# Patient Record
Sex: Female | Born: 1985 | Race: Black or African American | Hispanic: No | Marital: Single | Smoking: Current every day smoker
Health system: Southern US, Community
[De-identification: ages and names within clinical notes are randomized; demographics above are authoritative.]

## PROBLEM LIST (undated history)

## (undated) ENCOUNTER — Inpatient Hospital Stay (HOSPITAL_COMMUNITY): Payer: Self-pay

## (undated) DIAGNOSIS — B009 Herpesviral infection, unspecified: Secondary | ICD-10-CM

## (undated) DIAGNOSIS — F430 Acute stress reaction: Secondary | ICD-10-CM

## (undated) DIAGNOSIS — N920 Excessive and frequent menstruation with regular cycle: Secondary | ICD-10-CM

## (undated) DIAGNOSIS — N946 Dysmenorrhea, unspecified: Secondary | ICD-10-CM

## (undated) DIAGNOSIS — N760 Acute vaginitis: Secondary | ICD-10-CM

## (undated) DIAGNOSIS — A749 Chlamydial infection, unspecified: Secondary | ICD-10-CM

## (undated) DIAGNOSIS — N898 Other specified noninflammatory disorders of vagina: Secondary | ICD-10-CM

## (undated) DIAGNOSIS — B9689 Other specified bacterial agents as the cause of diseases classified elsewhere: Secondary | ICD-10-CM

## (undated) DIAGNOSIS — F411 Generalized anxiety disorder: Secondary | ICD-10-CM

## (undated) DIAGNOSIS — I10 Essential (primary) hypertension: Secondary | ICD-10-CM

## (undated) DIAGNOSIS — R5383 Other fatigue: Secondary | ICD-10-CM

## (undated) DIAGNOSIS — F329 Major depressive disorder, single episode, unspecified: Secondary | ICD-10-CM

## (undated) HISTORY — DX: Other specified noninflammatory disorders of vagina: N89.8

## (undated) HISTORY — DX: Essential (primary) hypertension: I10

## (undated) HISTORY — DX: Acute vaginitis: N76.0

## (undated) HISTORY — DX: Other specified bacterial agents as the cause of diseases classified elsewhere: B96.89

## (undated) HISTORY — DX: Major depressive disorder, single episode, unspecified: F32.9

## (undated) HISTORY — DX: Other fatigue: R53.83

## (undated) HISTORY — DX: Excessive and frequent menstruation with regular cycle: N92.0

## (undated) HISTORY — PX: DILATION AND CURETTAGE OF UTERUS: SHX78

## (undated) HISTORY — PX: CHOLECYSTECTOMY: SHX55

## (undated) HISTORY — DX: Dysmenorrhea, unspecified: N94.6

---
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
## 2008-01-06 ENCOUNTER — Emergency Department (HOSPITAL_COMMUNITY): Admission: EM | Admit: 2008-01-06 | Discharge: 2008-01-06 | Payer: Self-pay | Admitting: Emergency Medicine

## 2009-03-27 ENCOUNTER — Emergency Department (HOSPITAL_COMMUNITY): Admission: EM | Admit: 2009-03-27 | Discharge: 2009-03-27 | Payer: Self-pay | Admitting: Emergency Medicine

## 2009-06-28 ENCOUNTER — Emergency Department (HOSPITAL_COMMUNITY): Admission: EM | Admit: 2009-06-28 | Discharge: 2009-06-28 | Payer: Self-pay | Admitting: Emergency Medicine

## 2010-05-18 ENCOUNTER — Encounter: Payer: Self-pay | Admitting: Unknown Physician Specialty

## 2010-05-29 ENCOUNTER — Inpatient Hospital Stay (HOSPITAL_COMMUNITY)
Admission: AD | Admit: 2010-05-29 | Discharge: 2010-05-29 | Disposition: A | Discharge status: Home / Self Care | Payer: Self-pay | Source: Ambulatory Visit | Attending: Obstetrics and Gynecology | Admitting: Obstetrics and Gynecology

## 2010-05-29 DIAGNOSIS — O9989 Other specified diseases and conditions complicating pregnancy, childbirth and the puerperium: Secondary | ICD-10-CM

## 2010-05-29 DIAGNOSIS — Y92009 Unspecified place in unspecified non-institutional (private) residence as the place of occurrence of the external cause: Secondary | ICD-10-CM | POA: Insufficient documentation

## 2010-05-29 DIAGNOSIS — O99891 Other specified diseases and conditions complicating pregnancy: Secondary | ICD-10-CM | POA: Insufficient documentation

## 2010-05-29 DIAGNOSIS — W108XXA Fall (on) (from) other stairs and steps, initial encounter: Secondary | ICD-10-CM | POA: Insufficient documentation

## 2010-05-29 LAB — URINALYSIS, ROUTINE W REFLEX MICROSCOPIC
Bilirubin Urine: NEGATIVE
Ketones, ur: 15 mg/dL — AB
Nitrite: NEGATIVE
Protein, ur: NEGATIVE mg/dL
Specific Gravity, Urine: 1.025 (ref 1.005–1.030)
Urobilinogen, UA: 0.2 mg/dL (ref 0.0–1.0)
pH: 6 (ref 5.0–8.0)

## 2010-05-29 LAB — HCG, QUANTITATIVE, PREGNANCY: hCG, Beta Chain, Quant, S: 4096 m[IU]/mL — ABNORMAL HIGH (ref <5)

## 2010-07-05 ENCOUNTER — Ambulatory Visit (HOSPITAL_COMMUNITY)
Admission: RE | Admit: 2010-07-05 | Discharge: 2010-07-05 | Disposition: A | Discharge status: Home / Self Care | Payer: Medicaid Other | Source: Ambulatory Visit | Attending: Obstetrics and Gynecology | Admitting: Obstetrics and Gynecology

## 2010-07-05 ENCOUNTER — Other Ambulatory Visit: Payer: Self-pay | Admitting: Obstetrics and Gynecology

## 2010-07-05 DIAGNOSIS — O021 Missed abortion: Secondary | ICD-10-CM | POA: Insufficient documentation

## 2010-07-05 LAB — CBC
HCT: 34.3 % — ABNORMAL LOW (ref 36.0–46.0)
Hemoglobin: 11.8 g/dL — ABNORMAL LOW (ref 12.0–15.0)
MCH: 26 pg (ref 26.0–34.0)
MCHC: 34.4 g/dL (ref 30.0–36.0)
MCV: 75.6 fL — ABNORMAL LOW (ref 78.0–100.0)
Platelets: 343 10*3/uL (ref 150–400)
RBC: 4.54 MIL/uL (ref 3.87–5.11)
RDW: 14.3 % (ref 11.5–15.5)
WBC: 5.6 10*3/uL (ref 4.0–10.5)

## 2010-07-06 LAB — RH IMMUNE GLOBULIN WORKUP (NOT WOMEN'S HOSP): Unit division: 0

## 2010-07-19 NOTE — Op Note (Signed)
  NAMEBRISEIDA, Ruth Mullen              ACCOUNT NO.:  [id number]  MEDICAL RECORD NO.:  [id number]           PATIENT TYPE:  O  LOCATION:  [facility]                          [facility]AC[facility]  PHYSICIAN:  Juluis Mire, M.D.   DATE OF BIRTH:  [birth year]  DATE OF PROCEDURE:  07/05/2010 DATE OF DISCHARGE:                              OPERATIVE REPORT   PREOPERATIVE DIAGNOSIS:  Nonviable first trimester pregnancy.  POSTOPERATIVE DIAGNOSIS:  Nonviable first trimester pregnancy.  PROCEDURE:  Paracervical block with dilatation and evacuation.  SURGEON:  Juluis Mire, MD.  ANESTHESIA:  MAC with paracervical block.  ESTIMATED BLOOD LOSS:  About 100 mL.  PACKS AND DRAINS:  None.  INTRAOPERATIVE BLOOD PLACED:  None.  COMPLICATIONS:  None.  INDICATIONS:  As dictated in the history and physical.  PROCEDURE:  The patient was taken to the OR and placed in supine position.  After satisfactory level of sedation, the patient placed in dorsal lithotomy position using the Allen stirrups.  At this point in time, the patient draped in sterile field.  Spec was placed in the vaginal vault.  The cervix was generally cleansed out with Betadine. Cervix was grasped with a single-tooth tenaculum.  A paracervical block was ensued using was 1% Nesacaine.  Uterus sounded to approximate 11 cm. Cervix serially dilated to a size 27 Pratt dilator.  A size 8 curved suction curette was introduced.  The intrauterine cavity was evacuated using suction curetting.  This was continued until no additional tissue was obtained.  We then sharply curetted feeling that all quadrants were clear with a gritty feel.  Repeat suction curetting revealed no additional tissue.  Uterus was contracting down well.  Bleeding was minimal.  At this point in time, single-tooth tenaculum speculum then removed.  The patient was taken out of the dorsal lithotomy position.  Once alert, transferred to recovery room in good condition.  The patient  will be given RhoGAM in recovery room.  Sponge, instrument, and needle count reported as correct by circulating nurse.     Juluis Mire, M.D.     JSM/MEDQ  D:  07/05/2010  T:  07/05/2010  Job:  [id number]  Electronically Signed by Richardean Chimera M.D. on 07/19/2010 06:05:32 AM

## 2010-07-19 NOTE — H&P (Signed)
  NAMENARMEEN, KERPER NO.:  [id number]  MEDICAL RECORD NO.:  [id number]           PATIENT TYPE:  LOCATION:                                 FACILITY:  PHYSICIAN:  Juluis Mire, M.D.   DATE OF BIRTH:  May 12, 1985  DATE OF ADMISSION:  07/03/2010 DATE OF DISCHARGE:                             HISTORY & PHYSICAL   The patient is a 25 year old gravida 3, para 1, abortus 1 female presents for D and E.  In relation to the present admission, the patient was sent in for new OB workup on July 02, 2010.  Fetal heart tones were not audible. Subsequent ultrasound revealed an intrauterine pregnancy with no cardiac activity.  Crown-rump length was consistent with 8 weeks and 1 day. Therefore, she was diagnosed with a nonviable first trimester pregnancy, presents for D and E.  The patient's blood type is B negative.  RhoGAM will be required.  In terms of allergies, the patient has no known no known drug allergies.  MEDICATIONS:  Prenatal vitamins.  PAST MEDICAL HISTORY:  Usual childhood disease.  No sequelae.  PAST SURGICAL HISTORY:  She has had her gallbladder removed.  She has had one vaginal delivery, one TAB.  SOCIAL HISTORY:  No tobacco or alcohol use.  FAMILY HISTORY:  Noncontributory.  REVIEW OF SYSTEMS:  Noncontributory.  PHYSICAL EXAM:  VITAL SIGNS:  Stable.  The patient is afebrile. HEENT:  The patient is normocephalic.  Pupils equal, round, reactive to light, and accommodation.  Extraocular movements were intact.  Sclerae and conjunctivae are clear.  Oropharynx is clear. NECK:  No thyromegaly. BREASTS:  No dominant masses noted. LUNGS:  Clear. CARDIOVASCULAR:  Regular rate.  There are no murmurs or gallops. ABDOMEN:  Benign.  No mass, organomegaly, or tenderness. PELVIC:  Normal external genitalia.  Vaginal mucosa is clear.  Cervix unremarkable.  Uterus 9 weeks in size.  Adnexa unremarkable. EXTREMITIES:  Trace edema. NEUROLOGIC:  Grossly within  normal limits.  IMPRESSION:  Nonviable first trimester pregnancy.  PLAN:  The patient will undergo dilatation and evacuation.  The risks of surgery have been discussed including the risk of infection.  Risk of hemorrhage that could require transfusion with the risk of AIDS or hepatitis.  Excessive bleeding could require hysterectomy. There is a risk of injury to adjacent organs through perforation that could require further exploratory surgery.  Risk of deep venous thrombosis and pulmonary embolus.  The patient does understand indications and potential risks.     Juluis Mire, M.D.     JSM/MEDQ  D:  07/03/2010  T:  07/03/2010  Job:  [id number]  Electronically Signed by Richardean Chimera M.D. on 07/19/2010 06:05:30 AM

## 2010-07-20 LAB — RAPID STREP SCREEN (MED CTR MEBANE ONLY): Streptococcus, Group A Screen (Direct): NEGATIVE

## 2010-07-27 DEATH — deceased

## 2010-07-29 LAB — URINALYSIS, ROUTINE W REFLEX MICROSCOPIC
Glucose, UA: NEGATIVE mg/dL
Ketones, ur: >80 mg/dL — AB
Leukocytes, UA: NEGATIVE
Nitrite: NEGATIVE
Specific Gravity, Urine: >1.03 — ABNORMAL HIGH (ref 1.005–1.030)
Urobilinogen, UA: 0.2 mg/dL (ref 0.0–1.0)
pH: 6 (ref 5.0–8.0)

## 2010-07-29 LAB — DIFFERENTIAL
Basophils Absolute: 0 10*3/uL (ref 0.0–0.1)
Basophils Relative: 1 % (ref 0–1)
Eosinophils Absolute: 0 10*3/uL (ref 0.0–0.7)
Eosinophils Relative: 1 % (ref 0–5)
Lymphs Abs: 2.5 10*3/uL (ref 0.7–4.0)
Neutrophils Relative %: 51 % (ref 43–77)

## 2010-07-29 LAB — URINE MICROSCOPIC-ADD ON

## 2010-07-29 LAB — GLUCOSE, CAPILLARY: Glucose-Capillary: 117 mg/dL — ABNORMAL HIGH (ref 70–99)

## 2010-07-29 LAB — COMPREHENSIVE METABOLIC PANEL
ALT: 11 U/L (ref 0–35)
AST: 20 U/L (ref 0–37)
CO2: 26 mEq/L (ref 19–32)
Calcium: 9.3 mg/dL (ref 8.4–10.5)
Chloride: 101 mEq/L (ref 96–112)
GFR calc Af Amer: >60 mL/min (ref >60)
GFR calc non Af Amer: >60 mL/min (ref >60)
Glucose, Bld: 65 mg/dL — ABNORMAL LOW (ref 70–99)
Sodium: 137 mEq/L (ref 135–145)
Total Bilirubin: 1.2 mg/dL (ref 0.3–1.2)

## 2010-07-29 LAB — CBC
Hemoglobin: 13.1 g/dL (ref 12.0–15.0)
MCHC: 33.9 g/dL (ref 30.0–36.0)
MCV: 77.4 fL — ABNORMAL LOW (ref 78.0–100.0)
RBC: 4.99 MIL/uL (ref 3.87–5.11)
WBC: 5.7 10*3/uL (ref 4.0–10.5)

## 2010-07-29 LAB — WET PREP, GENITAL
Trich, Wet Prep: NONE SEEN
Yeast Wet Prep HPF POC: NONE SEEN

## 2010-07-29 LAB — PREGNANCY, URINE: Preg Test, Ur: NEGATIVE

## 2010-07-29 LAB — GC/CHLAMYDIA PROBE AMP, GENITAL
Chlamydia, DNA Probe: NEGATIVE
GC Probe Amp, Genital: NEGATIVE

## 2010-09-12 NOTE — Op Note (Signed)
NAMEDAVIS, VANNATTER              ACCOUNT NO.:  [id number]   MEDICAL RECORD NO.:  [id number]          PATIENT TYPE:  INP   LOCATION:  1517                         FACILITY:  [facility]   PHYSICIAN:  Thornton Park. Daphine Deutscher, MD  DATE OF BIRTH:  1985/11/29   DATE OF PROCEDURE:  06/03/2005  DATE OF DISCHARGE:                                 OPERATIVE REPORT   PREOPERATIVE DIAGNOSES:  Cholecystitis, cholelithiasis.   POSTOPERATIVE DIAGNOSES:  Cholecystitis, cholelithiasis.   PROCEDURE:  Laparoscopic cholecystectomy with intraoperative cholangiogram.   SURGEON:  Thornton Park. Daphine Deutscher, MD.   ASSISTANT:  Leonie Man, MD.   ANESTHESIA:  General endotracheal.   ESTIMATED BLOOD LOSS:  Minimal.   DESCRIPTION OF PROCEDURE:  The patient was taken to room 11 on the evening  of June 03, 2005, given general anesthesia. The abdomen was prepped with  chlorhexidine and draped sterilely. A longitudinal incision was made down to  the umbilicus which I had anesthetized with lidocaine and I entered the  abdomen without difficulty. A suture of #0 Vicryl was placed to hold the  Hassan cannula in place. After insufflation, anesthesia was used in each of  the wounds and then a 10 mm placed in the upper midline and two 5s  laterally. The gallbladder was grasped and elevated. I used a hook  electrocautery to dissect free Calot's triangle. She had a modest degree of  inflammatory changes here that appeared chronic. I skeletonized the cystic  duct and the cystic artery and put clips upon the cystic artery and the  cystic duct at the cystic gallbladder junction. I incised the cystic duct  then inserted the Reddick catheter and did a dynamic cholangiogram which  showed good filling of the intrahepatic radicals and free flow into the  duodenum. I triple clipped the cystic duct, divided it and then removed the  gallbladder from the gallbladder bed using hook electrocautery without  entering it. It was then brought out  through the umbilicus in a bag. The  umbilical defect was repaired with two horizontal simple sutures of #0  Vicryl. This was done under laparoscopic vision. The gallbladder bed was  irrigated and no bleeding or bile leaks were noted. The trocars were all  withdrawn and the wounds were closed with 4-0 Vicryl, Benzoin and Steri-  Strips. The patient tolerated the procedure well and was taken to the  recovery room in satisfactory condition.      Thornton Park Daphine Deutscher, MD  Electronically Signed     MBM/MEDQ  D:  06/03/2005  T:  06/04/2005  Job:  [id number]

## 2010-09-12 NOTE — H&P (Signed)
Ruth Mullen, Ruth Mullen              ACCOUNT NO.:  [id number]   MEDICAL RECORD NO.:  [id number]          PATIENT TYPE:  INP   LOCATION:  1517                         FACILITY:  [facility]   PHYSICIAN:  Velora Heckler, MD      DATE OF BIRTH:  1985-11-02   DATE OF ADMISSION:  06/01/2005  DATE OF DISCHARGE:                                HISTORY & PHYSICAL   CHIEF COMPLAINT:  Abdominal pain, nausea.   HISTORY OF PRESENT ILLNESS:  Ruth Mullen is a black female from  [place], [place], who presents to the emergency department  accompanied by friends and multiple family members for intermittent  abdominal pain. The patient has a 86-month history of intermittent right  upper quadrant abdominal pain, nausea, and fever. She was initially seen by  her primary physician and started on Protonix. She did not have symptomatic  relief. She has been in and out of the emergency departments both in  [place] and in [place]. The patient contacted our office this morning  for evaluation at the instructions of the emergency department. Due to the  acuity of her symptoms, she was referred to [facility] Room where  she is now seen by surgery for evaluation of known cholelithiasis. The  patient has had a previous ultrasound of the abdomen. This was performed on  February2,2007, at [facility]. It documented multiple  small gallstones. There was slight thickening of the gallbladder wall of 3  mm. There was no pericholecystic fluid. There was no biliary dilatation. The  patient now presents with persistent abdominal pain for 5 days, and surgery  is called to evaluate and manage.   PAST MEDICAL HISTORY:  History of childbirth in 2006, no prior surgery.   MEDICATIONS:  Protonix 40 mg daily.   ALLERGIES:  No known drug allergies.   SOCIAL HISTORY:  The patient denies tobacco or alcohol use. She is  accompanied by mother, her grandmother, and her boyfriend. She also has  her  38-month-old child with her. She denies tobacco use. She denies alcohol use.  She is unemployed.   FAMILY HISTORY:  Noncontributory. No history of biliary disease or  gallbladder surgery in any family members.   REVIEW OF SYSTEMS:  A 15-system review discussed with the patient and family  without significant other findings.   PHYSICAL EXAMINATION:  GENERAL:  25 year old well-developed, well-nourished  black female on a stretcher. Ill.  VITAL SIGNS: Temperature 99, pulse 71, respirations 18, blood pressure  100/67.  HEENT: Shows to be normocephalic, atraumatic. Sclerae clear. Conjunctiva  clear. Pupils equal and reactive. Dentition fair. Mucous membranes moist.  Voice normal.  NECK: Palpation of the neck shows no thyroid nodularity. There is no  lymphadenopathy. There are no masses. There is no tenderness.  LUNGS: Clear to auscultation bilaterally, although the patient does  experience right upper quadrant abdominal pain with deep inspiration.  CARDIAC:  Exam shows regular rate and rhythm without murmur. Peripheral  pulses are full.  ABDOMEN: Soft without distension. Bowel sounds are present. There are no  surgical wounds. There are some striae on the  lower abdominal wall given a  history of childbirth. There is mild tenderness to palpation in the right  upper quadrant. There is no palpable mass. There is no pedal splenomegaly.  There is no obvious Murphy's sign present.  EXTREMITIES:  Nontender without edema.  NEUROLOGICALLY:  The patient is alert and oriented without focal neurologic  deficit.   LABORATORY STUDIES:  White count 4.9, hemoglobin 14.9, platelet count  361,000. Differential was notable for a low neutrophil count of 26%,  elevated lymphocyte count of 67%. Electrolytes were normal. Renal function  is normal. Liver function test show normal total bilirubin of 1.2 and  slightly elevated SGPT of 54 and normal. Lipase level of 24.   RADIOGRAPHIC STUDIES:  Ultrasound  from [place] showing multiple gallstones  and slight thickening of the gallbladder wall.   IMPRESSION:  Biliary colic, cholelithiasis.   PLAN:  1.  Admission to [facility].  2.  Preparation for surgery.  3.  To operating room on February6 for cholecystectomy with intraoperative      cholangiography.  4.  Routine postoperative care.      Velora Heckler, MD  Electronically Signed     TMG/MEDQ  D:  06/01/2005  T:  06/01/2005  Job:  [id number]   cc:   Marcene Duos, M.D.  Fax: [phone]

## 2010-09-12 NOTE — Discharge Summary (Signed)
Ruth Mullen, Ruth Mullen              ACCOUNT NO.:  [id number]   MEDICAL RECORD NO.:  [id number]          PATIENT TYPE:  INP   LOCATION:  1517                         FACILITY:  [facility]   PHYSICIAN:  Thornton Park. Daphine Deutscher, MD  DATE OF BIRTH:  07-Aug-1985   DATE OF ADMISSION:  06/01/2005  DATE OF DISCHARGE:  06/04/2005                                 DISCHARGE SUMMARY   ADMISSION DIAGNOSES:  1.  Abdominal pain.  2.  Nausea, vomiting.  3.  Gallstones.   DISCHARGE DIAGNOSES:  Chronic cholecystitis with multiple gallstones.   PROCEDURES:  Laparoscopic cholecystectomy, intraoperative cholangiogram on  June 03, 2005, by Dr. Rolan Bucco.   HOSPITAL COURSE:  This is a 25 year old black female who presented with a  four month history of intermittent right upper quadrant abdominal pain,  nausea and fever.  She had gallstones seen on an ultrasound.  She was  admitted and attempts were made to try to get her to the operating room, but  because of OR availability, that was not done initially.  This was delayed  until June 03, 2005, at which time on the evening of June 03, 2005,  she underwent a laparoscopic cholecystectomy with intraoperative  cholangiogram by Dr. Daphine Deutscher.  She was doing well on June 04, 2005, and  ready for discharge.  She was given a prescription for Vicodin (25) to take  for pain, and asked to return to the office in two weeks.   CONDITION ON DISCHARGE:  Good.   FINAL DIAGNOSES:  Chronic cholecystitis/cholelithiasis.      Thornton Park Daphine Deutscher, MD  Electronically Signed     MBM/MEDQ  D:  06/04/2005  T:  06/04/2005  Job:  [id number]   cc:   Marcene Duos, M.D.  Fax: [phone]

## 2010-10-20 ENCOUNTER — Emergency Department (HOSPITAL_COMMUNITY)
Admission: EM | Admit: 2010-10-20 | Discharge: 2010-10-20 | Disposition: A | Discharge status: Home / Self Care | Payer: Medicaid Other | Attending: Emergency Medicine | Admitting: Emergency Medicine

## 2010-10-20 DIAGNOSIS — R296 Repeated falls: Secondary | ICD-10-CM | POA: Insufficient documentation

## 2010-10-20 DIAGNOSIS — IMO0002 Reserved for concepts with insufficient information to code with codable children: Secondary | ICD-10-CM | POA: Insufficient documentation

## 2010-12-05 ENCOUNTER — Emergency Department (HOSPITAL_COMMUNITY)
Admission: EM | Admit: 2010-12-05 | Discharge: 2010-12-05 | Disposition: A | Discharge status: Home / Self Care | Payer: Medicaid Other | Attending: Emergency Medicine | Admitting: Emergency Medicine

## 2010-12-05 DIAGNOSIS — F1010 Alcohol abuse, uncomplicated: Secondary | ICD-10-CM | POA: Insufficient documentation

## 2010-12-05 LAB — DIFFERENTIAL
Basophils Absolute: 0 10*3/uL (ref 0.0–0.1)
Basophils Relative: 1 % (ref 0–1)
Eosinophils Absolute: 0 10*3/uL (ref 0.0–0.7)
Eosinophils Relative: 1 % (ref 0–5)
Lymphocytes Relative: 48 % — ABNORMAL HIGH (ref 12–46)
Monocytes Absolute: 0.2 10*3/uL (ref 0.1–1.0)

## 2010-12-05 LAB — URINALYSIS, ROUTINE W REFLEX MICROSCOPIC
Hgb urine dipstick: NEGATIVE
Nitrite: NEGATIVE
Protein, ur: NEGATIVE mg/dL
Specific Gravity, Urine: 1.015 (ref 1.005–1.030)
Urobilinogen, UA: 1 mg/dL (ref 0.0–1.0)

## 2010-12-05 LAB — POCT PREGNANCY, URINE: Preg Test, Ur: NEGATIVE

## 2010-12-05 LAB — BASIC METABOLIC PANEL
CO2: 23 mEq/L (ref 19–32)
Calcium: 9.1 mg/dL (ref 8.4–10.5)
Creatinine, Ser: 0.61 mg/dL (ref 0.50–1.10)
GFR calc non Af Amer: >60 mL/min (ref >60)
Glucose, Bld: 106 mg/dL — ABNORMAL HIGH (ref 70–99)

## 2010-12-05 LAB — CBC
HCT: 37.2 % (ref 36.0–46.0)
MCHC: 34.9 g/dL (ref 30.0–36.0)
Platelets: 330 10*3/uL (ref 150–400)
RDW: 14 % (ref 11.5–15.5)
WBC: 6.6 10*3/uL (ref 4.0–10.5)

## 2010-12-05 LAB — RAPID URINE DRUG SCREEN, HOSP PERFORMED
Barbiturates: NOT DETECTED
Opiates: NOT DETECTED
Tetrahydrocannabinol: POSITIVE — AB

## 2010-12-05 LAB — ETHANOL: Alcohol, Ethyl (B): 168 mg/dL — ABNORMAL HIGH (ref 0–11)

## 2010-12-31 IMAGING — US US TRANSVAGINAL NON-OB
1 series · 14 of 25 positions shown · non-contrast
Comparison: None.

CLINICAL DATA: Lower abdominal pain and heavy vaginal bleeding.

TRANSVAGINAL ULTRASOUND OF PELVIS,ULTRASOUND PELVIS COMPLETE -
MODIFY
TECHNIQUE: Routine.

[Series 1: us transvaginal non-ob · 0.20mm/px · 14 of 49 slices shown]
[im 1/49]
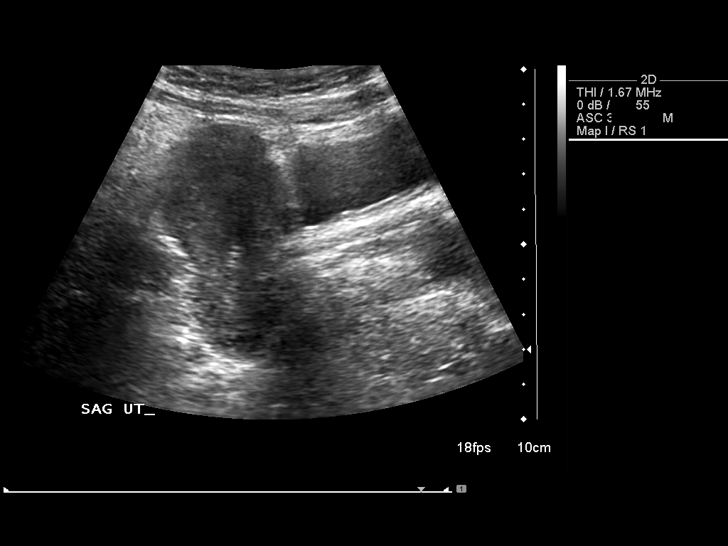
[im 5/49]
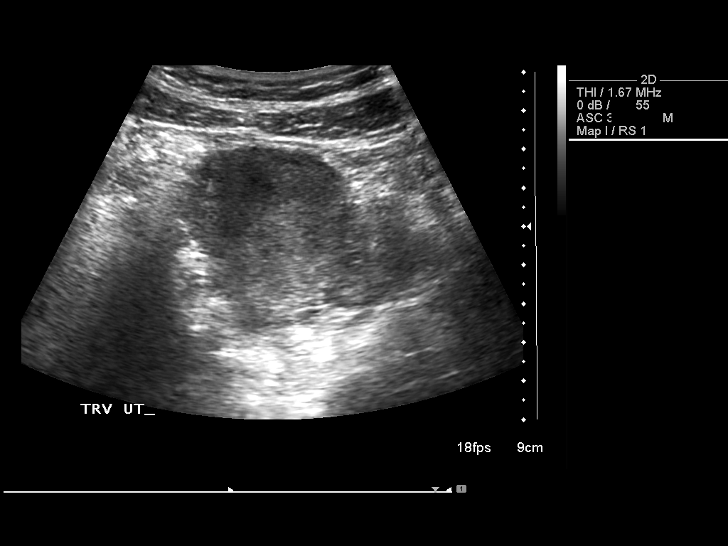
[im 9/49]
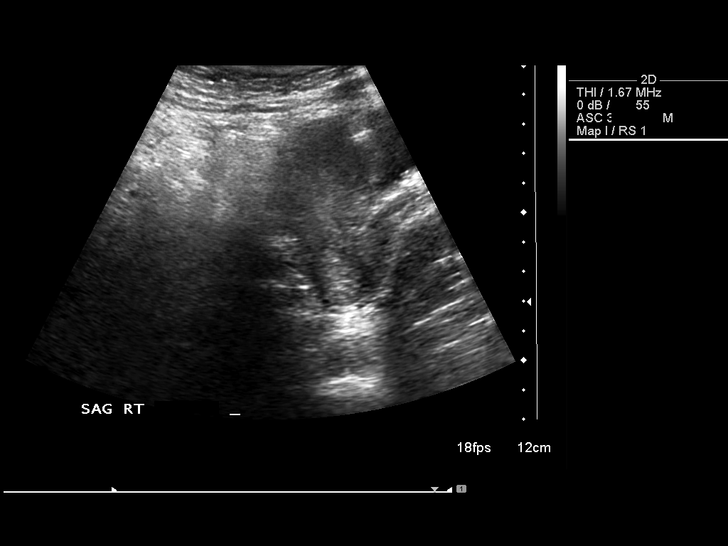
[im 13/49]
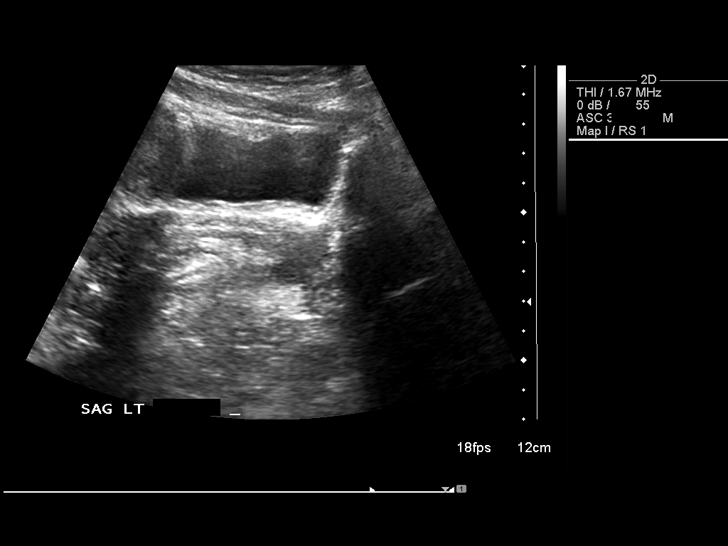
[im 17/49]
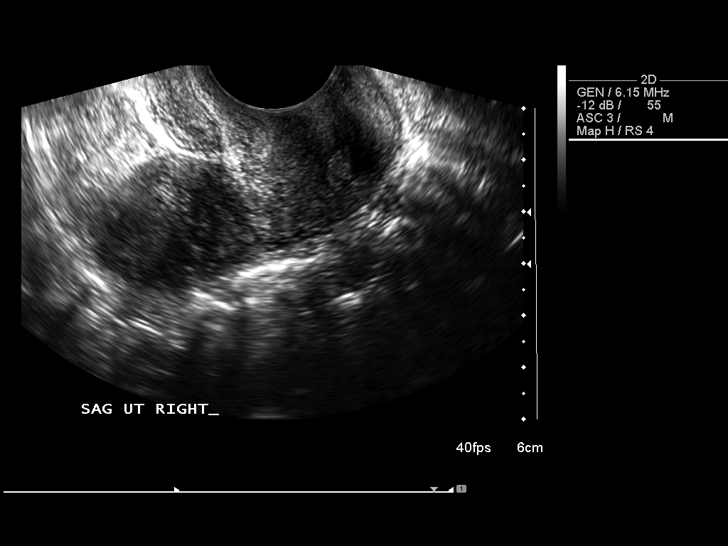
[im 19/49]
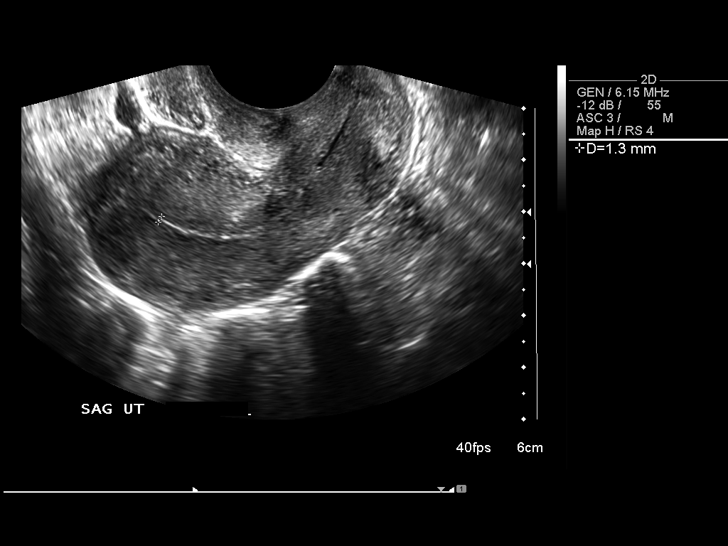
[im 23/49]
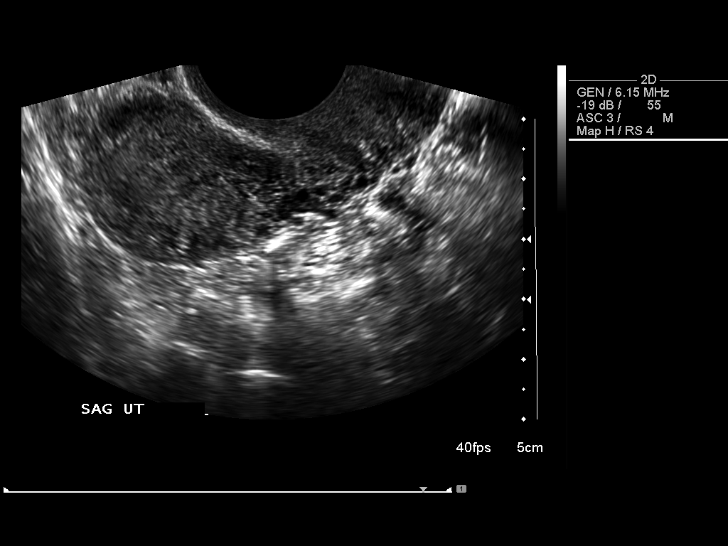
[im 27/49]
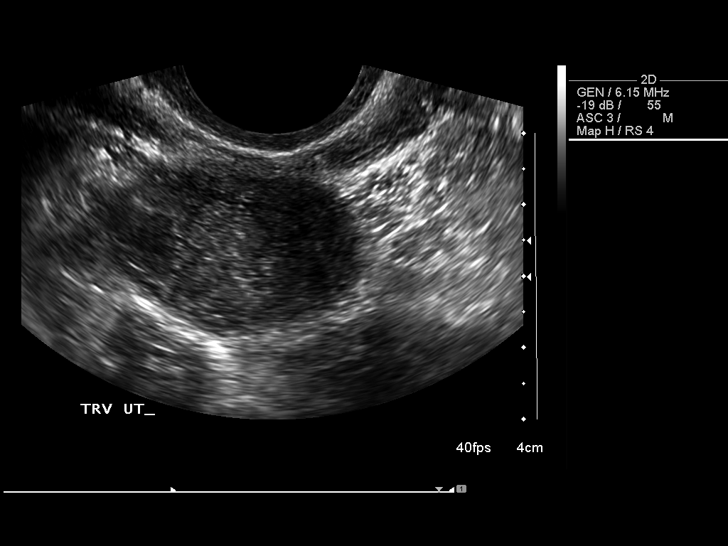
[im 31/49]
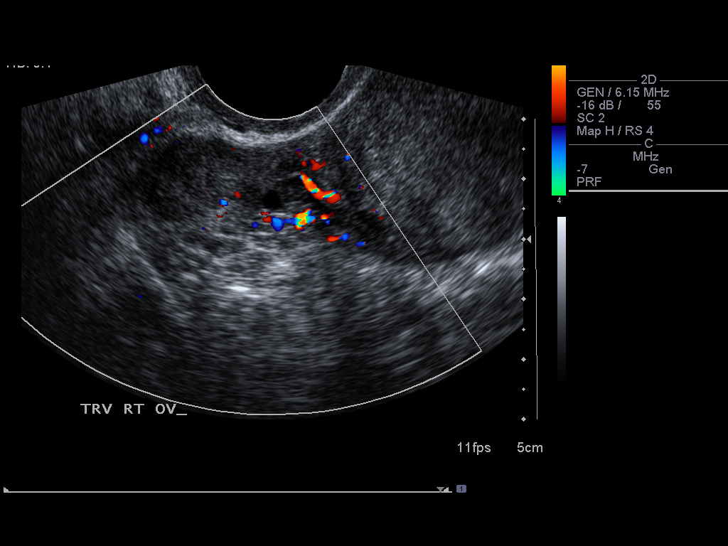
[im 33/49]
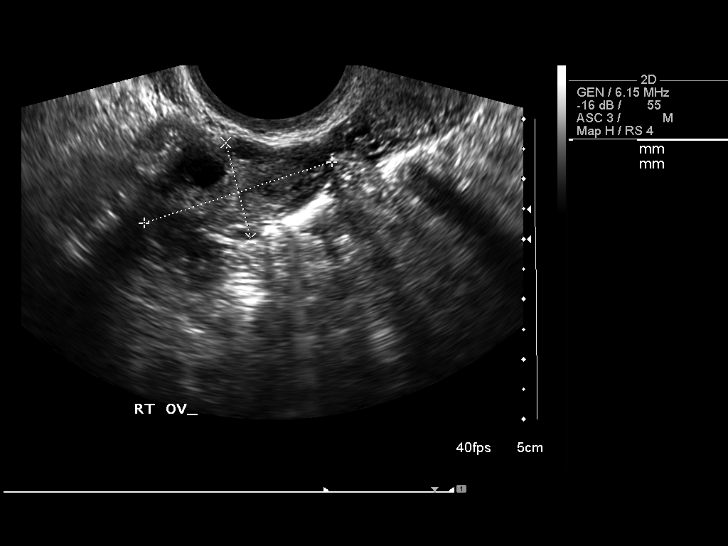
[im 37/49]
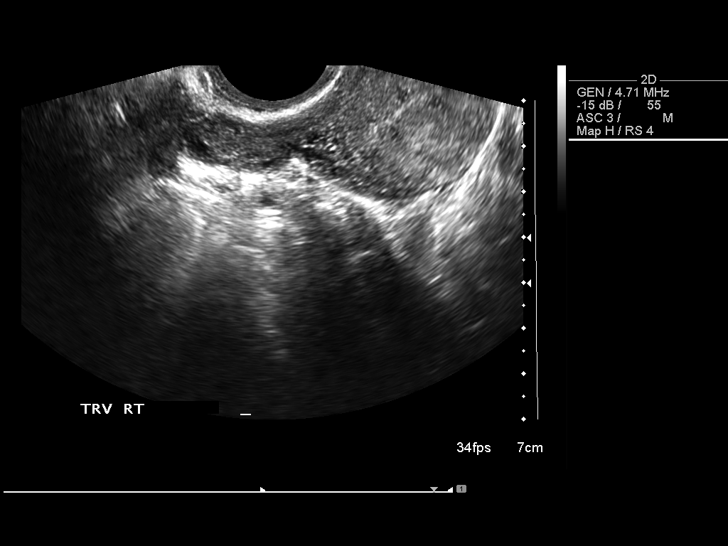
[im 41/49]
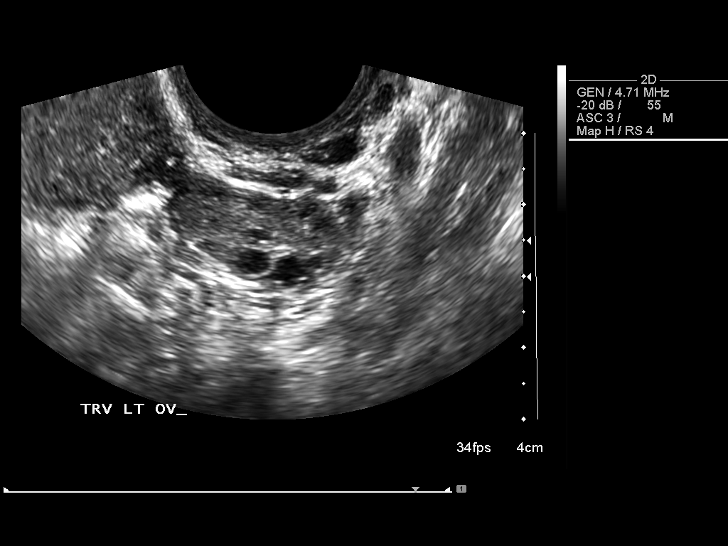
[im 45/49]
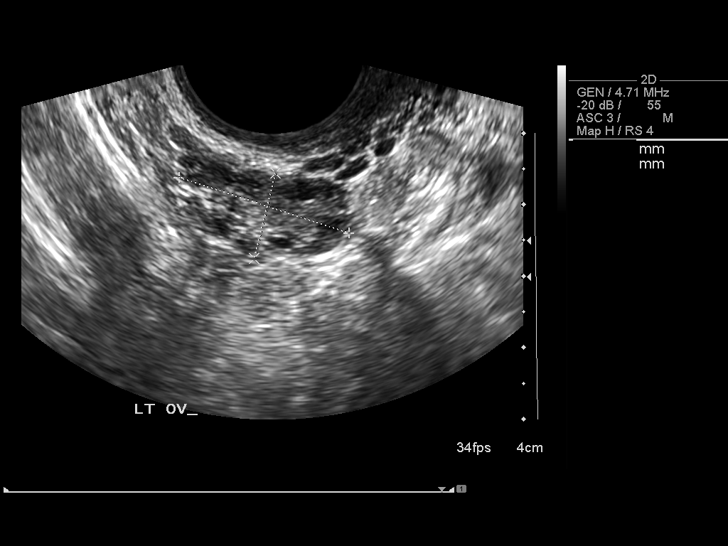
[im 49/49]
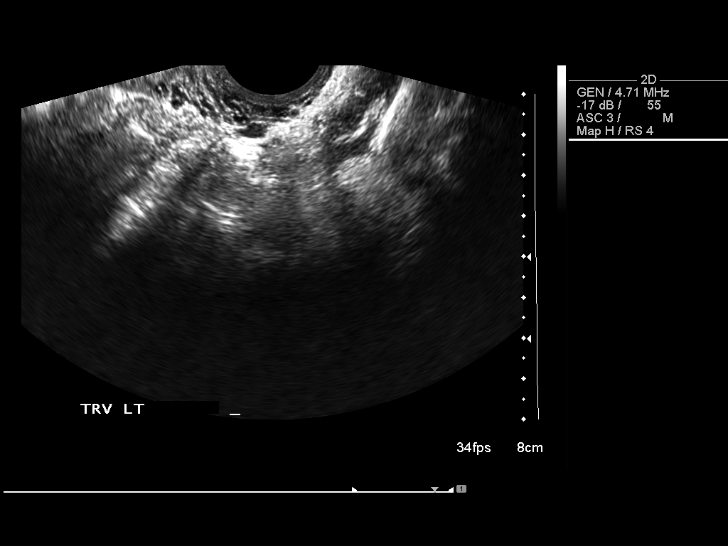

[14 of 25 positions shown; findings below may reference images not displayed]

FINDINGS: Uterine size and contour normal.  No lesions of the
myometrium.

Endometrium is thin and homogeneous.  There is a small amount of
fluid in the endocervical canal, which is likely due to vaginal
bleeding.

The right ovary is 3.3 x 1.7 x 2.8 cm.  The left is 2.5 x 1.2 x
cm.  There are bilateral ovarian follicles with no dominant cysts
or solid masses.  No pelvic fluid is identified.
IMPRESSION: Normal except for a small amount of endocervical fluid, likely due
to vaginal bleeding.

## 2011-01-28 LAB — URINALYSIS, ROUTINE W REFLEX MICROSCOPIC
Protein, ur: 100 — AB
Specific Gravity, Urine: 1.02
Urobilinogen, UA: 0.2

## 2011-01-28 LAB — URINE MICROSCOPIC-ADD ON

## 2011-02-06 LAB — URINALYSIS, ROUTINE W REFLEX MICROSCOPIC
Bilirubin Urine: NEGATIVE
Glucose, UA: NEGATIVE
Hgb urine dipstick: NEGATIVE
Ketones, ur: NEGATIVE
pH: 8

## 2012-01-12 ENCOUNTER — Inpatient Hospital Stay (HOSPITAL_COMMUNITY)
Admission: AD | Admit: 2012-01-12 | Discharge: 2012-01-13 | Disposition: A | Discharge status: Home / Self Care | Payer: Self-pay | Source: Ambulatory Visit | Attending: Obstetrics and Gynecology | Admitting: Obstetrics and Gynecology

## 2012-01-12 ENCOUNTER — Encounter (HOSPITAL_COMMUNITY): Payer: Self-pay | Admitting: *Deleted

## 2012-01-12 DIAGNOSIS — Z202 Contact with and (suspected) exposure to infections with a predominantly sexual mode of transmission: Secondary | ICD-10-CM | POA: Insufficient documentation

## 2012-01-12 DIAGNOSIS — A5901 Trichomonal vulvovaginitis: Secondary | ICD-10-CM | POA: Insufficient documentation

## 2012-01-12 DIAGNOSIS — N949 Unspecified condition associated with female genital organs and menstrual cycle: Secondary | ICD-10-CM | POA: Insufficient documentation

## 2012-01-12 LAB — URINE MICROSCOPIC-ADD ON

## 2012-01-12 LAB — URINALYSIS, ROUTINE W REFLEX MICROSCOPIC
Bilirubin Urine: NEGATIVE
Glucose, UA: NEGATIVE mg/dL
Nitrite: NEGATIVE
Specific Gravity, Urine: 1.01 (ref 1.005–1.030)
pH: 7 (ref 5.0–8.0)

## 2012-01-12 LAB — WET PREP, GENITAL: Yeast Wet Prep HPF POC: NONE SEEN

## 2012-01-12 NOTE — MAU Note (Signed)
Pt reports bumps on vaginal area today, discharge x 3 weeks, clear watery discharge. LMP 12/23/2011

## 2012-01-12 NOTE — MAU Note (Signed)
House coverage out to speak with pt about delay. Pt okay

## 2012-01-13 DIAGNOSIS — N9489 Other specified conditions associated with female genital organs and menstrual cycle: Secondary | ICD-10-CM

## 2012-01-13 LAB — GC/CHLAMYDIA PROBE AMP, GENITAL: GC Probe Amp, Genital: NEGATIVE

## 2012-01-13 MED ORDER — CEFTRIAXONE SODIUM 250 MG IJ SOLR
250.0000 mg | Freq: Once | INTRAMUSCULAR | Status: AC
  Administered 2012-01-13: 250 mg via INTRAMUSCULAR
  Filled 2012-01-13: qty 250

## 2012-01-13 MED ORDER — METRONIDAZOLE 500 MG PO TABS
2000.0000 mg | ORAL_TABLET | Freq: Once | ORAL | Status: AC
  Administered 2012-01-13: 2000 mg via ORAL
  Filled 2012-01-13: qty 4

## 2012-01-13 MED ORDER — AZITHROMYCIN 250 MG PO TABS
1000.0000 mg | ORAL_TABLET | Freq: Once | ORAL | Status: AC
  Administered 2012-01-13: 1000 mg via ORAL
  Filled 2012-01-13: qty 4

## 2012-01-13 MED ORDER — VALACYCLOVIR HCL 1 G PO TABS: 1000.0000 mg | ORAL_TABLET | Freq: Two times a day (BID) | ORAL | Status: DC

## 2012-01-13 MED ORDER — ONDANSETRON HCL 4 MG PO TABS
4.0000 mg | ORAL_TABLET | Freq: Once | ORAL | Status: AC
  Administered 2012-01-13: 4 mg via ORAL
  Filled 2012-01-13: qty 1

## 2012-01-13 NOTE — MAU Provider Note (Signed)
Chief Complaint: Vaginal Discharge   First Provider Initiated Contact with Patient 01/12/12 2311     SUBJECTIVE HPI: Ruth Mullen is a 26 y.o. [id number] who presents to MAU reporting  bumps on vaginal area today, discharge x 3 weeks, clear watery discharge. LMP 12/23/2011. Denies fever, chills, abd pain, burning or tingling of bumps. Pt shaves genital area. States bumps hurt when her underwear rub. Patient is sexually active.  Past Medical History  Diagnosis Date  . No pertinent past medical history    OB History    Grav Para Term Preterm Abortions TAB SAB Ect Mult Living   3 1   2 1 1   1      # Outc Date GA Lbr Len/2nd Wgt Sex Del Anes PTL Lv   1 PAR            2 SAB            3 TAB              Past Surgical History  Procedure Date  . Dilation and curettage of uterus   . Cholecystectomy    History   Social History  . Marital Status: Single    Spouse Name: N/A    Number of Children: N/A  . Years of Education: N/A   Occupational History  . Not on file.   Social History Main Topics  . Smoking status: Current Every Day Smoker  . Smokeless tobacco: Not on file  . Alcohol Use: No  . Drug Use: No  . Sexually Active: Yes   Other Topics Concern  . Not on file   Social History Narrative  . No narrative on file   No current facility-administered medications on file prior to encounter.   No current outpatient prescriptions on file prior to encounter.   No Known Allergies  ROS: Pertinent items in HPI  OBJECTIVE Blood pressure 139/84, pulse 86, temperature 98.7 F (37.1 C), temperature source Oral, resp. rate 16, height 5' (1.524 m), weight 52.164 kg (115 lb), last menstrual period 12/23/2011, SpO2 100.00%. GENERAL: Well-developed, well-nourished female in no acute distress.  HEENT: Normocephalic HEART: normal rate RESP: normal effort ABDOMEN: Soft, non-tender EXTREMITIES: Nontender, no edema NEURO: Alert and oriented SPECULUM EXAM: Approximately 8 intact,  tender vesicles on bilateral labia minora, large amount of thin, malodorous, pale yellow discharge, no blood noted, cervix friable. No mucopurulent discharge noted. BIMANUAL: cervix closed; uterus normal size, no CMT, adnexal tenderness or masses  LAB RESULTS Results for orders placed during the hospital encounter of 01/12/12 (from the past 24 hour(s))  URINALYSIS, ROUTINE W REFLEX MICROSCOPIC     Status: Abnormal   Collection Time   01/12/12  7:40 PM      Component Value Range   Color, Urine YELLOW  YELLOW   APPearance CLEAR  CLEAR   Specific Gravity, Urine 1.010  1.005 - 1.030   pH 7.0  5.0 - 8.0   Glucose, UA NEGATIVE  NEGATIVE mg/dL   Hgb urine dipstick TRACE (*) NEGATIVE   Bilirubin Urine NEGATIVE  NEGATIVE   Ketones, ur NEGATIVE  NEGATIVE mg/dL   Protein, ur NEGATIVE  NEGATIVE mg/dL   Urobilinogen, UA 0.2  0.0 - 1.0 mg/dL   Nitrite NEGATIVE  NEGATIVE   Leukocytes, UA SMALL (*) NEGATIVE  URINE MICROSCOPIC-ADD ON     Status: Normal   Collection Time   01/12/12  7:40 PM      Component Value Range   Squamous Epithelial /  LPF RARE  RARE   WBC, UA 3-6  <3 WBC/hpf   RBC / HPF 0-2  <3 RBC/hpf   Bacteria, UA RARE  RARE  POCT PREGNANCY, URINE     Status: Normal   Collection Time   01/12/12  8:23 PM      Component Value Range   Preg Test, Ur NEGATIVE  NEGATIVE  WET PREP, GENITAL     Status: Abnormal   Collection Time   01/12/12 11:06 PM      Component Value Range   Yeast Wet Prep HPF POC NONE SEEN  NONE SEEN   Trich, Wet Prep FEW (*) NONE SEEN   Clue Cells Wet Prep HPF POC MODERATE (*) NONE SEEN   WBC, Wet Prep HPF POC MODERATE (*) NONE SEEN    IMAGING No results found.  ED COURSE Discussed diagnosis of Trichomonas and possible exposure to other sexual transmitted diseases. Vulvar lesions suspicious for herpes. Patient would like prophylactic treatment for gonorrhea and Chlamydia. Flagyl, and azithromycin, Rocephin and Zofran given.  ASSESSMENT 1. Genital lesion, female     2. Trichomonal vaginitis   3. Exposure to STD    PLAN Discharge home Herpes culture,, gonorrhea and Chlamydia cultures pending. No intercourse until one week after patient and partner treated. Always use condoms. Additional STD testing [facility] be performed at [facility] or [facility]. Follow-up Information    Follow up with [facility] [place], [place].. (As needed if symptoms worsen)    Contact information:   [address] [place] [address] [phone]          Medication List     As of 01/14/2012  1:42 AM    TAKE these medications         ibuprofen 100 MG tablet   Commonly known as: ADVIL,MOTRIN   Take 100 mg by mouth every 6 (six) hours as needed. headache      valACYclovir 1000 MG tablet   Commonly known as: VALTREX   Take 1 tablet (1,000 mg total) by mouth 2 (two) times daily. X 7-10 days for first outbreak, then 1000 mg per day x 5 days for recurrence.         [place], CNM 01/13/2012  12:52 AM

## 2012-01-14 LAB — HERPES SIMPLEX VIRUS CULTURE
Culture: DETECTED
Special Requests: NORMAL

## 2012-01-26 ENCOUNTER — Telehealth: Payer: Self-pay | Admitting: Medical

## 2012-01-26 DIAGNOSIS — A6000 Herpesviral infection of urogenital system, unspecified: Secondary | ICD-10-CM

## 2012-01-26 MED ORDER — VALACYCLOVIR HCL 1 G PO TABS: 1000.0000 mg | ORAL_TABLET | Freq: Two times a day (BID) | ORAL | Status: DC

## 2012-01-26 NOTE — Telephone Encounter (Signed)
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 Take Valtrex as directed (has Rx) and use condoms.

## 2012-01-26 NOTE — Telephone Encounter (Signed)
Contacted patient. Informed her that culture was positive and that she should take the valtrex as directed and use condoms during intercourse. Patient voiced understanding, but states that she lost the Rx. I will resend to her pharmacy ([facility] in [place]) for her to pick up today. The patient did not voice any further questions at this time.

## 2012-08-21 ENCOUNTER — Encounter (HOSPITAL_COMMUNITY): Payer: Self-pay | Admitting: *Deleted

## 2012-08-21 ENCOUNTER — Emergency Department (HOSPITAL_COMMUNITY)
Admission: EM | Admit: 2012-08-21 | Discharge: 2012-08-21 | Discharge status: Against Medical Advice | Payer: Self-pay | Attending: Emergency Medicine | Admitting: Emergency Medicine

## 2012-08-21 DIAGNOSIS — J069 Acute upper respiratory infection, unspecified: Secondary | ICD-10-CM | POA: Insufficient documentation

## 2012-08-21 DIAGNOSIS — J988 Other specified respiratory disorders: Secondary | ICD-10-CM

## 2012-08-21 DIAGNOSIS — J029 Acute pharyngitis, unspecified: Secondary | ICD-10-CM

## 2012-08-21 DIAGNOSIS — R509 Fever, unspecified: Secondary | ICD-10-CM | POA: Insufficient documentation

## 2012-08-21 DIAGNOSIS — J3489 Other specified disorders of nose and nasal sinuses: Secondary | ICD-10-CM | POA: Insufficient documentation

## 2012-08-21 DIAGNOSIS — R05 Cough: Secondary | ICD-10-CM | POA: Insufficient documentation

## 2012-08-21 DIAGNOSIS — R52 Pain, unspecified: Secondary | ICD-10-CM | POA: Insufficient documentation

## 2012-08-21 DIAGNOSIS — B9789 Other viral agents as the cause of diseases classified elsewhere: Secondary | ICD-10-CM

## 2012-08-21 DIAGNOSIS — IMO0001 Reserved for inherently not codable concepts without codable children: Secondary | ICD-10-CM | POA: Insufficient documentation

## 2012-08-21 DIAGNOSIS — R51 Headache: Secondary | ICD-10-CM | POA: Insufficient documentation

## 2012-08-21 DIAGNOSIS — M542 Cervicalgia: Secondary | ICD-10-CM | POA: Insufficient documentation

## 2012-08-21 DIAGNOSIS — Z8619 Personal history of other infectious and parasitic diseases: Secondary | ICD-10-CM | POA: Insufficient documentation

## 2012-08-21 DIAGNOSIS — R059 Cough, unspecified: Secondary | ICD-10-CM | POA: Insufficient documentation

## 2012-08-21 DIAGNOSIS — F17200 Nicotine dependence, unspecified, uncomplicated: Secondary | ICD-10-CM | POA: Insufficient documentation

## 2012-08-21 DIAGNOSIS — H53149 Visual discomfort, unspecified: Secondary | ICD-10-CM | POA: Insufficient documentation

## 2012-08-21 MED ORDER — NAPROXEN 500 MG PO TABS: 500.0000 mg | ORAL_TABLET | Freq: Two times a day (BID) | ORAL | Status: DC

## 2012-08-21 NOTE — ED Notes (Signed)
Pt walked out no papers signed

## 2012-08-21 NOTE — ED Provider Notes (Signed)
History  This chart was scribed for Ruth Jakes, MD by Ardelia Mems, ED Scribe. This patient was seen in room APA09/APA09 and the patient's care was started at 11:37 AM.   CSN: [id number]  Arrival date & time 08/21/12  1036     Chief Complaint  Patient presents with  . Sore Throat  . Headache     Patient is a 27 y.o. female presenting with pharyngitis. The history is provided by the patient. No language interpreter was used.  Sore Throat This is a new problem. The current episode started 12 to 24 hours ago. The problem occurs constantly. The problem has been gradually worsening. Associated symptoms include headaches. Pertinent negatives include no chest pain, no abdominal pain and no shortness of breath. Nothing aggravates the symptoms.   HPI Comments: Ruth Mullen is a 27 y.o. female who presents to the Emergency Department complaining of constant sore throat since yesterday. There are associated body aches, itchy throat, photophobia, fever, HA, dry cough, congestion and chills. Pt took Ibuprofen 4 hours ago with some relief. Pt has a h/o strep throat. Pt denies CP, SOB, abdominal pain, nausea and any other symptoms. Pt denies having a PCP currently.  Past Medical History  Diagnosis Date  . No pertinent past medical history     Past Surgical History  Procedure Laterality Date  . Dilation and curettage of uterus    . Cholecystectomy      No family history on file.  History  Substance Use Topics  . Smoking status: Current Every Day Smoker  . Smokeless tobacco: Not on file  . Alcohol Use: No    OB History   Grav Para Term Preterm Abortions TAB SAB Ect Mult Living   3 1   2 1 1   1       Review of Systems  Constitutional: Positive for fever and chills.  HENT: Positive for congestion, sore throat and neck pain. Negative for rhinorrhea.   Eyes: Positive for photophobia. Negative for visual disturbance.  Respiratory: Positive for cough. Negative for shortness  of breath.   Cardiovascular: Negative for chest pain and leg swelling.  Gastrointestinal: Negative for nausea, vomiting, abdominal pain and diarrhea.  Genitourinary: Negative for dysuria.  Musculoskeletal: Positive for myalgias. Negative for back pain.       Positive for body aches.   Skin: Negative for rash.  Neurological: Positive for headaches.  Hematological: Does not bruise/bleed easily.  Psychiatric/Behavioral: Negative for confusion.    Allergies  Review of patient's allergies indicates no known allergies.  Home Medications   Current Outpatient Rx  Name  Route  Sig  Dispense  Refill  . acyclovir (ZOVIRAX) 400 MG tablet   Oral   Take 400 mg by mouth 2 (two) times daily.         Marland Kitchen ibuprofen (ADVIL,MOTRIN) 100 MG tablet   Oral   Take 200 mg by mouth every 6 (six) hours as needed for fever. headache         . naproxen (NAPROSYN) 500 MG tablet   Oral   Take 1 tablet (500 mg total) by mouth 2 (two) times daily.   14 tablet   0     Triage Vitals: BP 127/75  Pulse 92  Temp(Src) 97.7 F (36.5 C) (Oral)  Resp 20  Ht 5' (1.524 m)  Wt 115 lb (52.164 kg)  BMI 22.46 kg/m2  SpO2 97%  LMP 08/02/2012  Physical Exam  Constitutional: She is oriented to person, place,  and time. She appears well-developed and well-nourished.  HENT:  Head: Normocephalic and atraumatic.  Mouth/Throat: Posterior oropharyngeal erythema present. No oropharyngeal exudate.  Eyes: Conjunctivae and EOM are normal. Pupils are equal, round, and reactive to light.  Neck: Normal range of motion. Neck supple. No tracheal deviation present.  Cardiovascular: Normal rate, regular rhythm and normal heart sounds.   No murmur heard. Pulmonary/Chest: Effort normal and breath sounds normal. No respiratory distress.  Abdominal: Soft. Bowel sounds are normal. There is no tenderness.  Musculoskeletal: Normal range of motion. She exhibits no edema.  Neurological: She is alert and oriented to person, place, and  time.  Skin: Skin is warm and dry. No rash noted.  Psychiatric: She has a normal mood and affect. Her behavior is normal.    ED Course  Procedures (including critical care time)  DIAGNOSTIC STUDIES: Oxygen Saturation is 97% on RA, normal by my interpretation.    COORDINATION OF CARE: 12:00 PM- Pt advised of plan for treatment and pt agrees.     Labs Reviewed  RAPID STREP SCREEN   No results found.   1. Pharyngitis   2. Viral respiratory illness       MDM  Patient with onset of illness just yesterday. Symptoms seem very viral strep throat test was negative for strep pharyngitis. Patient has some congestion and sore throat bodyaches headache fevers feeling and a developing cough. Suspect viral upper respiratory infection. Will treat with Naprosyn and patient also given advice about Mucinex DM if the cough gets worse she will return for any newer worse symptoms. Would not suspect a pneumonia since she was well until just yesterday. Work note provided. Room air oxygen saturations are 97%.        I personally performed the services described in this documentation, which was scribed in my presence. The recorded information has been reviewed and is accurate.      Ruth Jakes, MD 08/21/12 [id number]

## 2012-08-21 NOTE — ED Notes (Addendum)
Pt c/o sore throat, headache, nasal congestion, fever that started yesterday, pt also requesting to be evaluated for "heavy vaginal bleeding", pt states that she started her cycle on 08/02/2012 and stopped yesterday, which is unusual for her.

## 2012-08-21 NOTE — ED Notes (Addendum)
Upon entering room to discharge. Patient states that she has been having vaginal bleeding for almost 20 days and would like to be treated for that. MD aware

## 2012-08-23 ENCOUNTER — Encounter (HOSPITAL_COMMUNITY): Payer: Self-pay | Admitting: *Deleted

## 2012-08-23 ENCOUNTER — Emergency Department (HOSPITAL_COMMUNITY): Payer: Medicaid Other

## 2012-08-23 ENCOUNTER — Emergency Department (HOSPITAL_COMMUNITY)
Admission: EM | Admit: 2012-08-23 | Discharge: 2012-08-23 | Disposition: A | Discharge status: Home / Self Care | Payer: Medicaid Other | Attending: Emergency Medicine | Admitting: Emergency Medicine

## 2012-08-23 DIAGNOSIS — J029 Acute pharyngitis, unspecified: Secondary | ICD-10-CM | POA: Insufficient documentation

## 2012-08-23 DIAGNOSIS — H53149 Visual discomfort, unspecified: Secondary | ICD-10-CM | POA: Insufficient documentation

## 2012-08-23 DIAGNOSIS — J3489 Other specified disorders of nose and nasal sinuses: Secondary | ICD-10-CM | POA: Insufficient documentation

## 2012-08-23 DIAGNOSIS — F17200 Nicotine dependence, unspecified, uncomplicated: Secondary | ICD-10-CM | POA: Insufficient documentation

## 2012-08-23 DIAGNOSIS — J069 Acute upper respiratory infection, unspecified: Secondary | ICD-10-CM | POA: Insufficient documentation

## 2012-08-23 DIAGNOSIS — R34 Anuria and oliguria: Secondary | ICD-10-CM | POA: Insufficient documentation

## 2012-08-23 DIAGNOSIS — J209 Acute bronchitis, unspecified: Secondary | ICD-10-CM

## 2012-08-23 DIAGNOSIS — R079 Chest pain, unspecified: Secondary | ICD-10-CM | POA: Insufficient documentation

## 2012-08-23 DIAGNOSIS — R630 Anorexia: Secondary | ICD-10-CM | POA: Insufficient documentation

## 2012-08-23 DIAGNOSIS — Z79899 Other long term (current) drug therapy: Secondary | ICD-10-CM | POA: Insufficient documentation

## 2012-08-23 DIAGNOSIS — R509 Fever, unspecified: Secondary | ICD-10-CM | POA: Insufficient documentation

## 2012-08-23 MED ORDER — AZITHROMYCIN 250 MG PO TABS
500.0000 mg | ORAL_TABLET | Freq: Once | ORAL | Status: AC
  Administered 2012-08-23: 500 mg via ORAL
  Filled 2012-08-23: qty 2

## 2012-08-23 MED ORDER — AZITHROMYCIN 250 MG PO TABS: ORAL_TABLET | ORAL | Status: DC

## 2012-08-23 MED ORDER — GUAIFENESIN-CODEINE 100-10 MG/5ML PO SOLN
10.0000 mL | Freq: Once | ORAL | Status: AC
  Administered 2012-08-23: 10 mL via ORAL
  Filled 2012-08-23 (×2): qty 5

## 2012-08-23 MED ORDER — GUAIFENESIN-CODEINE 100-10 MG/5ML PO SYRP: ORAL_SOLUTION | ORAL | Status: DC

## 2012-08-23 NOTE — ED Provider Notes (Signed)
History  This chart was scribed for Ruth Cooper III, MD by Shari Heritage, ED Scribe. The patient was seen in room APA05/APA05. Patient's care was started at 1444.   CSN: [id number]  Arrival date & time 08/23/12  1411   First MD Initiated Contact with Patient 08/23/12 1444      Chief Complaint  Patient presents with  . Cough  . URI    The history is provided by the patient. No language interpreter was used.    HPI Comments: Ruth Mullen is a 27 y.o. female who presents to the Emergency Department complaining of persistent worsening cough onset 3 days ago.  Cough is productive of yellow sputum. There is associated fever, sore throat, congestion, rhinorrhea, photophobia and chest pain. Patient states that she is unable to sleep, has decreased appetite and decreased urine. Patient denies vomiting, diarrhea, rash, lightheadedness, fever, sore throat, headaches, difficulty urinating, dysuria, back pain or any other symptoms. Patient was seen for the same on 08/21/2012. She was given naprosyn advised to purchase Mucinex DM for cough. Patient says that she has tried both of these medicines without relief. Patient has no chronic medical conditions. Last day of LMP was 08/21/2012 and patient says that it lasted longer than usual. She has a surgical history of cholecystectomy and D&C. Patient usually smokes 4-5 cigarettes per day.   Past Medical History  Diagnosis Date  . No pertinent past medical history     Past Surgical History  Procedure Laterality Date  . Dilation and curettage of uterus    . Cholecystectomy      No family history on file.  History  Substance Use Topics  . Smoking status: Current Every Day Smoker  . Smokeless tobacco: Not on file  . Alcohol Use: No    OB History   Grav Para Term Preterm Abortions TAB SAB Ect Mult Living   3 1   2 1 1   1       Review of Systems  Constitutional: Positive for chills and appetite change (decreased). Negative for fever.  HENT:  Positive for congestion, sore throat and rhinorrhea.   Eyes: Positive for photophobia.  Respiratory: Positive for cough.   Cardiovascular: Positive for chest pain.  Gastrointestinal: Negative for nausea, vomiting and abdominal pain.  Genitourinary: Positive for decreased urine volume.  Skin: Negative for rash.  All other systems reviewed and are negative.    Allergies  Review of patient's allergies indicates no known allergies.  Home Medications   Current Outpatient Rx  Name  Route  Sig  Dispense  Refill  . acyclovir (ZOVIRAX) 400 MG tablet   Oral   Take 400 mg by mouth 2 (two) times daily.         Marland Kitchen ibuprofen (ADVIL,MOTRIN) 100 MG tablet   Oral   Take 200 mg by mouth every 6 (six) hours as needed for fever. headache         . naproxen (NAPROSYN) 500 MG tablet   Oral   Take 1 tablet (500 mg total) by mouth 2 (two) times daily.   14 tablet   0     Triage Vitals: BP 131/74  Pulse 98  Temp(Src) 98 F (36.7 C) (Oral)  Resp 20  SpO2 95%  LMP 08/02/2012  Physical Exam  Constitutional: She is oriented to person, place, and time. She appears well-developed and well-nourished.  Ill appearing with hacking cough.  HENT:  Head: Normocephalic and atraumatic.  Mouth/Throat: Posterior oropharyngeal erythema present.  Eyes:  Conjunctivae and EOM are normal. Pupils are equal, round, and reactive to light.  Neck: Normal range of motion. Neck supple.  Cardiovascular: Normal rate, regular rhythm and normal heart sounds.   No murmur heard. Pulmonary/Chest: Effort normal and breath sounds normal. No respiratory distress. She has no wheezes. She has no rales.  Abdominal: Soft. There is no tenderness.  Musculoskeletal: Normal range of motion. She exhibits no edema and no tenderness.  Lymphadenopathy:    She has no cervical adenopathy.  Neurological: She is alert and oriented to person, place, and time.  Neurologically intact.   Skin: Skin is warm and dry.    ED Course   Procedures (including critical care time) DIAGNOSTIC STUDIES: Oxygen Saturation is 95% on room air, adequate by my interpretation.    COORDINATION OF CARE: 2:55 PM- Patient with persistent cough and congestion. Suspect acute bronchitis. Will prescribe antibiotics to treat. Patient informed of current plan for treatment and evaluation and agrees with plan at this time.  Rx azithromycin, Robitussin AC, no work for 3 days.     Dg Chest 2 View  08/23/2012  *RADIOLOGY REPORT*  Clinical Data: Cough, congestion, smoker.  CHEST - 2 VIEW  Comparison: 07/15/2006  Findings: Heart and mediastinal contours are within normal limits. No focal opacities or effusions.  No acute bony abnormality.  IMPRESSION: No active cardiopulmonary disease.   Original Report Authenticated By: Charlett Nose, M.D.      1. Acute bronchitis      I personally performed the services described in this documentation, which was scribed in my presence. The recorded information has been reviewed and is accurate.  Osvaldo Human, M.D.     Ruth Cooper III, MD 08/24/12 1240

## 2012-08-23 NOTE — ED Notes (Signed)
Pt c/o cough that is productive yellow mucous streaked with blood, unable to sleep. Not wanting to eat, states that she has tried the naprosyn and Muccinex DM with no relief. Was seen in er over the weekend for same,

## 2012-10-20 ENCOUNTER — Other Ambulatory Visit: Payer: Self-pay | Admitting: Obstetrics & Gynecology

## 2012-10-20 DIAGNOSIS — O3680X0 Pregnancy with inconclusive fetal viability, not applicable or unspecified: Secondary | ICD-10-CM

## 2012-10-21 ENCOUNTER — Inpatient Hospital Stay (HOSPITAL_COMMUNITY)
Admission: AD | Admit: 2012-10-21 | Discharge: 2012-10-21 | Disposition: A | Discharge status: Home / Self Care | Payer: Medicaid Other | Source: Ambulatory Visit | Attending: Obstetrics & Gynecology | Admitting: Obstetrics & Gynecology

## 2012-10-21 ENCOUNTER — Encounter (HOSPITAL_COMMUNITY): Payer: Self-pay

## 2012-10-21 DIAGNOSIS — M545 Low back pain, unspecified: Secondary | ICD-10-CM | POA: Insufficient documentation

## 2012-10-21 DIAGNOSIS — O26899 Other specified pregnancy related conditions, unspecified trimester: Secondary | ICD-10-CM

## 2012-10-21 DIAGNOSIS — R109 Unspecified abdominal pain: Secondary | ICD-10-CM

## 2012-10-21 DIAGNOSIS — O200 Threatened abortion: Secondary | ICD-10-CM | POA: Insufficient documentation

## 2012-10-21 DIAGNOSIS — O99891 Other specified diseases and conditions complicating pregnancy: Secondary | ICD-10-CM | POA: Insufficient documentation

## 2012-10-21 HISTORY — DX: Chlamydial infection, unspecified: A74.9

## 2012-10-21 HISTORY — DX: Herpesviral infection, unspecified: B00.9

## 2012-10-21 LAB — WET PREP, GENITAL: Yeast Wet Prep HPF POC: NONE SEEN

## 2012-10-21 LAB — URINALYSIS, ROUTINE W REFLEX MICROSCOPIC
Glucose, UA: NEGATIVE mg/dL
Hgb urine dipstick: NEGATIVE
Ketones, ur: 40 mg/dL — AB
Protein, ur: NEGATIVE mg/dL

## 2012-10-21 MED ORDER — PROMETHAZINE HCL 25 MG PO TABS: 50.0000 mg | ORAL_TABLET | Freq: Four times a day (QID) | ORAL | Status: DC | PRN

## 2012-10-21 NOTE — Progress Notes (Signed)
Dr Thad Ranger in and d/c plan discussed. WRitten and verbal d/c instructions given and understanding voiced

## 2012-10-21 NOTE — MAU Note (Signed)
Patient is in with c/o nausea (vomiting twice), lower back and abdominal pain. Denies dysuria, vaginal bleeding or abnormal discharge.

## 2012-10-21 NOTE — MAU Provider Note (Signed)
History     CSN: [id number]  Arrival date and time: 10/21/12 2019   First Provider Initiated Contact with Patient 10/21/12 2105      Chief Complaint  Patient presents with  . Nausea  . Back Pain  . Abdominal Cramping  . Dizziness   HPI 27 y.o. [id number] at [redacted]w[redacted]d by LMP with cramping, lower back pain and some nausea x 1 day. Had prior miscarriage (2012) and worried about miscarriage. Had positive home pregnancy test plans to receive care at [facility].  No bleeding. Does have some urinary frequency and thirst and white vaginal discharge.  History of one vaginal delivery 8 years ago, TAB, SAB and current pregnancy. No diarrhea or constipation. No fever or chills, but has had some hot flashes. Pt is on acyclovir for HSV-2 suppression. Denies current lesions.  OB History   Grav Para Term Preterm Abortions TAB SAB Ect Mult Living   4 1   2 1 1   1       Past Medical History  Diagnosis Date  . No pertinent past medical history   . 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 Chlamydia     Past Surgical History  Procedure Laterality Date  . Dilation and curettage of uterus    . Cholecystectomy      History reviewed. No pertinent family history.  History  Substance Use Topics  . Smoking status: Former Games developer  . Smokeless tobacco: Not on file  . Alcohol Use: No    Allergies: No Known Allergies  Prescriptions prior to admission  Medication Sig Dispense Refill  . acyclovir (ZOVIRAX) 400 MG tablet Take 400 mg by mouth 2 (two) times daily.      . Prenatal Vit-Fe Fumarate-FA (PRENATAL MULTIVITAMIN) TABS Take 1 tablet by mouth at bedtime.        ROS  See HPI  Physical Exam   Blood pressure 123/64, pulse 76, temperature 98.2 F (36.8 C), temperature source Oral, resp. rate 16, height 5\' 1"  (1.549 m), weight 49.215 kg (108 lb 8 oz), last menstrual period 09/07/2012, SpO2 100.00%.  Physical Exam GEN:  WNWD, no distress HEENT:  NCAT, EOMI, conjunctiva clear NECK:   Supple, non-tender, no thyromegaly, trachea midline CV: RRR, no murmur RESP:  CTAB ABD:  Soft, non-tender, no guarding or rebound, normal bowel sounds EXTREM:  Warm, well perfused, no edema or tenderness NEURO:  Alert, oriented, no focal deficits GU:  Normal external genitalia, normal vagina, moderate creamy white discharge. Normal cervix, closed, no CMT or adnexal tenderness.   Bedside sono shows gestational sac, yoke sac, and probable fetal pole, though unable to make out fetal pole well due to poor quality of image  MAU Course  Procedures  Results for orders placed during the hospital encounter of 10/21/12 (from the past 24 hour(s))  URINALYSIS, ROUTINE W REFLEX MICROSCOPIC     Status: Abnormal   Collection Time    10/21/12  8:28 PM      Result Value Range   Color, Urine YELLOW  YELLOW   APPearance CLEAR  CLEAR   Specific Gravity, Urine 1.020  1.005 - 1.030   pH 6.0  5.0 - 8.0   Glucose, UA NEGATIVE  NEGATIVE mg/dL   Hgb urine dipstick NEGATIVE  NEGATIVE   Bilirubin Urine NEGATIVE  NEGATIVE   Ketones, ur 40 (*) NEGATIVE mg/dL   Protein, ur NEGATIVE  NEGATIVE mg/dL   Urobilinogen, UA 0.2  0.0 - 1.0 mg/dL   Nitrite  NEGATIVE  NEGATIVE   Leukocytes, UA NEGATIVE  NEGATIVE  WET PREP, GENITAL     Status: Abnormal   Collection Time    10/21/12  8:55 PM      Result Value Range   Yeast Wet Prep HPF POC NONE SEEN  NONE SEEN   Trich, Wet Prep NONE SEEN  NONE SEEN   Clue Cells Wet Prep HPF POC FEW (*) NONE SEEN   WBC, Wet Prep HPF POC FEW (*) NONE SEEN    Assessment and Plan  27 y.o. [id number] at [redacted]w[redacted]d with cramping, back pain and nausea - Threatened abortion vs normal early pregnancy cramping - no bleeding and IUP visible on bedside ultrasound (GS and YS) - Tylenol for discomfort - BV - few clue cells - no real discharge or odor - will defer treatment - Stable for discharge home - Establish care with [facility] as planned  Napoleon Form 10/21/2012, 9:14 PM

## 2012-10-22 LAB — GC/CHLAMYDIA PROBE AMP
CT Probe RNA: NEGATIVE
GC Probe RNA: NEGATIVE

## 2012-10-24 ENCOUNTER — Ambulatory Visit (INDEPENDENT_AMBULATORY_CARE_PROVIDER_SITE_OTHER): Payer: Medicaid Other

## 2012-10-24 ENCOUNTER — Encounter (HOSPITAL_COMMUNITY): Payer: Self-pay | Admitting: *Deleted

## 2012-10-24 ENCOUNTER — Encounter: Payer: Self-pay | Admitting: *Deleted

## 2012-10-24 ENCOUNTER — Inpatient Hospital Stay (HOSPITAL_COMMUNITY): Payer: Medicaid Other

## 2012-10-24 ENCOUNTER — Inpatient Hospital Stay (HOSPITAL_COMMUNITY)
Admission: AD | Admit: 2012-10-24 | Discharge: 2012-10-24 | Disposition: A | Discharge status: Home / Self Care | Payer: Medicaid Other | Source: Ambulatory Visit | Attending: Obstetrics and Gynecology | Admitting: Obstetrics and Gynecology

## 2012-10-24 DIAGNOSIS — O468X9 Other antepartum hemorrhage, unspecified trimester: Secondary | ICD-10-CM

## 2012-10-24 DIAGNOSIS — O418X10 Other specified disorders of amniotic fluid and membranes, first trimester, not applicable or unspecified: Secondary | ICD-10-CM

## 2012-10-24 DIAGNOSIS — O209 Hemorrhage in early pregnancy, unspecified: Secondary | ICD-10-CM | POA: Insufficient documentation

## 2012-10-24 DIAGNOSIS — R109 Unspecified abdominal pain: Secondary | ICD-10-CM | POA: Insufficient documentation

## 2012-10-24 DIAGNOSIS — O3680X0 Pregnancy with inconclusive fetal viability, not applicable or unspecified: Secondary | ICD-10-CM

## 2012-10-24 LAB — URINALYSIS, ROUTINE W REFLEX MICROSCOPIC
Bilirubin Urine: NEGATIVE
Glucose, UA: NEGATIVE mg/dL
Ketones, ur: NEGATIVE mg/dL
Leukocytes, UA: NEGATIVE
Protein, ur: NEGATIVE mg/dL

## 2012-10-24 LAB — CBC
HCT: 35 % — ABNORMAL LOW (ref 36.0–46.0)
Hemoglobin: 12.2 g/dL (ref 12.0–15.0)
RBC: 4.68 MIL/uL (ref 3.87–5.11)
WBC: 6.9 10*3/uL (ref 4.0–10.5)

## 2012-10-24 LAB — ABO/RH: ABO/RH(D): B NEG

## 2012-10-24 LAB — OB RESULTS CONSOLE ABO/RH: RH Type: NEGATIVE

## 2012-10-24 LAB — URINE MICROSCOPIC-ADD ON

## 2012-10-24 MED ORDER — ACETAMINOPHEN 500 MG PO TABS
1000.0000 mg | ORAL_TABLET | Freq: Once | ORAL | Status: AC
  Administered 2012-10-24: 1000 mg via ORAL
  Filled 2012-10-24: qty 2

## 2012-10-24 MED ORDER — RHO D IMMUNE GLOBULIN 1500 UNIT/2ML IJ SOLN
300.0000 ug | Freq: Once | INTRAMUSCULAR | Status: AC
  Administered 2012-10-24: 300 ug via INTRAMUSCULAR
  Filled 2012-10-24: qty 2

## 2012-10-24 NOTE — MAU Provider Note (Signed)
History     CSN: [id number]  Arrival date and time: 10/24/12 3086   First Provider Initiated Contact with Patient 10/24/12 2049      Chief Complaint  Patient presents with  . Vaginal Bleeding  . Abdominal Pain   HPI [name] is a 27 y.o. [id number] at [redacted]w[redacted]d how presents to MAU today with complaint of vaginal bleeding and abdominal pain. The patient states that she noted spotting with wiping around 1900 today. She has noted more bleeding since arrival in MAU. She states mild lower abdominal cramping. She denies fever, N/V, discharge, recent intercourse or exam. The patient was seen at [facility] today and had [place] to confirm dating. FCA noted. No mention of possible source of bleeding.    OB History   Grav Para Term Preterm Abortions TAB SAB Ect Mult Living   4 1   2 1 1   2       Past Medical History  Diagnosis Date  . No pertinent past medical history   . 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 Chlamydia     Past Surgical History  Procedure Laterality Date  . Dilation and curettage of uterus    . Cholecystectomy      No family history on file.  History  Substance Use Topics  . Smoking status: Former Games developer  . Smokeless tobacco: Not on file  . Alcohol Use: No    Allergies: No Known Allergies  Prescriptions prior to admission  Medication Sig Dispense Refill  . acyclovir (ZOVIRAX) 400 MG tablet Take 400 mg by mouth 2 (two) times daily.      . Prenatal Vit-Fe Fumarate-FA (PRENATAL MULTIVITAMIN) TABS Take 1 tablet by mouth at bedtime.      . promethazine (PHENERGAN) 25 MG tablet Take 2 tablets (50 mg total) by mouth every 6 (six) hours as needed for nausea.  30 tablet  0    Review of Systems  Constitutional: Negative for fever and malaise/fatigue.  Gastrointestinal: Positive for abdominal pain. Negative for nausea, vomiting, diarrhea and constipation.  Genitourinary: Negative for dysuria, urgency and frequency.       + vaginal bleeding Neg - vaginal discharge    Physical Exam   Blood pressure 113/65, pulse 113, temperature 98.6 F (37 C), temperature source Oral, resp. rate 20, height 5\' 1"  (1.549 m), weight 108 lb 4 oz (49.102 kg), last menstrual period 09/07/2012.  Physical Exam  Constitutional: She is oriented to person, place, and time. She appears well-developed and well-nourished. No distress.  HENT:  Head: Normocephalic and atraumatic.  Cardiovascular: Normal rate, regular rhythm and normal heart sounds.   Respiratory: Effort normal and breath sounds normal. No respiratory distress.  GI: Soft. Bowel sounds are normal. She exhibits no distension and no mass. There is no tenderness. There is no rebound and no guarding.  Genitourinary: Uterus is tender (mild tenderness to palpation). Uterus is not enlarged. Cervix exhibits discharge (blood tinged mucus noted at the cervical os). Cervix exhibits no motion tenderness and no friability. Right adnexum displays no mass and no tenderness. Left adnexum displays no mass and no tenderness. There is bleeding (small amount of bleeding noted) around the vagina. No vaginal discharge found.  Neurological: She is alert and oriented to person, place, and time.  Skin: Skin is warm and dry. No erythema.  Psychiatric: She has a normal mood and affect.   Results for orders placed during the hospital encounter of 10/24/12 (from the past 24 hour(s))  URINALYSIS, ROUTINE W REFLEX MICROSCOPIC     Status: Abnormal   Collection Time    10/24/12  8:05 PM      Result Value Range   Color, Urine YELLOW  YELLOW   APPearance CLEAR  CLEAR   Specific Gravity, Urine <1.005 (*) 1.005 - 1.030   pH 6.5  5.0 - 8.0   Glucose, UA NEGATIVE  NEGATIVE mg/dL   Hgb urine dipstick LARGE (*) NEGATIVE   Bilirubin Urine NEGATIVE  NEGATIVE   Ketones, ur NEGATIVE  NEGATIVE mg/dL   Protein, ur NEGATIVE  NEGATIVE mg/dL   Urobilinogen, UA 0.2  0.0 - 1.0 mg/dL   Nitrite NEGATIVE  NEGATIVE   Leukocytes, UA NEGATIVE  NEGATIVE  URINE  MICROSCOPIC-ADD ON     Status: Abnormal   Collection Time    10/24/12  8:05 PM      Result Value Range   Squamous Epithelial / LPF FEW (*) RARE   WBC, UA 0-2  <3 WBC/hpf   RBC / HPF 0-2  <3 RBC/hpf   Bacteria, UA RARE  RARE  CBC     Status: Abnormal   Collection Time    10/24/12  9:45 PM      Result Value Range   WBC 6.9  4.0 - 10.5 K/uL   RBC 4.68  3.87 - 5.11 MIL/uL   Hemoglobin 12.2  12.0 - 15.0 g/dL   HCT 16.1 (*) 09.6 - 04.5 %   MCV 74.8 (*) 78.0 - 100.0 fL   MCH 26.1  26.0 - 34.0 pg   MCHC 34.9  30.0 - 36.0 g/dL   RDW 40.9  81.1 - 91.4 %   Platelets 283  150 - 400 K/uL  ABO/RH     Status: None   Collection Time    10/24/12  9:45 PM      Result Value Range   ABO/RH(D) B NEG    RH IG WORKUP (INCLUDES ABO/RH)     Status: None   Collection Time    10/24/12  9:45 PM      Result Value Range   Gestational Age(Wks) 6     ABO/RH(D) B NEG     Antibody Screen NEG     Unit Number [id number]     Blood Component Type RHIG     Unit division 00     Status of Unit ISSUED     Transfusion Status OK TO TRANSFUSE     US Ob Comp Less 14 Wks  10/24/2012   *RADIOLOGY REPORT*  Clinical Data: Vaginal bleeding.  OBSTETRIC <14 WK [place] AND TRANSVAGINAL OB US  Technique:  Both transabdominal and transvaginal ultrasound examinations were performed for complete evaluation of the gestation as well as the maternal uterus, adnexal regions, and pelvic cul-de-sac.  Transvaginal technique was performed to assess early pregnancy.  Comparison:  None.  Intrauterine gestational sac:  Visualized/normal in shape. Yolk sac: Present Embryo: Present Cardiac Activity: Present Heart Rate: 124 bpm  CRL: 7.1  mm  6 w  5 d        [place] EDC: 06/14/2013  Maternal uterus/adnexae: Small subchorionic hemorrhage.  Physiologic appearance of the ovaries.  IMPRESSION: Single intrauterine pregnancy with small subchorionic hemorrhage.   Original Report Authenticated By: Andreas Newport, M.D.   US Ob Comp Less 14 Wks  10/24/2012    DATING AND VIABILITY SONOGRAM   Ruth Mullen is a 27 y.o. year old [id number] with LMP 5/14/2014which  would correlate to  [redacted]w[redacted]d weeks gestation.  She has regular menstrual  cycles.   She is here today for a confirmatory initial sonogram.    GESTATION: SINGLETON     FETAL ACTIVITY:          Heart rate         124 BPM   CERVIX: Long and closed  ADNEXA: The ovaries are normal.C.L. Noted on RT 25 mm   GESTATIONAL AGE AND  BIOMETRICS:  Gestational criteria: Estimated Date of Delivery: 06/14/13 by LMP now at  [redacted]w[redacted]d  Previous Scans:0  GESTATIONAL SAC            mm          weeks  CROWN RUMP LENGTH           5.5 mm         6+2 weeks                                                                               AVERAGE EGA(BY THIS SCAN):   6+2 weeks  WORKING EDD( LMP, early ultrasound ):  06/14/2013     TECHNICIAN COMMENTS:  Single IUP with +FCA noted, CRL c/w LMP dates, cx long and closed        A copy of this report including all images has been saved and backed up to  a second source for retrieval if needed. All measures and details of the  anatomical scan, placentation, fluid volume and pelvic anatomy are  contained in that report.  Chari Manning 10/24/2012 1:54 PM    US Ob Transvaginal  10/24/2012   *RADIOLOGY REPORT*  Clinical Data: Vaginal bleeding.  OBSTETRIC <14 WK [place] AND TRANSVAGINAL OB US  Technique:  Both transabdominal and transvaginal ultrasound examinations were performed for complete evaluation of the gestation as well as the maternal uterus, adnexal regions, and pelvic cul-de-sac.  Transvaginal technique was performed to assess early pregnancy.  Comparison:  None.  Intrauterine gestational sac:  Visualized/normal in shape. Yolk sac: Present Embryo: Present Cardiac Activity: Present Heart Rate: 124 bpm  CRL: 7.1  mm  6 w  5 d        [place] EDC: 06/14/2013  Maternal uterus/adnexae: Small subchorionic hemorrhage.  Physiologic appearance of the ovaries.  IMPRESSION: Single intrauterine pregnancy with small  subchorionic hemorrhage.   Original Report Authenticated By: Andreas Newport, M.D.    MAU Course  Procedures None  MDM Tylenol 1000 mg for pain CBC, ABO/Rh and [place] today Rhogam work-up and Rhophylac given in MAU  Assessment and Plan  A: IUP at 6w 5d with cardiac activity Small subchorionic hemorrhage Vaginal bleeding in early pregnancy  P: Discharge home Pelvic rest and bleeding precautions discussed Rhogam given Recommended Tylenol PRN pain Patient encouraged to keep follow-up with [facility] as scheduled Patient may return to MAU as needed or if her condition were to change or worsen  Freddi Starr, PA-C  10/24/2012, 11:39 PM

## 2012-10-24 NOTE — MAU Note (Signed)
PT SAYS   HAD A DR'S APPOINTMENT  TODAY- ALL FINE   HAD U/S AND HEARD [facility]-.  THEN AT 7PM  TONIGHT  SHE WENT TO B-ROOM-  WHEN SHE WIPED  SHE SAW  RED BLOOD.  IN TRIAGE-   HAS ON PANTY LINER- NOTHING. HAS BEEN HAVING CRAMPS SINCE LAST Friday-  CAME TO MAU.    TODAY  ON WAY TO HOSPITAL- BACK STARTED HURTING.Marland Kitchen  LAST SEX   6-22.

## 2012-10-24 NOTE — Progress Notes (Signed)
U/S-(6+5wks)-single IUP with +FCA noted, CRL c/w LMP dates, cx long and closed, bilateral adnexa wnl with C.L. Noted on RT (25mm), no free fluid noted

## 2012-10-24 NOTE — MAU Note (Signed)
Pt given rhophylac. Packet given to pt. Pt verbalized understanding of reason for receiving.

## 2012-10-25 LAB — RH IG WORKUP (INCLUDES ABO/RH)
ABO/RH(D): B NEG
Antibody Screen: NEGATIVE
Gestational Age(Wks): 6

## 2012-10-25 NOTE — MAU Provider Note (Signed)
Attestation of Attending Supervision of Advanced Practitioner (CNM/NP): Evaluation and management procedures were performed by the Advanced Practitioner under my supervision and collaboration.  I have reviewed the Advanced Practitioner's note and chart, and I agree with the management and plan.  Blakelynn Scheeler 10/25/2012 7:57 AM

## 2012-10-26 ENCOUNTER — Telehealth: Payer: Self-pay | Admitting: Obstetrics and Gynecology

## 2012-10-26 NOTE — Telephone Encounter (Signed)
Spoke with pt. Has New OB scheduled for Monday. Having heavy bleeding, started this past Monday so she went to [facility]. Was told she had a blood clot in uterus. Continues to have bright red bleeding, and some cramping, not severe. Spoke with Dr. Despina Hidden. He said she will probably continue to have some bleeding, but there is nothing we can do to stop it. Advised to keep her appt for Monday. Advised no intercourse. Pt voiced understanding. JSY

## 2012-11-01 ENCOUNTER — Ambulatory Visit (INDEPENDENT_AMBULATORY_CARE_PROVIDER_SITE_OTHER): Payer: Self-pay | Admitting: Advanced Practice Midwife

## 2012-11-01 ENCOUNTER — Encounter: Payer: Self-pay | Admitting: Advanced Practice Midwife

## 2012-11-01 VITALS — BP 130/84 | Wt 109.0 lb

## 2012-11-01 DIAGNOSIS — O360990 Maternal care for other rhesus isoimmunization, unspecified trimester, not applicable or unspecified: Secondary | ICD-10-CM

## 2012-11-01 DIAGNOSIS — O09299 Supervision of pregnancy with other poor reproductive or obstetric history, unspecified trimester: Secondary | ICD-10-CM

## 2012-11-01 DIAGNOSIS — Z1389 Encounter for screening for other disorder: Secondary | ICD-10-CM

## 2012-11-01 DIAGNOSIS — Z3490 Encounter for supervision of normal pregnancy, unspecified, unspecified trimester: Secondary | ICD-10-CM | POA: Insufficient documentation

## 2012-11-01 DIAGNOSIS — Z3480 Encounter for supervision of other normal pregnancy, unspecified trimester: Secondary | ICD-10-CM

## 2012-11-01 DIAGNOSIS — Z331 Pregnant state, incidental: Secondary | ICD-10-CM

## 2012-11-01 LAB — POCT URINALYSIS DIPSTICK
Blood, UA: NEGATIVE
Glucose, UA: NEGATIVE
Ketones, UA: NEGATIVE
Nitrite, UA: NEGATIVE
Protein, UA: NEGATIVE

## 2012-11-01 NOTE — Progress Notes (Signed)
Pt states had vaginal bleeding last week, went to [facility] and told had blood clot in uterus. No more bleeding.  U/S shows + FCA

## 2012-11-02 LAB — DRUG SCREEN, URINE, NO CONFIRMATION
Creatinine,U: 196.8 mg/dL
Marijuana Metabolite: POSITIVE — AB
Methadone: NEGATIVE
Opiate Screen, Urine: NEGATIVE
Phencyclidine (PCP): NEGATIVE
Propoxyphene: NEGATIVE

## 2012-11-02 LAB — RUBELLA SCREEN: Rubella: 3.5 Index — ABNORMAL HIGH (ref <0.90)

## 2012-11-02 LAB — ANTIBODY SCREEN: Antibody Screen: POSITIVE — AB

## 2012-11-02 LAB — RPR

## 2012-11-02 LAB — VARICELLA ZOSTER ANTIBODY, IGG: Varicella IgG: 1966 Index — ABNORMAL HIGH (ref <135.00)

## 2012-11-02 LAB — ANTIBODY TITER (PRENATAL TITER): Ab Titer: <2

## 2012-11-02 LAB — SICKLE CELL SCREEN: Sickle Cell Screen: NEGATIVE

## 2012-11-03 NOTE — Progress Notes (Signed)
  Subjective:    Ruth Mullen is a [id number] [redacted]w[redacted]d being seen today for her first obstetrical visit.  Her obstetrical history is significant for bleeding first trimester with this pregnancy. [facility] identified.  No bleeding now.  Pregnancy history fully reviewed.  Patient reports no complaints.  Filed Vitals:   11/01/12 1044  BP: 130/84  Weight: 109 lb (49.442 kg)    HISTORY: OB History   Grav Para Term Preterm Abortions TAB SAB Ect Mult Living   5 2 2  2 1 1   1      # Outc Date GA Lbr Len/2nd Wgt Sex Del Anes PTL Lv   1 TRM 7/06 [redacted]w[redacted]d 12:00 6lb5oz(2.863kg) M SVD EPI No Yes   2 TAB            3 SAB            Comments: D&C   4 TRM            5 CUR              Past Medical History  Diagnosis Date  . No pertinent past medical history   . 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 Chlamydia    Past Surgical History  Procedure Laterality Date  . Dilation and curettage of uterus    . Cholecystectomy     History reviewed. No pertinent family history.   Exam                                      System:     Skin: normal coloration and turgor, no rashes    Neurologic: oriented, normal, normal mood   Extremities: normal strength, tone, and muscle mass   HEENT PERRLA   Mouth/Teeth mucous membranes moist, pharynx normal without lesions   Neck supple and no masses   Cardiovascular: regular rate and rhythm   Respiratory:  appears well, vitals normal, no respiratory distress, acyanotic, normal RR   Abdomen: soft, non-tender; bowel sounds normal; no masses,  no organomegaly          Assessment:    Pregnancy: [id number] Patient Active Problem List   Diagnosis Date Noted  . Pregnant 11/01/2012        Plan:     Initial labs drawn. Prenatal vitamins. Problem list reviewed and updated. Genetic Screening discussed Integrated Screen: requested.  Ultrasound discussed; fetal survey: requested.  Follow up in 4 weeks for  NT/IT/LROB  CRESENZO-DISHMAN,Girard Koontz 11/03/2012

## 2012-11-14 ENCOUNTER — Telehealth: Payer: Self-pay | Admitting: Advanced Practice Midwife

## 2012-11-14 MED ORDER — PROMETHAZINE HCL 25 MG PO TABS: 25.0000 mg | ORAL_TABLET | Freq: Four times a day (QID) | ORAL | Status: DC | PRN

## 2012-11-14 NOTE — Telephone Encounter (Signed)
Pt requesting refill on phenergan 25 mg for nausea.

## 2012-11-15 NOTE — Telephone Encounter (Signed)
Pt informed phenergan e-scribed 

## 2012-11-24 ENCOUNTER — Telehealth: Payer: Self-pay | Admitting: Advanced Practice Midwife

## 2012-11-24 NOTE — Telephone Encounter (Signed)
C/o mid back pain. Tried tylenol, heating pad and no improvement. No vaginal bleeding.Pt states does not have gallbladder  Pt encouraged to push fluids, rest and continue tylenol. Pt to call office back if no improvement per Cyril Mourning, NP.

## 2012-12-02 ENCOUNTER — Ambulatory Visit (INDEPENDENT_AMBULATORY_CARE_PROVIDER_SITE_OTHER): Payer: Medicaid Other | Admitting: Obstetrics & Gynecology

## 2012-12-02 ENCOUNTER — Other Ambulatory Visit: Payer: Self-pay | Admitting: Obstetrics & Gynecology

## 2012-12-02 ENCOUNTER — Ambulatory Visit (INDEPENDENT_AMBULATORY_CARE_PROVIDER_SITE_OTHER): Payer: Medicaid Other

## 2012-12-02 VITALS — BP 120/80 | Wt 121.0 lb

## 2012-12-02 DIAGNOSIS — O360990 Maternal care for other rhesus isoimmunization, unspecified trimester, not applicable or unspecified: Secondary | ICD-10-CM

## 2012-12-02 DIAGNOSIS — Z331 Pregnant state, incidental: Secondary | ICD-10-CM

## 2012-12-02 DIAGNOSIS — Z36 Encounter for antenatal screening of mother: Secondary | ICD-10-CM

## 2012-12-02 DIAGNOSIS — Z1389 Encounter for screening for other disorder: Secondary | ICD-10-CM

## 2012-12-02 DIAGNOSIS — O09299 Supervision of pregnancy with other poor reproductive or obstetric history, unspecified trimester: Secondary | ICD-10-CM

## 2012-12-02 DIAGNOSIS — Z3481 Encounter for supervision of other normal pregnancy, first trimester: Secondary | ICD-10-CM

## 2012-12-02 LAB — POCT URINALYSIS DIPSTICK
Glucose, UA: NEGATIVE
Ketones, UA: NEGATIVE
Leukocytes, UA: NEGATIVE
Nitrite, UA: NEGATIVE

## 2012-12-02 NOTE — Progress Notes (Signed)
U/S(12+2wks)-single IUP with +FCA noted, CRL c/w dates, cx long and closed, bilateral adnexa WNL, NB present, NT=1.76mm

## 2012-12-02 NOTE — Patient Instructions (Signed)
Pregnancy - Second Trimester The second trimester of pregnancy (3 to 6 months) is a period of rapid growth for you and your baby. At the end of the sixth month, your baby is about 9 inches long and weighs 1 1/2 pounds. You will begin to feel the baby move between 18 and 20 weeks of the pregnancy. This is called quickening. Weight gain is faster. A clear fluid (colostrum) may leak out of your breasts. You may feel small contractions of the womb (uterus). This is known as false labor or Braxton-Hicks contractions. This is like a practice for labor when the baby is ready to be born. Usually, the problems with morning sickness have usually passed by the end of your first trimester. Some women develop small dark blotches (called cholasma, mask of pregnancy) on their face that usually goes away after the baby is born. Exposure to the sun makes the blotches worse. Acne may also develop in some pregnant women and pregnant women who have acne, may find that it goes away. PRENATAL EXAMS  Blood work may continue to be done during prenatal exams. These tests are done to check on your health and the probable health of your baby. Blood work is used to follow your blood levels (hemoglobin). Anemia (low hemoglobin) is common during pregnancy. Iron and vitamins are given to help prevent this. You will also be checked for diabetes between 24 and 28 weeks of the pregnancy. Some of the previous blood tests may be repeated.  The size of the uterus is measured during each visit. This is to make sure that the baby is continuing to grow properly according to the dates of the pregnancy.  Your blood pressure is checked every prenatal visit. This is to make sure you are not getting toxemia.  Your urine is checked to make sure you do not have an infection, diabetes or protein in the urine.  Your weight is checked often to make sure gains are happening at the suggested rate. This is to ensure that both you and your baby are  growing normally.  Sometimes, an ultrasound is performed to confirm the proper growth and development of the baby. This is a test which bounces harmless sound waves off the baby so your caregiver can more accurately determine due dates. Sometimes, a test is done on the amniotic fluid surrounding the baby. This test is called an amniocentesis. The amniotic fluid is obtained by sticking a needle into the belly (abdomen). This is done to check the chromosomes in instances where there is a concern about possible genetic problems with the baby. It is also sometimes done near the end of pregnancy if an early delivery is required. In this case, it is done to help make sure the baby's lungs are mature enough for the baby to live outside of the womb. CHANGES OCCURING IN THE SECOND TRIMESTER OF PREGNANCY Your body goes through many changes during pregnancy. They vary from person to person. Talk to your caregiver about changes you notice that you are concerned about.  During the second trimester, you will likely have an increase in your appetite. It is normal to have cravings for certain foods. This varies from person to person and pregnancy to pregnancy.  Your lower abdomen will begin to bulge.  You may have to urinate more often because the uterus and baby are pressing on your bladder. It is also common to get more bladder infections during pregnancy. You can help this by drinking lots of fluids   and emptying your bladder before and after intercourse.  You may begin to get stretch marks on your hips, abdomen, and breasts. These are normal changes in the body during pregnancy. There are no exercises or medicines to take that prevent this change.  You may begin to develop swollen and bulging veins (varicose veins) in your legs. Wearing support hose, elevating your feet for 15 minutes, 3 to 4 times a day and limiting salt in your diet helps lessen the problem.  Heartburn may develop as the uterus grows and  pushes up against the stomach. Antacids recommended by your caregiver helps with this problem. Also, eating smaller meals 4 to 5 times a day helps.  Constipation can be treated with a stool softener or adding bulk to your diet. Drinking lots of fluids, and eating vegetables, fruits, and whole grains are helpful.  Exercising is also helpful. If you have been very active up until your pregnancy, most of these activities can be continued during your pregnancy. If you have been less active, it is helpful to start an exercise program such as walking.  Hemorrhoids may develop at the end of the second trimester. Warm sitz baths and hemorrhoid cream recommended by your caregiver helps hemorrhoid problems.  Backaches may develop during this time of your pregnancy. Avoid heavy lifting, wear low heal shoes, and practice good posture to help with backache problems.  Some pregnant women develop tingling and numbness of their hand and fingers because of swelling and tightening of ligaments in the wrist (carpel tunnel syndrome). This goes away after the baby is born.  As your breasts enlarge, you may have to get a bigger bra. Get a comfortable, cotton, support bra. Do not get a nursing bra until the last month of the pregnancy if you will be nursing the baby.  You may get a dark line from your belly button to the pubic area called the linea nigra.  You may develop rosy cheeks because of increase blood flow to the face.  You may develop spider looking lines of the face, neck, arms, and chest. These go away after the baby is born. HOME CARE INSTRUCTIONS   It is extremely important to avoid all smoking, herbs, alcohol, and unprescribed drugs during your pregnancy. These chemicals affect the formation and growth of the baby. Avoid these chemicals throughout the pregnancy to ensure the delivery of a healthy infant.  Most of your home care instructions are the same as suggested for the first trimester of your  pregnancy. Keep your caregiver's appointments. Follow your caregiver's instructions regarding medicine use, exercise, and diet.  During pregnancy, you are providing food for you and your baby. Continue to eat regular, well-balanced meals. Choose foods such as meat, fish, milk and other low fat dairy products, vegetables, fruits, and whole-grain breads and cereals. Your caregiver will tell you of the ideal weight gain.  A physical sexual relationship may be continued up until near the end of pregnancy if there are no other problems. Problems could include early (premature) leaking of amniotic fluid from the membranes, vaginal bleeding, abdominal pain, or other medical or pregnancy problems.  Exercise regularly if there are no restrictions. Check with your caregiver if you are unsure of the safety of some of your exercises. The greatest weight gain will occur in the last 2 trimesters of pregnancy. Exercise will help you:  Control your weight.  Get you in shape for labor and delivery.  Lose weight after you have the baby.  Wear   a good support or jogging bra for breast tenderness during pregnancy. This may help if worn during sleep. Pads or tissues may be used in the bra if you are leaking colostrum.  Do not use hot tubs, steam rooms or saunas throughout the pregnancy.  Wear your seat belt at all times when driving. This protects you and your baby if you are in an accident.  Avoid raw meat, uncooked cheese, cat litter boxes, and soil used by cats. These carry germs that can cause birth defects in the baby.  The second trimester is also a good time to visit your dentist for your dental health if this has not been done yet. Getting your teeth cleaned is okay. Use a soft toothbrush. Brush gently during pregnancy.  It is easier to leak urine during pregnancy. Tightening up and strengthening the pelvic muscles will help with this problem. Practice stopping your urination while you are going to the  bathroom. These are the same muscles you need to strengthen. It is also the muscles you would use as if you were trying to stop from passing gas. You can practice tightening these muscles up 10 times a set and repeating this about 3 times per day. Once you know what muscles to tighten up, do not perform these exercises during urination. It is more likely to contribute to an infection by backing up the urine.  Ask for help if you have financial, counseling, or nutritional needs during pregnancy. Your caregiver will be able to offer counseling for these needs as well as refer you for other special needs.  Your skin may become oily. If so, wash your face with mild soap, use non-greasy moisturizer and oil or cream based makeup. MEDICINES AND DRUG USE IN PREGNANCY  Take prenatal vitamins as directed. The vitamin should contain 1 milligram of folic acid. Keep all vitamins out of reach of children. Only a couple vitamins or tablets containing iron may be fatal to a baby or young child when ingested.  Avoid use of all medicines, including herbs, over-the-counter medicines, not prescribed or suggested by your caregiver. Only take over-the-counter or prescription medicines for pain, discomfort, or fever as directed by your caregiver. Do not use aspirin.  Let your caregiver also know about herbs you may be using.  Alcohol is related to a number of birth defects. This includes fetal alcohol syndrome. All alcohol, in any form, should be avoided completely. Smoking will cause low birth rate and premature babies.  Street or illegal drugs are very harmful to the baby. They are absolutely forbidden. A baby born to an addicted mother will be addicted at birth. The baby will go through the same withdrawal an adult does. SEEK MEDICAL CARE IF:  You have any concerns or worries during your pregnancy. It is better to call with your questions if you feel they cannot wait, rather than worry about them. SEEK IMMEDIATE  MEDICAL CARE IF:   An unexplained oral temperature above 102 F (38.9 C) develops, or as your caregiver suggests.  You have leaking of fluid from the vagina (birth canal). If leaking membranes are suspected, take your temperature and tell your caregiver of this when you call.  There is vaginal spotting, bleeding, or passing clots. Tell your caregiver of the amount and how many pads are used. Light spotting in pregnancy is common, especially following intercourse.  You develop a bad smelling vaginal discharge with a change in the color from clear to white.  You continue to feel   sick to your stomach (nauseated) and have no relief from remedies suggested. You vomit blood or coffee ground-like materials.  You lose more than 2 pounds of weight or gain more than 2 pounds of weight over 1 week, or as suggested by your caregiver.  You notice swelling of your face, hands, feet, or legs.  You get exposed to German measles and have never had them.  You are exposed to fifth disease or chickenpox.  You develop belly (abdominal) pain. Round ligament discomfort is a common non-cancerous (benign) cause of abdominal pain in pregnancy. Your caregiver still must evaluate you.  You develop a bad headache that does not go away.  You develop fever, diarrhea, pain with urination, or shortness of breath.  You develop visual problems, blurry, or double vision.  You fall or are in a car accident or any kind of trauma.  There is mental or physical violence at home. Document Released: 04/07/2001 Document Revised: 01/06/2012 Document Reviewed: 10/10/2008 Exit[employer] Patient Information 2014 [employer].  

## 2012-12-02 NOTE — Progress Notes (Signed)
sono reviewed see report, pt aware 1st NT done

## 2012-12-02 NOTE — Progress Notes (Signed)
C/o cramps and back pain

## 2012-12-25 ENCOUNTER — Encounter (HOSPITAL_COMMUNITY): Payer: Self-pay

## 2012-12-25 ENCOUNTER — Inpatient Hospital Stay (HOSPITAL_COMMUNITY)
Admission: AD | Admit: 2012-12-25 | Discharge: 2012-12-25 | Disposition: A | Discharge status: Home / Self Care | Payer: Medicaid Other | Source: Ambulatory Visit | Attending: Obstetrics & Gynecology | Admitting: Obstetrics & Gynecology

## 2012-12-25 DIAGNOSIS — O99891 Other specified diseases and conditions complicating pregnancy: Secondary | ICD-10-CM | POA: Insufficient documentation

## 2012-12-25 DIAGNOSIS — R109 Unspecified abdominal pain: Secondary | ICD-10-CM | POA: Insufficient documentation

## 2012-12-25 DIAGNOSIS — N949 Unspecified condition associated with female genital organs and menstrual cycle: Secondary | ICD-10-CM | POA: Insufficient documentation

## 2012-12-25 LAB — URINALYSIS, ROUTINE W REFLEX MICROSCOPIC
Leukocytes, UA: NEGATIVE
Nitrite: NEGATIVE
Protein, ur: NEGATIVE mg/dL
Specific Gravity, Urine: >1.03 — ABNORMAL HIGH (ref 1.005–1.030)
Urobilinogen, UA: 0.2 mg/dL (ref 0.0–1.0)

## 2012-12-25 NOTE — MAU Note (Signed)
Pain on left lower portion of abdomen only, does feel pressure with voiding. Does note non-odorous clear vaginal discharge.

## 2012-12-25 NOTE — MAU Note (Signed)
Pt reports having left lower abd pin on and off for the past 4 days. Pain is sharp and radiates towards  her back.

## 2013-01-02 ENCOUNTER — Ambulatory Visit (INDEPENDENT_AMBULATORY_CARE_PROVIDER_SITE_OTHER): Payer: Medicaid Other | Admitting: Women's Health

## 2013-01-02 ENCOUNTER — Other Ambulatory Visit: Payer: Self-pay | Admitting: Women's Health

## 2013-01-02 VITALS — BP 118/74 | Wt 128.4 lb

## 2013-01-02 DIAGNOSIS — F1920 Other psychoactive substance dependence, uncomplicated: Secondary | ICD-10-CM

## 2013-01-02 DIAGNOSIS — Z3490 Encounter for supervision of normal pregnancy, unspecified, unspecified trimester: Secondary | ICD-10-CM

## 2013-01-02 DIAGNOSIS — O360990 Maternal care for other rhesus isoimmunization, unspecified trimester, not applicable or unspecified: Secondary | ICD-10-CM

## 2013-01-02 DIAGNOSIS — Z3482 Encounter for supervision of other normal pregnancy, second trimester: Secondary | ICD-10-CM

## 2013-01-02 DIAGNOSIS — O09299 Supervision of pregnancy with other poor reproductive or obstetric history, unspecified trimester: Secondary | ICD-10-CM

## 2013-01-02 DIAGNOSIS — Z1389 Encounter for screening for other disorder: Secondary | ICD-10-CM

## 2013-01-02 DIAGNOSIS — O360121 Maternal care for anti-D [Rh] antibodies, second trimester, fetus 1: Secondary | ICD-10-CM

## 2013-01-02 DIAGNOSIS — Z331 Pregnant state, incidental: Secondary | ICD-10-CM

## 2013-01-02 DIAGNOSIS — F1290 Cannabis use, unspecified, uncomplicated: Secondary | ICD-10-CM

## 2013-01-02 LAB — POCT URINALYSIS DIPSTICK
Blood, UA: NEGATIVE
Glucose, UA: NEGATIVE
Nitrite, UA: NEGATIVE

## 2013-01-02 NOTE — Progress Notes (Signed)
Pt states went to [facility] on Sunday for lower abdominal pain and pressure was told to increase fluids due to dehydration. Pt states has felt better since she increased her fluid intake per [facility] 2nd IT today

## 2013-01-02 NOTE — Progress Notes (Signed)
Denies uc's, lof, vb, urinary frequency, urgency, hesitancy, or dysuria.  RLP.  Reviewed RLP relief measures, warning s/s to report.  All questions answered. 2nd IT today. F/U in 3wks for anatomy u/s and visit.

## 2013-01-02 NOTE — Patient Instructions (Signed)
Pregnancy - Second Trimester The second trimester of pregnancy (3 to 6 months) is a period of rapid growth for you and your baby. At the end of the sixth month, your baby is about 9 inches long and weighs 1 1/2 pounds. You will begin to feel the baby move between 18 and 20 weeks of the pregnancy. This is called quickening. Weight gain is faster. A clear fluid (colostrum) may leak out of your breasts. You may feel small contractions of the womb (uterus). This is known as false labor or Braxton-Hicks contractions. This is like a practice for labor when the baby is ready to be born. Usually, the problems with morning sickness have usually passed by the end of your first trimester. Some women develop small dark blotches (called cholasma, mask of pregnancy) on their face that usually goes away after the baby is born. Exposure to the sun makes the blotches worse. Acne may also develop in some pregnant women and pregnant women who have acne, may find that it goes away. PRENATAL EXAMS  Blood work may continue to be done during prenatal exams. These tests are done to check on your health and the probable health of your baby. Blood work is used to follow your blood levels (hemoglobin). Anemia (low hemoglobin) is common during pregnancy. Iron and vitamins are given to help prevent this. You will also be checked for diabetes between 24 and 28 weeks of the pregnancy. Some of the previous blood tests may be repeated.  The size of the uterus is measured during each visit. This is to make sure that the baby is continuing to grow properly according to the dates of the pregnancy.  Your blood pressure is checked every prenatal visit. This is to make sure you are not getting toxemia.  Your urine is checked to make sure you do not have an infection, diabetes or protein in the urine.  Your weight is checked often to make sure gains are happening at the suggested rate. This is to ensure that both you and your baby are  growing normally.  Sometimes, an ultrasound is performed to confirm the proper growth and development of the baby. This is a test which bounces harmless sound waves off the baby so your caregiver can more accurately determine due dates. Sometimes, a test is done on the amniotic fluid surrounding the baby. This test is called an amniocentesis. The amniotic fluid is obtained by sticking a needle into the belly (abdomen). This is done to check the chromosomes in instances where there is a concern about possible genetic problems with the baby. It is also sometimes done near the end of pregnancy if an early delivery is required. In this case, it is done to help make sure the baby's lungs are mature enough for the baby to live outside of the womb. CHANGES OCCURING IN THE SECOND TRIMESTER OF PREGNANCY Your body goes through many changes during pregnancy. They vary from person to person. Talk to your caregiver about changes you notice that you are concerned about.  During the second trimester, you will likely have an increase in your appetite. It is normal to have cravings for certain foods. This varies from person to person and pregnancy to pregnancy.  Your lower abdomen will begin to bulge.  You may have to urinate more often because the uterus and baby are pressing on your bladder. It is also common to get more bladder infections during pregnancy. You can help this by drinking lots of fluids   and emptying your bladder before and after intercourse.  You may begin to get stretch marks on your hips, abdomen, and breasts. These are normal changes in the body during pregnancy. There are no exercises or medicines to take that prevent this change.  You may begin to develop swollen and bulging veins (varicose veins) in your legs. Wearing support hose, elevating your feet for 15 minutes, 3 to 4 times a day and limiting salt in your diet helps lessen the problem.  Heartburn may develop as the uterus grows and  pushes up against the stomach. Antacids recommended by your caregiver helps with this problem. Also, eating smaller meals 4 to 5 times a day helps.  Constipation can be treated with a stool softener or adding bulk to your diet. Drinking lots of fluids, and eating vegetables, fruits, and whole grains are helpful.  Exercising is also helpful. If you have been very active up until your pregnancy, most of these activities can be continued during your pregnancy. If you have been less active, it is helpful to start an exercise program such as walking.  Hemorrhoids may develop at the end of the second trimester. Warm sitz baths and hemorrhoid cream recommended by your caregiver helps hemorrhoid problems.  Backaches may develop during this time of your pregnancy. Avoid heavy lifting, wear low heal shoes, and practice good posture to help with backache problems.  Some pregnant women develop tingling and numbness of their hand and fingers because of swelling and tightening of ligaments in the wrist (carpel tunnel syndrome). This goes away after the baby is born.  As your breasts enlarge, you may have to get a bigger bra. Get a comfortable, cotton, support bra. Do not get a nursing bra until the last month of the pregnancy if you will be nursing the baby.  You may get a dark line from your belly button to the pubic area called the linea nigra.  You may develop rosy cheeks because of increase blood flow to the face.  You may develop spider looking lines of the face, neck, arms, and chest. These go away after the baby is born. HOME CARE INSTRUCTIONS   It is extremely important to avoid all smoking, herbs, alcohol, and unprescribed drugs during your pregnancy. These chemicals affect the formation and growth of the baby. Avoid these chemicals throughout the pregnancy to ensure the delivery of a healthy infant.  Most of your home care instructions are the same as suggested for the first trimester of your  pregnancy. Keep your caregiver's appointments. Follow your caregiver's instructions regarding medicine use, exercise, and diet.  During pregnancy, you are providing food for you and your baby. Continue to eat regular, well-balanced meals. Choose foods such as meat, fish, milk and other low fat dairy products, vegetables, fruits, and whole-grain breads and cereals. Your caregiver will tell you of the ideal weight gain.  A physical sexual relationship may be continued up until near the end of pregnancy if there are no other problems. Problems could include early (premature) leaking of amniotic fluid from the membranes, vaginal bleeding, abdominal pain, or other medical or pregnancy problems.  Exercise regularly if there are no restrictions. Check with your caregiver if you are unsure of the safety of some of your exercises. The greatest weight gain will occur in the last 2 trimesters of pregnancy. Exercise will help you:  Control your weight.  Get you in shape for labor and delivery.  Lose weight after you have the baby.  Wear   a good support or jogging bra for breast tenderness during pregnancy. This may help if worn during sleep. Pads or tissues may be used in the bra if you are leaking colostrum.  Do not use hot tubs, steam rooms or saunas throughout the pregnancy.  Wear your seat belt at all times when driving. This protects you and your baby if you are in an accident.  Avoid raw meat, uncooked cheese, cat litter boxes, and soil used by cats. These carry germs that can cause birth defects in the baby.  The second trimester is also a good time to visit your dentist for your dental health if this has not been done yet. Getting your teeth cleaned is okay. Use a soft toothbrush. Brush gently during pregnancy.  It is easier to leak urine during pregnancy. Tightening up and strengthening the pelvic muscles will help with this problem. Practice stopping your urination while you are going to the  bathroom. These are the same muscles you need to strengthen. It is also the muscles you would use as if you were trying to stop from passing gas. You can practice tightening these muscles up 10 times a set and repeating this about 3 times per day. Once you know what muscles to tighten up, do not perform these exercises during urination. It is more likely to contribute to an infection by backing up the urine.  Ask for help if you have financial, counseling, or nutritional needs during pregnancy. Your caregiver will be able to offer counseling for these needs as well as refer you for other special needs.  Your skin may become oily. If so, wash your face with mild soap, use non-greasy moisturizer and oil or cream based makeup. MEDICINES AND DRUG USE IN PREGNANCY  Take prenatal vitamins as directed. The vitamin should contain 1 milligram of folic acid. Keep all vitamins out of reach of children. Only a couple vitamins or tablets containing iron may be fatal to a baby or young child when ingested.  Avoid use of all medicines, including herbs, over-the-counter medicines, not prescribed or suggested by your caregiver. Only take over-the-counter or prescription medicines for pain, discomfort, or fever as directed by your caregiver. Do not use aspirin.  Let your caregiver also know about herbs you may be using.  Alcohol is related to a number of birth defects. This includes fetal alcohol syndrome. All alcohol, in any form, should be avoided completely. Smoking will cause low birth rate and premature babies.  Street or illegal drugs are very harmful to the baby. They are absolutely forbidden. A baby born to an addicted mother will be addicted at birth. The baby will go through the same withdrawal an adult does. SEEK MEDICAL CARE IF:  You have any concerns or worries during your pregnancy. It is better to call with your questions if you feel they cannot wait, rather than worry about them. SEEK IMMEDIATE  MEDICAL CARE IF:   An unexplained oral temperature above 102 F (38.9 C) develops, or as your caregiver suggests.  You have leaking of fluid from the vagina (birth canal). If leaking membranes are suspected, take your temperature and tell your caregiver of this when you call.  There is vaginal spotting, bleeding, or passing clots. Tell your caregiver of the amount and how many pads are used. Light spotting in pregnancy is common, especially following intercourse.  You develop a bad smelling vaginal discharge with a change in the color from clear to white.  You continue to feel   sick to your stomach (nauseated) and have no relief from remedies suggested. You vomit blood or coffee ground-like materials.  You lose more than 2 pounds of weight or gain more than 2 pounds of weight over 1 week, or as suggested by your caregiver.  You notice swelling of your face, hands, feet, or legs.  You get exposed to German measles and have never had them.  You are exposed to fifth disease or chickenpox.  You develop belly (abdominal) pain. Round ligament discomfort is a common non-cancerous (benign) cause of abdominal pain in pregnancy. Your caregiver still must evaluate you.  You develop a bad headache that does not go away.  You develop fever, diarrhea, pain with urination, or shortness of breath.  You develop visual problems, blurry, or double vision.  You fall or are in a car accident or any kind of trauma.  There is mental or physical violence at home. Document Released: 04/07/2001 Document Revised: 01/06/2012 Document Reviewed: 10/10/2008 Exit[employer] Patient Information 2014 [employer].  

## 2013-01-07 ENCOUNTER — Encounter: Payer: Self-pay | Admitting: Women's Health

## 2013-01-07 LAB — MATERNAL SCREEN, INTEGRATED #2
AFP MoM: 0.67
AFP, Serum: 32 ng/mL
Calculated Gestational Age: 16.4
Crown Rump Length: 56.6 mm
Inhibin A Dimeric: 264 pg/mL
NT MoM: 1.11
Nuchal Translucency: 1.47 mm
Rish for ONTD: <1:5000 {titer}
hCG MoM: 1.41
hCG, Serum: 56.1 IU/mL

## 2013-01-23 ENCOUNTER — Ambulatory Visit (INDEPENDENT_AMBULATORY_CARE_PROVIDER_SITE_OTHER): Payer: Medicaid Other | Admitting: Women's Health

## 2013-01-23 ENCOUNTER — Other Ambulatory Visit: Payer: Self-pay | Admitting: Women's Health

## 2013-01-23 ENCOUNTER — Other Ambulatory Visit: Payer: Self-pay | Admitting: Obstetrics & Gynecology

## 2013-01-23 ENCOUNTER — Ambulatory Visit (INDEPENDENT_AMBULATORY_CARE_PROVIDER_SITE_OTHER): Payer: Medicaid Other

## 2013-01-23 VITALS — BP 134/74 | Wt 130.6 lb

## 2013-01-23 DIAGNOSIS — Z1389 Encounter for screening for other disorder: Secondary | ICD-10-CM

## 2013-01-23 DIAGNOSIS — O360990 Maternal care for other rhesus isoimmunization, unspecified trimester, not applicable or unspecified: Secondary | ICD-10-CM

## 2013-01-23 DIAGNOSIS — O09299 Supervision of pregnancy with other poor reproductive or obstetric history, unspecified trimester: Secondary | ICD-10-CM

## 2013-01-23 DIAGNOSIS — Z3482 Encounter for supervision of other normal pregnancy, second trimester: Secondary | ICD-10-CM

## 2013-01-23 DIAGNOSIS — Z331 Pregnant state, incidental: Secondary | ICD-10-CM

## 2013-01-23 DIAGNOSIS — O26872 Cervical shortening, second trimester: Secondary | ICD-10-CM

## 2013-01-23 DIAGNOSIS — F1920 Other psychoactive substance dependence, uncomplicated: Secondary | ICD-10-CM

## 2013-01-23 DIAGNOSIS — O26879 Cervical shortening, unspecified trimester: Secondary | ICD-10-CM

## 2013-01-23 LAB — POCT URINALYSIS DIPSTICK
Glucose, UA: NEGATIVE
Leukocytes, UA: NEGATIVE
Nitrite, UA: NEGATIVE

## 2013-01-23 NOTE — Progress Notes (Signed)
Reports good fm. Denies uc's, lof, vb, urinary frequency, urgency, hesitancy, or dysuria, abnormal/malodorous vag d/c or vulvovaginal itching/irritation.  No complaints.  Reviewed today's u/s including shortened cx, w/ no h/o PTB, will just recheck in 2wks, also reviewed ptl s/s, fm.  All questions answered. F/U in 2wks for CL u/s, and visit.

## 2013-01-23 NOTE — Patient Instructions (Signed)
[facility] [phone]          [facility] [phone]               [facility] [phone]   Preterm Labor Preterm labor is when labor starts at less than 37 weeks of pregnancy. The normal length of a pregnancy is 39 to 41 weeks. CAUSES Often, there is no identifiable underlying cause as to why a woman goes into preterm labor. However, one of the most common known causes of preterm labor is infection. Infections of the uterus, cervix, vagina, amniotic sac, bladder, kidney, or even the lungs (pneumonia) can cause labor to start. Other causes of preterm labor include:  Urogenital infections, such as yeast infections and bacterial vaginosis.  Uterine abnormalities (uterine shape, uterine septum, fibroids, bleeding from the placenta).  A cervix that has been operated on and opens prematurely.  Malformations in the baby.  Multiple gestations (twins, triplets, and so on).  Breakage of the amniotic sac. Additional risk factors for preterm labor include:  Previous history of preterm labor.  Premature rupture of membranes (PROM).  A placenta that covers the opening of the cervix (placenta previa).  A placenta that separates from the uterus (placenta abruption).  A cervix that is too weak to hold the baby in the uterus (incompetence cervix).  Having too much fluid in the amniotic sac (polyhydramnios).  Taking illegal drugs or smoking while pregnant.  Not gaining enough weight while pregnant.  Women younger than 18 and older than 25 years old.  Low socioeconomic status.  African-American ethnicity. SYMPTOMS Signs and symptoms of preterm labor include:  Menstrual-like cramps.  Contractions that are 30 to 70 seconds apart, become very regular, closer together, and are more intense and painful.  Contractions that start on the top of the uterus and spread down to the lower abdomen and back.  A sense of increased pelvic  pressure or back pain.  A watery or bloody discharge that comes from the vagina. DIAGNOSIS  A diagnosis can be confirmed by:  A vaginal exam.  An ultrasound of the cervix.  Sampling (swabbing) cervico-vaginal secretions. These samples can be tested for the presence of fetal fibronectin. This is a protein found in cervical discharge which is associated with preterm labor.  Fetal monitoring. TREATMENT  Depending on the length of the pregnancy and other circumstances, a caregiver may suggest bed rest. If necessary, there are medicines that can be given to stop contractions and to quicken fetal lung maturity. If labor happens before 34 weeks of pregnancy, a prolonged hospital stay may be recommended. Treatment depends on the condition of both the mother and baby. PREVENTION There are some things a mother can do to lower the risk of preterm labor in future pregnancies. A woman can:   Stop smoking.  Maintain healthy weight gain and avoid chemicals and drugs that are not necessary.  Be watchful for any type of infection.  Inform her caregiver if she has a known history of preterm labor. Document Released: 07/04/2003 Document Revised: 07/06/2011 Document Reviewed: 08/08/2010 [employer] Patient Information 2014 [employer].  

## 2013-01-23 NOTE — Progress Notes (Signed)
U/S(19+5wks)-active fetus, meas c/w dates, fluid wnl, ant gr 0 plac, no major abnl noted, bilateral adnexa wnl, female fetus, CX=2.3cm closed no change noted with fundal pressure applied

## 2013-01-23 NOTE — Progress Notes (Signed)
C/o stuffy nose

## 2013-01-24 ENCOUNTER — Inpatient Hospital Stay (HOSPITAL_COMMUNITY)
Admission: AD | Admit: 2013-01-24 | Discharge: 2013-01-24 | Disposition: A | Discharge status: Home / Self Care | Payer: Medicaid Other | Source: Ambulatory Visit | Attending: Obstetrics and Gynecology | Admitting: Obstetrics and Gynecology

## 2013-01-24 ENCOUNTER — Encounter (HOSPITAL_COMMUNITY): Payer: Self-pay | Admitting: *Deleted

## 2013-01-24 ENCOUNTER — Inpatient Hospital Stay (HOSPITAL_COMMUNITY): Payer: Medicaid Other

## 2013-01-24 DIAGNOSIS — O26872 Cervical shortening, second trimester: Secondary | ICD-10-CM

## 2013-01-24 DIAGNOSIS — N949 Unspecified condition associated with female genital organs and menstrual cycle: Secondary | ICD-10-CM | POA: Insufficient documentation

## 2013-01-24 DIAGNOSIS — R51 Headache: Secondary | ICD-10-CM | POA: Insufficient documentation

## 2013-01-24 DIAGNOSIS — O26879 Cervical shortening, unspecified trimester: Secondary | ICD-10-CM

## 2013-01-24 DIAGNOSIS — R109 Unspecified abdominal pain: Secondary | ICD-10-CM | POA: Insufficient documentation

## 2013-01-24 DIAGNOSIS — M549 Dorsalgia, unspecified: Secondary | ICD-10-CM | POA: Insufficient documentation

## 2013-01-24 LAB — URINALYSIS, ROUTINE W REFLEX MICROSCOPIC
Nitrite: NEGATIVE
Protein, ur: NEGATIVE mg/dL
Specific Gravity, Urine: 1.015 (ref 1.005–1.030)
Urobilinogen, UA: 0.2 mg/dL (ref 0.0–1.0)

## 2013-01-24 LAB — WET PREP, GENITAL
Trich, Wet Prep: NONE SEEN
Yeast Wet Prep HPF POC: NONE SEEN

## 2013-01-24 MED ORDER — PROGESTERONE 200 MG VA SUPP: 200.0000 mg | Freq: Every day | VAGINAL | Status: DC

## 2013-01-24 MED ORDER — ACETAMINOPHEN 500 MG PO TABS
1000.0000 mg | ORAL_TABLET | Freq: Once | ORAL | Status: AC
  Administered 2013-01-24: 1000 mg via ORAL

## 2013-01-24 NOTE — MAU Provider Note (Signed)
History     CSN: [id number]  Arrival date and time: 01/24/13 1604   First Provider Initiated Contact with Patient 01/24/13 1701      Chief Complaint  Patient presents with  . Abdominal Pain  . Back Pain   HPI  [name] is a [id number] at [redacted]w[redacted]d who presents with pelvic pressure. [facility] and had an [place] that showed a cervical length of 2.3 cm. She was told to come back to the office in 2 weeks for a repeat cervical length. The patient was upset with her care and [facility] and [facility]. [facility] oct [facility]. She reports good fetal movement, denies LOF, vaginal bleeding, vaginal itching/burning, urinary symptoms, h/a, dizziness, n/v, or fever/chills.  She currently rates her pelvic pain/pressure a 6/10.    OB History   Grav Para Term Preterm Abortions TAB SAB Ect Mult Living   4 1 1  2 1 1   1       Past Medical History  Diagnosis Date  . No pertinent past medical history   . 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 Chlamydia     Past Surgical History  Procedure Laterality Date  . Dilation and curettage of uterus    . Cholecystectomy      History reviewed. No pertinent family history.  History  Substance Use Topics  . Smoking status: Former Smoker -- 0.25 packs/day for 1 years    Types: Cigarettes    Quit date: 09/25/2012  . Smokeless tobacco: Never Used  . Alcohol Use: No    Allergies: No Known Allergies  Prescriptions prior to admission  Medication Sig Dispense Refill  . acetaminophen (TYLENOL) 325 MG tablet Take 650 mg by mouth every 6 (six) hours as needed for pain (headache).      Marland Kitchen acyclovir (ZOVIRAX) 400 MG tablet Take 400 mg by mouth 2 (two) times daily.      . Prenatal Vit-Fe Fumarate-FA (PRENATAL MULTIVITAMIN) TABS Take 1 tablet by mouth at bedtime.      . promethazine (PHENERGAN) 25 MG tablet Take 2 tablets (50 mg total) by mouth every 6  (six) hours as needed for nausea.  30 tablet  0   Results for orders placed during the hospital encounter of 01/24/13 (from the past 24 hour(s))  URINALYSIS, ROUTINE W REFLEX MICROSCOPIC     Status: None   Collection Time    01/24/13  4:25 PM      Result Value Range   Color, Urine YELLOW  YELLOW   APPearance CLEAR  CLEAR   Specific Gravity, Urine 1.015  1.005 - 1.030   pH 6.5  5.0 - 8.0   Glucose, UA NEGATIVE  NEGATIVE mg/dL   Hgb urine dipstick NEGATIVE  NEGATIVE   Bilirubin Urine NEGATIVE  NEGATIVE   Ketones, ur NEGATIVE  NEGATIVE mg/dL   Protein, ur NEGATIVE  NEGATIVE mg/dL   Urobilinogen, UA 0.2  0.0 - 1.0 mg/dL   Nitrite NEGATIVE  NEGATIVE   Leukocytes, UA NEGATIVE  NEGATIVE  WET PREP, GENITAL     Status: Abnormal   Collection Time    01/24/13  5:15 PM      Result Value Range   Yeast Wet Prep HPF POC NONE SEEN  NONE SEEN   Trich, Wet Prep NONE SEEN  NONE SEEN   Clue Cells Wet Prep HPF POC FEW (*) NONE  SEEN   WBC, Wet Prep HPF POC FEW (*) NONE SEEN   US Ob Detail + 14 Wk  01/23/2013    DETAILED SECOND TRIMESTER SONOGRAM  AUDREA Mullen is in the office for detailed second trimester sonogram.  She is a 27 y.o. year old [id number] with Estimated Date of Delivery: 06/14/13  by LMP, early ultrasound now at  [redacted]w[redacted]d weeks gestation. Thus far the  pregnancy has been complicated by [facility] and h/o AB.   GESTATION: SINGLETON  PRESENTATION: breech   FETAL ACTIVITY:          Heart rate         158 bpm          The fetus is active.  AMNIOTIC FLUID: The amniotic fluid volume is  normal,   PLACENTA LOCALIZATION:  anterior GRADE 0  CERVIX: Measures 2.3 cm (measured vaginally) closed no change noted with fundal  pressure applied  ADNEXA: The ovaries are normal.   GESTATIONAL AGE AND  BIOMETRICS:  Gestational criteria: Estimated Date of Delivery: 06/14/13 by early  ultrasound now at [redacted]w[redacted]d  Previous Scans:2              BIPARIETAL DIAMETER           4.38 cm         19+2 weeks  HEAD CIRCUMFERENCE            16.6 cm         19+2 weeks  ABDOMINAL CIRCUMFERENCE           14.75 cm         20+0 weeks  FEMUR LENGTH           3.14 cm         19+5 weeks                                                           AVERAGE EGA(BY THIS SCAN):   19+4 weeks                                                 ESTIMATED FETAL WEIGHT:        321  grams,   ANATOMICAL SURVEY                                                                             COMMENTS CEREBRAL VENTRICLES yes normal   CHOROID PLEXUS yes normal   CEREBELLUM yes normal   CISTERNA MAGNA yes normal   NUCHAL REGION yes normal   ORBITS yes normal   NASAL BONE yes normal   NOSE/LIP yes normal   FACIAL PROFILE yes normal   4 CHAMBERED HEART yes normal   OUTFLOW TRACTS yes normal   DIAPHRAGM yes normal   STOMACH yes normal   RENAL REGION yes normal   BLADDER yes normal   CORD INSERTION yes  normal   3 VESSEL CORD yes normal   SPINE yes normal   ARMS/HANDS yes normal   LEGS/FEET yes normal   GENITALIA yes normal female        SUSPECTED ABNORMALITIES:  no  QUALITY OF SCAN: satisfactory   TECHNICIAN COMMENTS:  U/S(19+5wks)-active fetus, meas c/w dates, fluid wnl, ant gr 0 plac, no  major abnl noted, bilateral adnexa wnl, female fetus, CX=2.3cm closed no  change noted with fundal pressure applied     A copy of this report including all images has been saved and backed up to  a second source for retrieval if needed. All measures and details of the  anatomical scan, placentation, fluid volume and pelvic anatomy are  contained in that report.  Chari Manning 01/23/2013 10:41 AM           US Ob Transvaginal  01/23/2013    DETAILED SECOND TRIMESTER SONOGRAM  Ruth Mullen is in the office for detailed second trimester sonogram.  She is a 27 y.o. year old [id number] with Estimated Date of Delivery: 06/14/13  by LMP, early ultrasound now at  [redacted]w[redacted]d weeks gestation. Thus far the  pregnancy has been complicated by [facility] and h/o AB.   GESTATION: SINGLETON  PRESENTATION: breech   FETAL ACTIVITY:           Heart rate         158 bpm          The fetus is active.  AMNIOTIC FLUID: The amniotic fluid volume is  normal,   PLACENTA LOCALIZATION:  anterior GRADE 0  CERVIX: Measures 2.3 cm (measured vaginally) closed no change noted with fundal  pressure applied  ADNEXA: The ovaries are normal.   GESTATIONAL AGE AND  BIOMETRICS:  Gestational criteria: Estimated Date of Delivery: 06/14/13 by early  ultrasound now at [redacted]w[redacted]d  Previous Scans:2              BIPARIETAL DIAMETER           4.38 cm         19+2 weeks  HEAD CIRCUMFERENCE           16.6 cm         19+2 weeks  ABDOMINAL CIRCUMFERENCE           14.75 cm         20+0 weeks  FEMUR LENGTH           3.14 cm         19+5 weeks                                                           AVERAGE EGA(BY THIS SCAN):   19+4 weeks                                                 ESTIMATED FETAL WEIGHT:        321  grams,   ANATOMICAL SURVEY  COMMENTS CEREBRAL VENTRICLES yes normal   CHOROID PLEXUS yes normal   CEREBELLUM yes normal   CISTERNA MAGNA yes normal   NUCHAL REGION yes normal   ORBITS yes normal   NASAL BONE yes normal   NOSE/LIP yes normal   FACIAL PROFILE yes normal   4 CHAMBERED HEART yes normal   OUTFLOW TRACTS yes normal   DIAPHRAGM yes normal   STOMACH yes normal   RENAL REGION yes normal   BLADDER yes normal   CORD INSERTION yes normal   3 VESSEL CORD yes normal   SPINE yes normal   ARMS/HANDS yes normal   LEGS/FEET yes normal   GENITALIA yes normal female        SUSPECTED ABNORMALITIES:  no  QUALITY OF SCAN: satisfactory   TECHNICIAN COMMENTS:  U/S(19+5wks)-active fetus, meas c/w dates, fluid wnl, ant gr 0 plac, no  major abnl noted, bilateral adnexa wnl, female fetus, CX=2.3cm closed no  change noted with fundal pressure applied     A copy of this report including all images has been saved and backed up to  a second source for retrieval if needed. All measures and details of the   anatomical scan, placentation, fluid volume and pelvic anatomy are  contained in that report.  Chari Manning 01/23/2013 10:41 AM           Review of Systems  Constitutional: Negative for fever and chills.  Gastrointestinal: Positive for abdominal pain. Negative for nausea, vomiting, diarrhea and constipation.  Genitourinary: Negative for dysuria, urgency, frequency and hematuria.   Physical Exam   Blood pressure 110/84, pulse 110, temperature 98 F (36.7 C), temperature source Oral, resp. rate 16, height 5\' 1"  (1.549 m), weight 59.603 kg (131 lb 6.4 oz), last menstrual period 09/07/2012, SpO2 99.00%. Fetal heart tones by doppler 164 bpm   Physical Exam  Constitutional: She is oriented to person, place, and time. She appears well-developed and well-nourished. No distress.  Respiratory: Effort normal.  GI: Soft. She exhibits no distension. There is tenderness. There is no rebound and no guarding.  Suprapubic tenderness   Genitourinary: Vaginal discharge found.  Speculum exam: Vagina - Moderate amount of creamy, bubbly discharge, no odor Cervix - No contact bleeding Bimanual exam: Cervix FT, posterior  Uterus non tender, normal size for gestational age Adnexa non tender, no masses bilaterally GC/Chlam, wet prep done Chaperone present for exam.   Musculoskeletal: Normal range of motion.  Neurological: She is alert and oriented to person, place, and time.  Skin: Skin is warm. She is diaphoretic.  Psychiatric: Her speech is normal. Her mood appears anxious.  Pt is tearful    MAU Course  Procedures  MDM +fht UA Wet prep GC/Chlamydia   [place]  Consulted with Dr. Debroah Loop regarding patients plan of care.  Assessment and Plan  A: Shortened cervical length 2.8 cm based on [place] 01/24/2013  P: Discharge home Preterm labor precautions discussed  RX: Progesterone 200 mg vaginal suppository at HS (#30) 3 rf Return to MAU with worsening symptoms Keep your appointment with Dr. Arelia Sneddon  on 02/06/2013 Ok to take unisom at night to help sleep Pelvic rest discussed   Erving Sassano IRENE FNP-C 01/24/2013, 8:00 PM

## 2013-01-24 NOTE — Progress Notes (Signed)
Pt states she rates her headache 3-4

## 2013-01-24 NOTE — MAU Note (Signed)
Patient states she started her prenatal care at [facility], sent to [facility] but has made another appointment for [facility]. Patient state she has been having abdominal pressure and back pain. Has a white discharge but not leaking or bleeding. Has felt fetal movement.

## 2013-01-24 NOTE — MAU Note (Signed)
Pt states she has been having back and abdominal pain for about 1 month. Pt states she was told it was"Round Ligament Pain"

## 2013-01-25 ENCOUNTER — Other Ambulatory Visit: Payer: Self-pay | Admitting: Advanced Practice Midwife

## 2013-01-25 LAB — GC/CHLAMYDIA PROBE AMP: GC Probe RNA: NEGATIVE

## 2013-01-25 MED ORDER — PROGESTERONE MICRONIZED 200 MG PO CAPS: 200.0000 mg | ORAL_CAPSULE | Freq: Every day | ORAL | Status: DC

## 2013-02-06 ENCOUNTER — Other Ambulatory Visit: Payer: Self-pay | Admitting: Women's Health

## 2013-02-06 ENCOUNTER — Encounter: Payer: Self-pay | Admitting: Women's Health

## 2013-02-06 ENCOUNTER — Ambulatory Visit (INDEPENDENT_AMBULATORY_CARE_PROVIDER_SITE_OTHER): Payer: Medicaid Other | Admitting: Women's Health

## 2013-02-06 ENCOUNTER — Ambulatory Visit (INDEPENDENT_AMBULATORY_CARE_PROVIDER_SITE_OTHER): Payer: Medicaid Other

## 2013-02-06 VITALS — BP 108/60 | Wt 134.5 lb

## 2013-02-06 DIAGNOSIS — O26872 Cervical shortening, second trimester: Secondary | ICD-10-CM

## 2013-02-06 DIAGNOSIS — Z01818 Encounter for other preprocedural examination: Secondary | ICD-10-CM

## 2013-02-06 DIAGNOSIS — O26879 Cervical shortening, unspecified trimester: Secondary | ICD-10-CM

## 2013-02-06 DIAGNOSIS — Z3482 Encounter for supervision of other normal pregnancy, second trimester: Secondary | ICD-10-CM

## 2013-02-06 DIAGNOSIS — Z331 Pregnant state, incidental: Secondary | ICD-10-CM

## 2013-02-06 DIAGNOSIS — R7689 Other specified abnormal immunological findings in serum: Secondary | ICD-10-CM

## 2013-02-06 DIAGNOSIS — Z23 Encounter for immunization: Secondary | ICD-10-CM

## 2013-02-06 DIAGNOSIS — Z1389 Encounter for screening for other disorder: Secondary | ICD-10-CM

## 2013-02-06 DIAGNOSIS — F1920 Other psychoactive substance dependence, uncomplicated: Secondary | ICD-10-CM

## 2013-02-06 DIAGNOSIS — O360990 Maternal care for other rhesus isoimmunization, unspecified trimester, not applicable or unspecified: Secondary | ICD-10-CM

## 2013-02-06 DIAGNOSIS — R768 Other specified abnormal immunological findings in serum: Secondary | ICD-10-CM

## 2013-02-06 DIAGNOSIS — O09299 Supervision of pregnancy with other poor reproductive or obstetric history, unspecified trimester: Secondary | ICD-10-CM

## 2013-02-06 LAB — POCT URINALYSIS DIPSTICK
Blood, UA: NEGATIVE
Glucose, UA: NEGATIVE
Nitrite, UA: NEGATIVE

## 2013-02-06 LAB — CBC WITH DIFFERENTIAL/PLATELET
Eosinophils Absolute: 0.1 10*3/uL (ref 0.0–0.7)
Hemoglobin: 10.9 g/dL — ABNORMAL LOW (ref 12.0–15.0)
Lymphocytes Relative: 27 % (ref 12–46)
Lymphs Abs: 2.5 10*3/uL (ref 0.7–4.0)
MCH: 26.1 pg (ref 26.0–34.0)
Monocytes Relative: 5 % (ref 3–12)
Neutro Abs: 6.2 10*3/uL (ref 1.7–7.7)
Neutrophils Relative %: 66 % (ref 43–77)
Platelets: 393 10*3/uL (ref 150–400)
RBC: 4.18 MIL/uL (ref 3.87–5.11)
WBC: 9.4 10*3/uL (ref 4.0–10.5)

## 2013-02-06 MED ORDER — ACYCLOVIR 400 MG PO TABS: 400.0000 mg | ORAL_TABLET | Freq: Two times a day (BID) | ORAL | Status: DC

## 2013-02-06 MED ORDER — INFLUENZA VAC SPLIT QUAD 0.5 ML IM SUSP
0.5000 mL | Freq: Once | INTRAMUSCULAR | Status: AC
  Administered 2013-02-06: 0.5 mL via INTRAMUSCULAR

## 2013-02-06 NOTE — Progress Notes (Signed)
Reports good fm. Denies uc's, lof, vb, urinary frequency, urgency, hesitancy, or dysuria.  No complaints. Went to MAU day after last visit w/ c/o abd pain, had repeat CL which had improved to 2.8cm, was placed on prometrium 200mg  pv q hs, has been taking as rx'd.  Reviewed today's CL of 2.4cm, down to 0.5cm w/ funneling w/ fundal pressure w/ JVF who came in and discussed recommendation, r/b of cerclage. Cerclage scheduled for Wed 10/15 @ 1500, to arrive at 1330, npo after midnight. CBC today. Reviewed ptl s/s, fm.  All questions answered. ,To continue prometrium. F/U in 1wk for f/u w/ JVF.

## 2013-02-06 NOTE — Progress Notes (Signed)
Cx length U/S @21 +5wks- vtx active fetus, ant gr 0 plac, CX=2.4cm closed, no funneling noted although with fundal pressure applied cx=0.5cm with funneling noted(1.46cm in width and 2.08cm in length), FHR-165bpm

## 2013-02-06 NOTE — Patient Instructions (Signed)
Cerclage of the Cervix Cerclage of the cervix is a surgical procedure for an incompetent cervix. An incompetent cervix is a weak cervix that opens up before labor begins. Cerclage of the cervix sews the cervix closed during pregnancy.  LET YOUR CAREGIVER KNOW ABOUT:   Allergies to foods or medications.  All over-the-counter, prescription, herbal, eye drops and cream medications you are using.  Taking illegal drugs or drinking an excessive amount of alcohol.  Any recent colds or infections.  Past problems with anesthetics or novocaine.  Past surgery.  History of blood clots or abnormal bleeding problems.  Other medical or health problems. RISKS AND COMPLICATIONS   Infection.  Bleeding.  Rupturing the amniotic sac (membranes).  Going into early labor and delivery.  Problems with the anesthesia.  Infection of the amniotic sac. BEFORE THE PROCEDURE   Do not take aspirin.  Do not eat or drink anything 8 hours before the procedure.  Do not smoke.  If you are being admitted the same day as the procedure, arrive at the hospital at least 60 minutes before the surgery or as directed. During this time, you will sign the necessary forms and get prepared for the surgery.  A waiting area is available for family and friends. PROCEDURE   You will be given an IV (intravenous) and medication to relax you.  You will be put to sleep with a general anesthetic.  A stitch will be placed in and around the cervix to tighten it and keep it closed. AFTER THE PROCEDURE   You will go to a recovery room where you and the baby are monitored.  Once you are awake, stable, and taking fluids well, barring other problems, you will be allowed to return to your room.  You will usually stay in the hospital overnight.  You may get an injection of progesterone to prevent uterine contractions.  Have someone drive you home and stay with you for a day or two.  You may be given medications to take  when you go home. HOME CARE INSTRUCTIONS   Only take over-the-counter or prescriptions medicines for pain, discomfort or fever as directed by your caregiver.  Avoid physical activities and exercise until your caregiver says it is okay.  Resume your usual diet.  Do not douche.  Do not have sexual intercourse until your caregiver tells you it is OK.  Keep your follow up surgical and prenatal appointments with your caregiver. SEEK MEDICAL CARE IF:   You have abnormal vaginal discharge.  You develop a rash.  You are having problems with your medications.  You become lightheaded or feel faint. SEEK IMMEDIATE MEDICAL CARE IF:   You develop vaginal bleeding.  You are leaking fluid or have a gush of fluid from the vagina.  You develop a temperature of 102 F (38.9 C) or higher.  You pass out.  You have uterine contractions.  You feel the baby is not moving as much as usual or cannot feel the baby move. Document Released: 03/26/2008 Document Revised: 07/06/2011 Document Reviewed: 03/26/2008 [facility] Patient Information 2014 [place], [place].

## 2013-02-06 NOTE — Addendum Note (Signed)
Addended by: Cheral Marker on: 02/06/2013 02:39 PM   Modules accepted: Orders

## 2013-02-07 ENCOUNTER — Encounter (HOSPITAL_COMMUNITY): Payer: Self-pay

## 2013-02-08 ENCOUNTER — Encounter (HOSPITAL_COMMUNITY): Payer: Medicaid Other | Admitting: Anesthesiology

## 2013-02-08 ENCOUNTER — Other Ambulatory Visit: Payer: Self-pay | Admitting: Obstetrics and Gynecology

## 2013-02-08 ENCOUNTER — Encounter (HOSPITAL_COMMUNITY)
Admission: RE | Disposition: A | Discharge status: Home / Self Care | Payer: Self-pay | Source: Ambulatory Visit | Attending: Obstetrics & Gynecology

## 2013-02-08 ENCOUNTER — Ambulatory Visit (HOSPITAL_COMMUNITY)
Admission: RE | Admit: 2013-02-08 | Discharge: 2013-02-08 | Disposition: A | Discharge status: Home / Self Care | Payer: Medicaid Other | Source: Ambulatory Visit | Attending: Obstetrics & Gynecology | Admitting: Obstetrics & Gynecology

## 2013-02-08 ENCOUNTER — Ambulatory Visit (HOSPITAL_COMMUNITY): Payer: Medicaid Other | Admitting: Anesthesiology

## 2013-02-08 DIAGNOSIS — O360121 Maternal care for anti-D [Rh] antibodies, second trimester, fetus 1: Secondary | ICD-10-CM

## 2013-02-08 DIAGNOSIS — O3430 Maternal care for cervical incompetence, unspecified trimester: Secondary | ICD-10-CM | POA: Insufficient documentation

## 2013-02-08 DIAGNOSIS — Z3482 Encounter for supervision of other normal pregnancy, second trimester: Secondary | ICD-10-CM

## 2013-02-08 DIAGNOSIS — O26879 Cervical shortening, unspecified trimester: Secondary | ICD-10-CM | POA: Insufficient documentation

## 2013-02-08 DIAGNOSIS — O26872 Cervical shortening, second trimester: Secondary | ICD-10-CM

## 2013-02-08 HISTORY — PX: CERVICAL CERCLAGE: SHX1329

## 2013-02-08 LAB — URINALYSIS, ROUTINE W REFLEX MICROSCOPIC
Bilirubin Urine: NEGATIVE
Hgb urine dipstick: NEGATIVE
Nitrite: NEGATIVE
Specific Gravity, Urine: 1.015 (ref 1.005–1.030)
pH: 7.5 (ref 5.0–8.0)

## 2013-02-08 SURGERY — CERCLAGE, CERVIX, VAGINAL APPROACH
Anesthesia: Spinal | Site: Cervix | Wound class: Clean Contaminated

## 2013-02-08 MED ORDER — LACTATED RINGERS IV SOLN
INTRAVENOUS | Status: DC
  Administered 2013-02-08: 14:00:00 via INTRAVENOUS

## 2013-02-08 MED ORDER — FENTANYL CITRATE 0.05 MG/ML IJ SOLN: 25.0000 ug | INTRAMUSCULAR | Status: DC | PRN

## 2013-02-08 MED ORDER — CEFAZOLIN SODIUM-DEXTROSE 2-3 GM-% IV SOLR
INTRAVENOUS | Status: AC
  Filled 2013-02-08: qty 50

## 2013-02-08 MED ORDER — LIDOCAINE IN DEXTROSE 5-7.5 % IV SOLN
INTRAVENOUS | Status: AC
  Filled 2013-02-08: qty 2

## 2013-02-08 MED ORDER — CEFAZOLIN SODIUM-DEXTROSE 2-3 GM-% IV SOLR
2.0000 g | INTRAVENOUS | Status: AC
  Administered 2013-02-08: 2 g via INTRAVENOUS

## 2013-02-08 MED ORDER — PHENYLEPHRINE HCL 10 MG/ML IJ SOLN
INTRAMUSCULAR | Status: DC | PRN
  Administered 2013-02-08: 80 ug via INTRAVENOUS
  Administered 2013-02-08: 40 ug via INTRAVENOUS
  Administered 2013-02-08: 80 ug via INTRAVENOUS

## 2013-02-08 MED ORDER — PHENYLEPHRINE 40 MCG/ML (10ML) SYRINGE FOR IV PUSH (FOR BLOOD PRESSURE SUPPORT)
PREFILLED_SYRINGE | INTRAVENOUS | Status: AC
  Filled 2013-02-08: qty 5

## 2013-02-08 MED ORDER — FENTANYL CITRATE 0.05 MG/ML IJ SOLN
INTRAMUSCULAR | Status: AC
  Filled 2013-02-08: qty 2

## 2013-02-08 MED ORDER — FENTANYL CITRATE 0.05 MG/ML IJ SOLN
INTRAMUSCULAR | Status: DC | PRN
  Administered 2013-02-08 (×2): 50 ug via INTRAVENOUS

## 2013-02-08 SURGICAL SUPPLY — 21 items
CATH ROBINSON RED A/P 16FR (CATHETERS) IMPLANT
CLOTH BEACON ORANGE TIMEOUT ST (SAFETY) ×2 IMPLANT
COUNTER NEEDLE 1200 MAGNETIC (NEEDLE) IMPLANT
GLOVE BIO SURGEON ST LM GN SZ9 (GLOVE) ×2 IMPLANT
GLOVE BIO SURGEON STRL SZ7 (GLOVE) ×2 IMPLANT
GLOVE BIOGEL PI IND STRL 9 (GLOVE) ×2 IMPLANT
GLOVE BIOGEL PI INDICATOR 9 (GLOVE) ×2
GOWN PREVENTION PLUS XLARGE (GOWN DISPOSABLE) ×2 IMPLANT
GOWN STRL REIN 3XL LVL4 (GOWN DISPOSABLE) ×2 IMPLANT
GOWN STRL REIN XL XLG (GOWN DISPOSABLE) ×4 IMPLANT
NEEDLE MAYO .5 CIRCLE (NEEDLE) IMPLANT
PACK VAGINAL MINOR WOMEN LF (CUSTOM PROCEDURE TRAY) ×2 IMPLANT
PAD OB MATERNITY 4.3X12.25 (PERSONAL CARE ITEMS) ×2 IMPLANT
PAD PREP 24X48 CUFFED NSTRL (MISCELLANEOUS) ×2 IMPLANT
SUT MERSILENE 5MM BP 1 12 (SUTURE) IMPLANT
SUT PROLENE 1 CT 1 30 (SUTURE) IMPLANT
SUT SILK 2 0 SH (SUTURE) IMPLANT
TOWEL OR 17X24 6PK STRL BLUE (TOWEL DISPOSABLE) ×4 IMPLANT
TUBING NON-CON 1/4 X 20 CONN (TUBING) IMPLANT
WATER STERILE IRR 1000ML POUR (IV SOLUTION) ×2 IMPLANT
YANKAUER SUCT BULB TIP NO VENT (SUCTIONS) IMPLANT

## 2013-02-08 NOTE — Anesthesia Procedure Notes (Signed)
Spinal  Patient location during procedure: OR Preanesthetic Checklist Completed: patient identified, site marked, surgical consent, pre-op evaluation, timeout performed, IV checked, risks and benefits discussed and monitors and equipment checked Spinal Block Patient position: sitting Prep: DuraPrep Patient monitoring: heart rate, cardiac monitor, continuous pulse ox and blood pressure Approach: midline Location: L3-4 Injection technique: single-shot Needle Needle type: Sprotte  Needle gauge: 24 G Needle length: 9 cm Assessment Sensory level: T4 Additional Notes Spinal Dosage in OR  Xyloicaine ml       5%  1.0cc

## 2013-02-08 NOTE — Brief Op Note (Signed)
02/08/2013  4:02 PM  PATIENT:  Ruth Mullen  27 y.o. 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  POST-OPERATIVE DIAGNOSIS:  INCOMPETENT CERVIX, pregnancy 22 wk  PROCEDURE:  Procedure(s): CERCLAGE CERVICAL MCDONALD (N/A)  SURGEON:  Surgeon(s) and Role:    * Tilda Burrow, MD - Primary  PHYSICIAN ASSISTANT:   ASSISTANTS: beck, MD   ANESTHESIA:   spinal  EBL:  Total I/O In: 300 [I.V.:300] Out: -   BLOOD ADMINISTERED:none  DRAINS: none   LOCAL MEDICATIONS USED:  NONE  SPECIMEN:  No Specimen  DISPOSITION OF SPECIMEN:  N/A  COUNTS:  YES  TOURNIQUET:  * No tourniquets in log *  DICTATION: .Dragon Dictation  PLAN OF CARE: Discharge to home after PACU  PATIENT DISPOSITION:  PACU - hemodynamically stable.   Delay start of Pharmacological VTE agent (>24hrs) due to surgical blood loss or risk of bleeding: not applicable

## 2013-02-08 NOTE — Op Note (Signed)
02/08/2013  4:02 PM  PATIENT:  Ruth Mullen  27 y.o. 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  POST-OPERATIVE DIAGNOSIS:  INCOMPETENT CERVIX, pregnancy 22 wk  PROCEDURE:  Procedure(s): CERCLAGE CERVICAL MCDONALD (N/A)  SURGEON:  Surgeon(s) and Role:    * Tilda Burrow, MD - Primary Details of procedure: Patient was taken operating room, timeout conducted after spinal anesthesia introduced and adequate analgesia confirmed. Speculum was inserted,, and the cervix visualized. Cervical mucus is in place there were no visible membranes. The cervical vaginal junction was clearly defined. A 0 Prolene suture was then placed beginning in a clockwise fashion beginning at 11:00 and sewing very superficially just beneath the epithelium at the cervicovaginal junction, encircling the entire cervix in 5 or 6 bites. Suture was tied down cautiously such that ring forceps would not pass through the cervix without meeting some slight resistance. Multiple knots were applied and the and the suture trimmed with 2 cm length for future access. Patient tolerated procedure well with no bleeding and will be sent to recovery room for recovery

## 2013-02-08 NOTE — Anesthesia Postprocedure Evaluation (Signed)
  Anesthesia Post-op Note  Anesthesia Post Note  Patient: Ruth Mullen  Procedure(s) Performed: Procedure(s) (LRB): CERCLAGE CERVICAL MCDONALD (N/A)  Anesthesia type: Spinal  Patient location: PACU  Post pain: Pain level controlled  Post assessment: Post-op Vital signs reviewed  Last Vitals:  Filed Vitals:   02/08/13 1730  BP: 108/46  Pulse: 94  Temp:   Resp: 20    Post vital signs: Reviewed  Level of consciousness: awake  Complications: No apparent anesthesia complications

## 2013-02-08 NOTE — H&P (Signed)
Ruth Mullen is a 27 y.o. female presenting for McDonald Cerclage placement. She is a [id number] found to have a short cervix on ultrasound, placed on Prometrium,, then on followup u/s has shown a 2.4 cm long cervix with funnelling of the cervix with fundal pressure reducing the undilated portion of the cervix to 0.5 cm. The patient has been counselled regarding cerclage, with both potential benefits of reduced premature birth risk as well as limitations and risks of procedure, including the inherent risks of membrane rupture, injury to bladder or ureters, bleeding or infection. The patient accepts the recommended cerclage, with questions encouraged and answered.  The patient and her mother request the procedure be done at [facility] hospital.   The patient is B negative, with a positive antibody titer <2, presumed due to Prior Rhophylac administration.  History OB History   Grav Para Term Preterm Abortions TAB SAB Ect Mult Living   4 1 1  2 1 1   1      Past Medical History  Diagnosis Date  . No pertinent past medical history   . 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 Chlamydia    Past Surgical History  Procedure Laterality Date  . Dilation and curettage of uterus    . Cholecystectomy     Family History: family history is not on file. Social History:  reports that she quit smoking about 4 months ago. Her smoking use included Cigarettes. She has a .25 pack-year smoking history. She has never used smokeless tobacco. She reports that she does not drink alcohol or use illicit drugs.   Prenatal Transfer Tool  Maternal Diabetes: No Genetic Screening: Normal Maternal Ultrasounds/Referrals: Normal Fetal Ultrasounds or other Referrals:  Other:  short cervix with funnellling Maternal Substance Abuse:  No Significant Maternal Medications:  Prometrium 200 mg PV at HS. Significant Maternal Lab Results:  None Other Comments:  None  ROS Using Prometrium. Denies cramping or bleeding. Placenta  anterior, no previa or low lying placenta    Last menstrual period 09/07/2012. Exam Physical Exam  Constitutional: She is oriented to person, place, and time. She appears well-developed and well-nourished.  HENT:  Head: Normocephalic.  Eyes: Pupils are equal, round, and reactive to light.  Neck: No thyromegaly present.  Cardiovascular: Normal rate.   Respiratory: Effort normal.  GI: Soft.  Gravid uterus c/w dates  Genitourinary: Vagina normal.  See u/s report.  Musculoskeletal: Normal range of motion.  Neurological: She is alert and oriented to person, place, and time.  Psychiatric: She has a normal mood and affect. Her behavior is normal. Thought content normal.    Prenatal labs: ABO, Rh: --/--/B NEG, B NEG (06/30 2145) Antibody: POS (07/08 1115) Rubella: 3.50 (07/08 1115) RPR: NON REAC (07/08 1115)  HBsAg: NEGATIVE (07/08 1115)  HIV: NON REACTIVE (07/08 1115)  GBS:     Assessment/Plan: Pregnancy 22 wks Short cervix with funneling, for McDonald cerclage Wednesday, 02/08/13 3 pm.WHOG   Ruth Mullen 02/08/2013, 6:10 AM

## 2013-02-08 NOTE — Transfer of Care (Signed)
Immediate Anesthesia Transfer of Care Note  Patient: Ruth Mullen  Procedure(s) Performed: Procedure(s): CERCLAGE CERVICAL MCDONALD (N/A)  Patient Location: PACU  Anesthesia Type:Spinal  Level of Consciousness: awake, alert  and oriented  Airway & Oxygen Therapy: Patient Spontanous Breathing  Post-op Assessment: Report given to PACU RN and Post -op Vital signs reviewed and stable  Post vital signs: Reviewed and stable  Complications: No apparent anesthesia complications

## 2013-02-08 NOTE — Anesthesia Preprocedure Evaluation (Signed)

## 2013-02-09 ENCOUNTER — Telehealth: Payer: Self-pay | Admitting: *Deleted

## 2013-02-09 ENCOUNTER — Encounter: Payer: Medicaid Other | Admitting: Obstetrics and Gynecology

## 2013-02-09 ENCOUNTER — Ambulatory Visit (INDEPENDENT_AMBULATORY_CARE_PROVIDER_SITE_OTHER): Payer: Medicaid Other | Admitting: Obstetrics and Gynecology

## 2013-02-09 ENCOUNTER — Encounter (HOSPITAL_COMMUNITY): Payer: Self-pay | Admitting: Obstetrics and Gynecology

## 2013-02-09 VITALS — BP 122/80 | Wt 136.0 lb

## 2013-02-09 DIAGNOSIS — O26879 Cervical shortening, unspecified trimester: Secondary | ICD-10-CM

## 2013-02-09 DIAGNOSIS — Z1389 Encounter for screening for other disorder: Secondary | ICD-10-CM

## 2013-02-09 DIAGNOSIS — O360990 Maternal care for other rhesus isoimmunization, unspecified trimester, not applicable or unspecified: Secondary | ICD-10-CM

## 2013-02-09 DIAGNOSIS — F1920 Other psychoactive substance dependence, uncomplicated: Secondary | ICD-10-CM

## 2013-02-09 DIAGNOSIS — O3430 Maternal care for cervical incompetence, unspecified trimester: Secondary | ICD-10-CM

## 2013-02-09 DIAGNOSIS — Z331 Pregnant state, incidental: Secondary | ICD-10-CM

## 2013-02-09 DIAGNOSIS — O3432 Maternal care for cervical incompetence, second trimester: Secondary | ICD-10-CM

## 2013-02-09 LAB — POCT URINALYSIS DIPSTICK
Blood, UA: NEGATIVE
Ketones, UA: NEGATIVE
Leukocytes, UA: NEGATIVE

## 2013-02-09 NOTE — Progress Notes (Signed)
Pt here today for a vaginal discharge, pt thinks she lost her mucous plug. Pt stated she passed a snotty clump of stuff that had a brownish tent to it. PT denies any bleeding or gush of fluid. Pt denies any cramping as well.

## 2013-02-09 NOTE — Progress Notes (Signed)
Exam: cervix looks great, no cyanosis, no bleeding , no stretching of lower ut segment. Pt with some back ache still , seemingly at site of spinal. No gush of fluid nocontrations. A: normal exam 24 hr s/p cerclage. F/u 2 wk

## 2013-02-09 NOTE — Telephone Encounter (Signed)
Spoke with pt. Had cerclage yesterday. Pt wondered if she still needed to take Progesterone. Spoke with Dr. Emelda Fear, he advised to continue Progesterone. Pt not having any cramping. No spotting, but did pass a nickel size gray plug. Pt states she hasn't felt the baby move. I advised that pt's usually don't start feeling baby move until about 20 weeks. Pt states she has been feeling the baby move. Advised to come in today at 1:30 pm to be worked in. Pt called back stating the baby was very active now. Reviewed with Dr. Emelda Fear. Pt ok not to come in now. JSY

## 2013-02-14 ENCOUNTER — Encounter: Payer: Medicaid Other | Admitting: Obstetrics and Gynecology

## 2013-02-22 ENCOUNTER — Encounter: Payer: Self-pay | Admitting: Advanced Practice Midwife

## 2013-02-22 ENCOUNTER — Ambulatory Visit (INDEPENDENT_AMBULATORY_CARE_PROVIDER_SITE_OTHER): Payer: Medicaid Other | Admitting: Advanced Practice Midwife

## 2013-02-22 VITALS — BP 118/70 | Wt 137.5 lb

## 2013-02-22 DIAGNOSIS — Z1389 Encounter for screening for other disorder: Secondary | ICD-10-CM

## 2013-02-22 DIAGNOSIS — O26872 Cervical shortening, second trimester: Secondary | ICD-10-CM

## 2013-02-22 DIAGNOSIS — F1920 Other psychoactive substance dependence, uncomplicated: Secondary | ICD-10-CM

## 2013-02-22 DIAGNOSIS — O26879 Cervical shortening, unspecified trimester: Secondary | ICD-10-CM

## 2013-02-22 DIAGNOSIS — O360990 Maternal care for other rhesus isoimmunization, unspecified trimester, not applicable or unspecified: Secondary | ICD-10-CM

## 2013-02-22 DIAGNOSIS — O99019 Anemia complicating pregnancy, unspecified trimester: Secondary | ICD-10-CM

## 2013-02-22 DIAGNOSIS — Z331 Pregnant state, incidental: Secondary | ICD-10-CM

## 2013-02-22 DIAGNOSIS — O09299 Supervision of pregnancy with other poor reproductive or obstetric history, unspecified trimester: Secondary | ICD-10-CM

## 2013-02-22 LAB — POCT URINALYSIS DIPSTICK
Glucose, UA: NEGATIVE
Leukocytes, UA: NEGATIVE
Nitrite, UA: NEGATIVE

## 2013-02-22 NOTE — Progress Notes (Signed)
Denies VB, LOF, or contractions. Feel good. Highly encouraged to continue taking Prometrium. Recommended using A&D ointment to create a barrier to prevent irritation. No problems or concerns at this time. All questions answered. FU next week for cervical length.

## 2013-03-01 ENCOUNTER — Encounter: Payer: Self-pay | Admitting: Advanced Practice Midwife

## 2013-03-01 ENCOUNTER — Other Ambulatory Visit: Payer: Self-pay | Admitting: Advanced Practice Midwife

## 2013-03-01 ENCOUNTER — Ambulatory Visit (INDEPENDENT_AMBULATORY_CARE_PROVIDER_SITE_OTHER): Payer: Medicaid Other | Admitting: Advanced Practice Midwife

## 2013-03-01 ENCOUNTER — Ambulatory Visit (INDEPENDENT_AMBULATORY_CARE_PROVIDER_SITE_OTHER): Payer: Medicaid Other

## 2013-03-01 ENCOUNTER — Other Ambulatory Visit: Payer: Self-pay | Admitting: Obstetrics & Gynecology

## 2013-03-01 VITALS — BP 128/70 | Wt 140.0 lb

## 2013-03-01 DIAGNOSIS — Z331 Pregnant state, incidental: Secondary | ICD-10-CM

## 2013-03-01 DIAGNOSIS — O3430 Maternal care for cervical incompetence, unspecified trimester: Secondary | ICD-10-CM

## 2013-03-01 DIAGNOSIS — O26879 Cervical shortening, unspecified trimester: Secondary | ICD-10-CM

## 2013-03-01 DIAGNOSIS — O26872 Cervical shortening, second trimester: Secondary | ICD-10-CM

## 2013-03-01 DIAGNOSIS — O3432 Maternal care for cervical incompetence, second trimester: Secondary | ICD-10-CM

## 2013-03-01 DIAGNOSIS — Z1389 Encounter for screening for other disorder: Secondary | ICD-10-CM

## 2013-03-01 LAB — POCT URINALYSIS DIPSTICK
Blood, UA: NEGATIVE
Glucose, UA: NEGATIVE
Leukocytes, UA: NEGATIVE
Nitrite, UA: NEGATIVE

## 2013-03-01 NOTE — Progress Notes (Signed)
U/S(25+0wks)-breech active fetus, approp growth EFW 1 lb 11 oz (50th%tile), fluid wnl, FHR-159 bpm, anterior Gr 0 placenta, vaginal u/s performed to measure cx, cx = 1.9cm closed, cerclage noted and with fundal pressure applied cx=1.6cm

## 2013-03-01 NOTE — Patient Instructions (Signed)
1. Before your test, do not eat or drink anything for 8-10 hours prior to your  appointment (a small amount of water is allowed and you may take any medicines you normally take). 2. When you arrive, your blood will be drawn for a 'fasting' blood sugar level.  Then you will be given a sweetened carbonated beverage to drink. You should  complete drinking this beverage within five minutes. After finishing the  beverage, you will have your blood drawn exactly 1 and 2 hours later. Having  your blood drawn on time is an important part of this test. A total of three blood  samples will be done. 3. The test takes approximately 2  hours. During the test, do not have anything to  eat or drink. Do not smoke, chew gum (not even sugarless gum) or use breath mints.  4. During the test you should remain close by and seated as much as possible and  avoid walking around. You may want to bring a book or something else to  occupy your time.  5. After your test, you may eat and drink as normal. You may want to bring a snack  to eat after the test is finished. Your provider will advise you as to the results of  this test and any follow-up if necessary  

## 2013-03-01 NOTE — Progress Notes (Signed)
cx length down to 1.9cm, but only 1.6cm (vs .5) with pressure.  A&D helps with vaginal irritation from PG!  No contractions or complaints.  F/U 2 weeks for PN2/LROB

## 2013-03-02 ENCOUNTER — Encounter: Payer: Self-pay | Admitting: Advanced Practice Midwife

## 2013-03-02 ENCOUNTER — Telehealth: Payer: Self-pay | Admitting: *Deleted

## 2013-03-02 NOTE — Telephone Encounter (Signed)
Pt thought on yesterday's [place] that the cord looked like it was wrapped around the baby's neck. Tasha reviewed the [place] and the cord was not wrapped around the neck. Everything looked fine with the cord. Pt aware and had no further questions. JSY

## 2013-03-16 ENCOUNTER — Other Ambulatory Visit: Payer: Medicaid Other

## 2013-03-16 ENCOUNTER — Ambulatory Visit (INDEPENDENT_AMBULATORY_CARE_PROVIDER_SITE_OTHER): Payer: Medicaid Other | Admitting: Advanced Practice Midwife

## 2013-03-16 ENCOUNTER — Encounter: Payer: Self-pay | Admitting: Women's Health

## 2013-03-16 ENCOUNTER — Encounter: Payer: Self-pay | Admitting: Advanced Practice Midwife

## 2013-03-16 VITALS — BP 130/70 | Wt 143.5 lb

## 2013-03-16 DIAGNOSIS — O26879 Cervical shortening, unspecified trimester: Secondary | ICD-10-CM

## 2013-03-16 DIAGNOSIS — Z1389 Encounter for screening for other disorder: Secondary | ICD-10-CM

## 2013-03-16 DIAGNOSIS — O0993 Supervision of high risk pregnancy, unspecified, third trimester: Secondary | ICD-10-CM

## 2013-03-16 DIAGNOSIS — B009 Herpesviral infection, unspecified: Secondary | ICD-10-CM

## 2013-03-16 DIAGNOSIS — O3430 Maternal care for cervical incompetence, unspecified trimester: Secondary | ICD-10-CM

## 2013-03-16 DIAGNOSIS — Z331 Pregnant state, incidental: Secondary | ICD-10-CM

## 2013-03-16 DIAGNOSIS — Z3482 Encounter for supervision of other normal pregnancy, second trimester: Secondary | ICD-10-CM

## 2013-03-16 DIAGNOSIS — Z3483 Encounter for supervision of other normal pregnancy, third trimester: Secondary | ICD-10-CM

## 2013-03-16 LAB — POCT URINALYSIS DIPSTICK
Blood, UA: NEGATIVE
Ketones, UA: NEGATIVE
Leukocytes, UA: NEGATIVE
Nitrite, UA: NEGATIVE
Protein, UA: NEGATIVE

## 2013-03-16 LAB — CBC
HCT: 31.6 % — ABNORMAL LOW (ref 36.0–46.0)
MCH: 26.7 pg (ref 26.0–34.0)
MCV: 78.2 fL (ref 78.0–100.0)
Platelets: 369 10*3/uL (ref 150–400)
RBC: 4.04 MIL/uL (ref 3.87–5.11)
WBC: 8.7 10*3/uL (ref 4.0–10.5)

## 2013-03-16 NOTE — Progress Notes (Signed)
Still taking the Prometrium with no problems. Answered questions regarding cerclage removal. How fast will labor start afterwards? Informed it may start shortly after or not at all and need labor induced. Patient and family verbalized understanding. Denies vaginal bleeding, leaking of fluid and contractions.   F/u in 3 weeks

## 2013-03-17 LAB — HIV ANTIBODY (ROUTINE TESTING W REFLEX): HIV: NONREACTIVE

## 2013-03-17 LAB — HSV 2 ANTIBODY, IGG: HSV 2 Glycoprotein G Ab, IgG: 3.62 IV — ABNORMAL HIGH

## 2013-03-17 LAB — GLUCOSE TOLERANCE, 2 HOURS W/ 1HR
Glucose, 1 hour: 146 mg/dL (ref 70–170)
Glucose, 2 hour: 121 mg/dL (ref 70–139)
Glucose, Fasting: 79 mg/dL (ref 70–99)

## 2013-03-17 LAB — RPR

## 2013-03-17 NOTE — Telephone Encounter (Signed)
LMOM to return call. AMT

## 2013-03-21 ENCOUNTER — Ambulatory Visit (INDEPENDENT_AMBULATORY_CARE_PROVIDER_SITE_OTHER): Payer: Medicaid Other | Admitting: Adult Health

## 2013-03-21 ENCOUNTER — Encounter: Payer: Self-pay | Admitting: Adult Health

## 2013-03-21 VITALS — BP 110/60 | Ht 60.0 in | Wt 143.0 lb

## 2013-03-21 DIAGNOSIS — Z3482 Encounter for supervision of other normal pregnancy, second trimester: Secondary | ICD-10-CM

## 2013-03-21 DIAGNOSIS — O360121 Maternal care for anti-D [Rh] antibodies, second trimester, fetus 1: Secondary | ICD-10-CM

## 2013-03-21 DIAGNOSIS — Z1389 Encounter for screening for other disorder: Secondary | ICD-10-CM

## 2013-03-21 DIAGNOSIS — Z331 Pregnant state, incidental: Secondary | ICD-10-CM

## 2013-03-21 DIAGNOSIS — O360921 Maternal care for other rhesus isoimmunization, second trimester, fetus 1: Secondary | ICD-10-CM

## 2013-03-21 DIAGNOSIS — O26872 Cervical shortening, second trimester: Secondary | ICD-10-CM

## 2013-03-21 DIAGNOSIS — O360990 Maternal care for other rhesus isoimmunization, unspecified trimester, not applicable or unspecified: Secondary | ICD-10-CM

## 2013-03-21 LAB — POCT URINALYSIS DIPSTICK
Blood, UA: NEGATIVE
Glucose, UA: NEGATIVE
Ketones, UA: NEGATIVE
Nitrite, UA: NEGATIVE

## 2013-03-21 MED ORDER — RHO D IMMUNE GLOBULIN 1500 UNIT/2ML IJ SOLN
300.0000 ug | Freq: Once | INTRAMUSCULAR | Status: AC
  Administered 2013-03-21: 300 ug via INTRAMUSCULAR

## 2013-03-21 NOTE — Progress Notes (Signed)
Patient ID: Ruth Mullen, female   DOB: 1985-06-23, 27 y.o.   MRN: [id number] PT here for rhogam injection. Pt states she has good fetal movement, and denies any swelling or bleeding.

## 2013-03-22 DIAGNOSIS — B009 Herpesviral infection, unspecified: Secondary | ICD-10-CM | POA: Insufficient documentation

## 2013-03-23 ENCOUNTER — Encounter: Payer: Self-pay | Admitting: Women's Health

## 2013-03-27 ENCOUNTER — Telehealth: Payer: Self-pay | Admitting: *Deleted

## 2013-03-27 DIAGNOSIS — Z3482 Encounter for supervision of other normal pregnancy, second trimester: Secondary | ICD-10-CM

## 2013-03-27 DIAGNOSIS — B009 Herpesviral infection, unspecified: Secondary | ICD-10-CM

## 2013-03-27 DIAGNOSIS — O26872 Cervical shortening, second trimester: Secondary | ICD-10-CM

## 2013-03-27 DIAGNOSIS — O360121 Maternal care for anti-D [Rh] antibodies, second trimester, fetus 1: Secondary | ICD-10-CM

## 2013-03-27 NOTE — Telephone Encounter (Signed)
Called and spoke with pt about above message. Pt stated that she thought that the pressure she was having was coming from over doing it, cooking and the holidays. PT also advised to get a belly band. Pt was advised to call next time she has any issues, that we also have an after hours nurse line. Pt verbalized understanding.

## 2013-04-04 ENCOUNTER — Ambulatory Visit (INDEPENDENT_AMBULATORY_CARE_PROVIDER_SITE_OTHER): Payer: Medicaid Other | Admitting: Women's Health

## 2013-04-04 ENCOUNTER — Encounter: Payer: Self-pay | Admitting: Women's Health

## 2013-04-04 VITALS — BP 120/58 | Wt 146.5 lb

## 2013-04-04 DIAGNOSIS — Z1389 Encounter for screening for other disorder: Secondary | ICD-10-CM

## 2013-04-04 DIAGNOSIS — N898 Other specified noninflammatory disorders of vagina: Secondary | ICD-10-CM

## 2013-04-04 DIAGNOSIS — O3430 Maternal care for cervical incompetence, unspecified trimester: Secondary | ICD-10-CM

## 2013-04-04 DIAGNOSIS — O26879 Cervical shortening, unspecified trimester: Secondary | ICD-10-CM

## 2013-04-04 DIAGNOSIS — O0993 Supervision of high risk pregnancy, unspecified, third trimester: Secondary | ICD-10-CM

## 2013-04-04 DIAGNOSIS — O99340 Other mental disorders complicating pregnancy, unspecified trimester: Secondary | ICD-10-CM

## 2013-04-04 DIAGNOSIS — O360990 Maternal care for other rhesus isoimmunization, unspecified trimester, not applicable or unspecified: Secondary | ICD-10-CM

## 2013-04-04 DIAGNOSIS — Z331 Pregnant state, incidental: Secondary | ICD-10-CM

## 2013-04-04 DIAGNOSIS — O2390 Unspecified genitourinary tract infection in pregnancy, unspecified trimester: Secondary | ICD-10-CM

## 2013-04-04 DIAGNOSIS — O98519 Other viral diseases complicating pregnancy, unspecified trimester: Secondary | ICD-10-CM

## 2013-04-04 DIAGNOSIS — O360131 Maternal care for anti-D [Rh] antibodies, third trimester, fetus 1: Secondary | ICD-10-CM

## 2013-04-04 LAB — POCT WET PREP (WET MOUNT): Clue Cells Wet Prep Whiff POC: NEGATIVE

## 2013-04-04 LAB — POCT URINALYSIS DIPSTICK
Blood, UA: NEGATIVE
Glucose, UA: NEGATIVE
Ketones, UA: NEGATIVE
Leukocytes, UA: NEGATIVE
Nitrite, UA: NEGATIVE
Protein, UA: NEGATIVE

## 2013-04-04 NOTE — Progress Notes (Signed)
Reports good fm. Denies regular uc's, lof, vb, urinary frequency, urgency, hesitancy, or dysuria.  Occ BH, no more than 8/day. Some pressure. Has cerclage d/t cervical incompetence/short cx. Denies abnormal/malodorous d/c or vulvovaginal itching/irritation. Spec exam: cx visually closed, large amount thick clumpy white nonodorous d/c. Wet prep neg. D/C probably from prometrium. SVE: closed w/ cerclage intact.  Difficulty sleeping at night- reviewed relief measures to try. To let us know if they don't help. Reviewed ptl s/s, to push po fluids/water- stay well hydrated, empty bladder frequently, lie on sides, rest, continue pelvic rest. To go to whog if BHs increase, lof, vb, decreased fm, any concern that something may be happening. Discussed always better to be on safe side and get checked out if anything changes.  All questions answered. F/U in 1wk for visit.

## 2013-04-04 NOTE — Patient Instructions (Signed)
Preterm Labor Information °Preterm labor is when labor starts at less than 37 weeks of pregnancy. The normal length of a pregnancy is 39 to 41 weeks. °CAUSES °Often, there is no identifiable underlying cause as to why a woman goes into preterm labor. One of the most common known causes of preterm labor is infection. Infections of the uterus, cervix, vagina, amniotic sac, bladder, kidney, or even the lungs (pneumonia) can cause labor to start. Other suspected causes of preterm labor include:  °· Urogenital infections, such as yeast infections and bacterial vaginosis.   °· Uterine abnormalities (uterine shape, uterine septum, fibroids, or bleeding from the placenta).   °· A cervix that has been operated on (it may fail to stay closed).   °· Malformations in the fetus.   °· Multiple gestations (twins, triplets, and so on).   °· Breakage of the amniotic sac.   °RISK FACTORS °· Having a previous history of preterm labor.   °· Having premature rupture of membranes (PROM).   °· Having a placenta that covers the opening of the cervix (placenta previa).   °· Having a placenta that separates from the uterus (placental abruption).   °· Having a cervix that is too weak to hold the fetus in the uterus (incompetent cervix).   °· Having too much fluid in the amniotic sac (polyhydramnios).   °· Taking illegal drugs or smoking while pregnant.   °· Not gaining enough weight while pregnant.   °· Being younger than 18 and older than 25 years old.   °· Having a low socioeconomic status.   °· Being African American. °SYMPTOMS °Signs and symptoms of preterm labor include:  °· Menstrual-like cramps, abdominal pain, or back pain. °· Uterine contractions that are regular, as frequent as six in an hour, regardless of their intensity (may be mild or painful). °· Contractions that start on the top of the uterus and spread down to the lower abdomen and back.   °· A sense of increased pelvic pressure.   °· A watery or bloody mucus discharge that  comes from the vagina.   °TREATMENT °Depending on the length of the pregnancy and other circumstances, your health care provider may suggest bed rest. If necessary, there are medicines that can be given to stop contractions and to mature the fetal lungs. If labor happens before 34 weeks of pregnancy, a prolonged hospital stay may be recommended. Treatment depends on the condition of both you and the fetus.  °WHAT SHOULD YOU DO IF YOU THINK YOU ARE IN PRETERM LABOR? °Call your health care provider right away. You will need to go to the hospital to get checked immediately. °HOW CAN YOU PREVENT PRETERM LABOR IN FUTURE PREGNANCIES? °You should:  °· Stop smoking if you smoke.  °· Maintain healthy weight gain and avoid chemicals and drugs that are not necessary. °· Be watchful for any type of infection. °· Inform your health care provider if you have a known history of preterm labor. °Document Released: 07/04/2003 Document Revised: 12/14/2012 Document Reviewed: 05/16/2012 °ExitCare® Patient Information ©2014 [employer]. ° °Third Trimester of Pregnancy °The third trimester is from week 29 through week 42, months 7 through 9. The third trimester is a time when the fetus is growing rapidly. At the end of the ninth month, the fetus is about 20 inches in length and weighs 6 10 pounds.  °BODY CHANGES °Your body goes through many changes during pregnancy. The changes vary from woman to woman.  °· Your weight will continue to increase. You can expect to gain 25 35 pounds (11 16 kg) by the end of the pregnancy. °·   You may begin to get stretch marks on your hips, abdomen, and breasts. °· You may urinate more often because the fetus is moving lower into your pelvis and pressing on your bladder. °· You may develop or continue to have heartburn as a result of your pregnancy. °· You may develop constipation because certain hormones are causing the muscles that push waste through your intestines to slow down. °· You may develop  hemorrhoids or swollen, bulging veins (varicose veins). °· You may have pelvic pain because of the weight gain and pregnancy hormones relaxing your joints between the bones in your pelvis. Back aches may result from over exertion of the muscles supporting your posture. °· Your breasts will continue to grow and be tender. A yellow discharge may leak from your breasts called colostrum. °· Your belly button may stick out. °· You may feel short of breath because of your expanding uterus. °· You may notice the fetus "dropping," or moving lower in your abdomen. °· You may have a bloody mucus discharge. This usually occurs a few days to a week before labor begins. °· Your cervix becomes thin and soft (effaced) near your due date. °WHAT TO EXPECT AT YOUR PRENATAL EXAMS  °You will have prenatal exams every 2 weeks until week 36. Then, you will have weekly prenatal exams. During a routine prenatal visit: °· You will be weighed to make sure you and the fetus are growing normally. °· Your blood pressure is taken. °· Your abdomen will be measured to track your baby's growth. °· The fetal heartbeat will be listened to. °· Any test results from the previous visit will be discussed. °· You may have a cervical check near your due date to see if you have effaced. °At around 36 weeks, your caregiver will check your cervix. At the same time, your caregiver will also perform a test on the secretions of the vaginal tissue. This test is to determine if a type of bacteria, Group B streptococcus, is present. Your caregiver will explain this further. °Your caregiver may ask you: °· What your birth plan is. °· How you are feeling. °· If you are feeling the baby move. °· If you have had any abnormal symptoms, such as leaking fluid, bleeding, severe headaches, or abdominal cramping. °· If you have any questions. °Other tests or screenings that may be performed during your third trimester include: °· Blood tests that check for low iron levels  (anemia). °· Fetal testing to check the health, activity level, and growth of the fetus. Testing is done if you have certain medical conditions or if there are problems during the pregnancy. °FALSE LABOR °You may feel small, irregular contractions that eventually go away. These are called Braxton Hicks contractions, or false labor. Contractions may last for hours, days, or even weeks before true labor sets in. If contractions come at regular intervals, intensify, or become painful, it is best to be seen by your caregiver.  °SIGNS OF LABOR  °· Menstrual-like cramps. °· Contractions that are 5 minutes apart or less. °· Contractions that start on the top of the uterus and spread down to the lower abdomen and back. °· A sense of increased pelvic pressure or back pain. °· A watery or bloody mucus discharge that comes from the vagina. °If you have any of these signs before the 37th week of pregnancy, call your caregiver right away. You need to go to the hospital to get checked immediately. °HOME CARE INSTRUCTIONS  °· Avoid all   smoking, herbs, alcohol, and unprescribed drugs. These chemicals affect the formation and growth of the baby. °· Follow your caregiver's instructions regarding medicine use. There are medicines that are either safe or unsafe to take during pregnancy. °· Exercise only as directed by your caregiver. Experiencing uterine cramps is a good sign to stop exercising. °· Continue to eat regular, healthy meals. °· Wear a good support bra for breast tenderness. °· Do not use hot tubs, steam rooms, or saunas. °· Wear your seat belt at all times when driving. °· Avoid raw meat, uncooked cheese, cat litter boxes, and soil used by cats. These carry germs that can cause birth defects in the baby. °· Take your prenatal vitamins. °· Try taking a stool softener (if your caregiver approves) if you develop constipation. Eat more high-fiber foods, such as fresh vegetables or fruit and whole grains. Drink plenty of fluids  to keep your urine clear or pale yellow. °· Take warm sitz baths to soothe any pain or discomfort caused by hemorrhoids. Use hemorrhoid cream if your caregiver approves. °· If you develop varicose veins, wear support hose. Elevate your feet for 15 minutes, 3 4 times a day. Limit salt in your diet. °· Avoid heavy lifting, wear low heal shoes, and practice good posture. °· Rest a lot with your legs elevated if you have leg cramps or low back pain. °· Visit your dentist if you have not gone during your pregnancy. Use a soft toothbrush to brush your teeth and be gentle when you floss. °· A sexual relationship may be continued unless your caregiver directs you otherwise. °· Do not travel far distances unless it is absolutely necessary and only with the approval of your caregiver. °· Take prenatal classes to understand, practice, and ask questions about the labor and delivery. °· Make a trial run to the hospital. °· Pack your hospital bag. °· Prepare the baby's nursery. °· Continue to go to all your prenatal visits as directed by your caregiver. °SEEK MEDICAL CARE IF: °· You are unsure if you are in labor or if your water has broken. °· You have dizziness. °· You have mild pelvic cramps, pelvic pressure, or nagging pain in your abdominal area. °· You have persistent nausea, vomiting, or diarrhea. °· You have a bad smelling vaginal discharge. °· You have pain with urination. °SEEK IMMEDIATE MEDICAL CARE IF:  °· You have a fever. °· You are leaking fluid from your vagina. °· You have spotting or bleeding from your vagina. °· You have severe abdominal cramping or pain. °· You have rapid weight loss or gain. °· You have shortness of breath with chest pain. °· You notice sudden or extreme swelling of your face, hands, ankles, feet, or legs. °· You have not felt your baby move in over an hour. °· You have severe headaches that do not go away with medicine. °· You have vision changes. °Document Released: 04/07/2001 Document  Revised: 12/14/2012 Document Reviewed: 06/14/2012 °ExitCare® Patient Information ©2014 [employer]. ° °

## 2013-04-04 NOTE — Telephone Encounter (Signed)
Pts questions answered.  

## 2013-04-12 ENCOUNTER — Ambulatory Visit (INDEPENDENT_AMBULATORY_CARE_PROVIDER_SITE_OTHER): Payer: Medicaid Other | Admitting: Advanced Practice Midwife

## 2013-04-12 ENCOUNTER — Encounter: Payer: Self-pay | Admitting: Advanced Practice Midwife

## 2013-04-12 VITALS — BP 130/60 | Wt 146.0 lb

## 2013-04-12 DIAGNOSIS — O3430 Maternal care for cervical incompetence, unspecified trimester: Secondary | ICD-10-CM

## 2013-04-12 DIAGNOSIS — O99340 Other mental disorders complicating pregnancy, unspecified trimester: Secondary | ICD-10-CM

## 2013-04-12 DIAGNOSIS — O26879 Cervical shortening, unspecified trimester: Secondary | ICD-10-CM

## 2013-04-12 DIAGNOSIS — O99019 Anemia complicating pregnancy, unspecified trimester: Secondary | ICD-10-CM

## 2013-04-12 DIAGNOSIS — Z331 Pregnant state, incidental: Secondary | ICD-10-CM

## 2013-04-12 DIAGNOSIS — O360990 Maternal care for other rhesus isoimmunization, unspecified trimester, not applicable or unspecified: Secondary | ICD-10-CM

## 2013-04-12 DIAGNOSIS — O09299 Supervision of pregnancy with other poor reproductive or obstetric history, unspecified trimester: Secondary | ICD-10-CM

## 2013-04-12 DIAGNOSIS — Z1389 Encounter for screening for other disorder: Secondary | ICD-10-CM

## 2013-04-12 DIAGNOSIS — O98519 Other viral diseases complicating pregnancy, unspecified trimester: Secondary | ICD-10-CM

## 2013-04-12 DIAGNOSIS — F1290 Cannabis use, unspecified, uncomplicated: Secondary | ICD-10-CM

## 2013-04-12 LAB — POCT URINALYSIS DIPSTICK
Blood, UA: NEGATIVE
Glucose, UA: NEGATIVE
Ketones, UA: NEGATIVE
Leukocytes, UA: NEGATIVE
Nitrite, UA: NEGATIVE
Protein, UA: NEGATIVE

## 2013-04-12 NOTE — Progress Notes (Signed)
No c/o at this time. Has occ BH, no different than usual. Tylenol PM makes her drowsy, but too uncomfortable to sleep.  Try to get creative with pillows, warm shower/bath.   Routine questions about pregnancy answered.  F/U in 2 weeks for LROB.

## 2013-04-13 LAB — DRUG SCREEN, URINE, NO CONFIRMATION
Amphetamine Screen, Ur: NEGATIVE
Barbiturate Quant, Ur: NEGATIVE
Cocaine Metabolites: NEGATIVE
Creatinine,U: 133 mg/dL
Marijuana Metabolite: NEGATIVE
Methadone: NEGATIVE
Opiate Screen, Urine: NEGATIVE
Phencyclidine (PCP): NEGATIVE

## 2013-04-25 ENCOUNTER — Encounter: Payer: Self-pay | Admitting: Advanced Practice Midwife

## 2013-04-25 ENCOUNTER — Ambulatory Visit (INDEPENDENT_AMBULATORY_CARE_PROVIDER_SITE_OTHER): Payer: Medicaid Other | Admitting: Advanced Practice Midwife

## 2013-04-25 VITALS — BP 118/62 | Wt 150.0 lb

## 2013-04-25 DIAGNOSIS — O3430 Maternal care for cervical incompetence, unspecified trimester: Secondary | ICD-10-CM

## 2013-04-25 DIAGNOSIS — O99340 Other mental disorders complicating pregnancy, unspecified trimester: Secondary | ICD-10-CM

## 2013-04-25 DIAGNOSIS — O98519 Other viral diseases complicating pregnancy, unspecified trimester: Secondary | ICD-10-CM

## 2013-04-25 DIAGNOSIS — O26879 Cervical shortening, unspecified trimester: Secondary | ICD-10-CM

## 2013-04-25 DIAGNOSIS — O09299 Supervision of pregnancy with other poor reproductive or obstetric history, unspecified trimester: Secondary | ICD-10-CM

## 2013-04-25 DIAGNOSIS — Z331 Pregnant state, incidental: Secondary | ICD-10-CM

## 2013-04-25 DIAGNOSIS — Z1389 Encounter for screening for other disorder: Secondary | ICD-10-CM

## 2013-04-25 DIAGNOSIS — O99019 Anemia complicating pregnancy, unspecified trimester: Secondary | ICD-10-CM

## 2013-04-25 DIAGNOSIS — O360990 Maternal care for other rhesus isoimmunization, unspecified trimester, not applicable or unspecified: Secondary | ICD-10-CM

## 2013-04-25 LAB — POCT URINALYSIS DIPSTICK
Blood, UA: NEGATIVE
Ketones, UA: NEGATIVE
Leukocytes, UA: NEGATIVE
Protein, UA: NEGATIVE

## 2013-04-25 NOTE — Progress Notes (Signed)
No c/o at this time.  Routine questions about pregnancy answered.  F/U in 2 weeks for LROB.    

## 2013-04-27 NOTE — L&D Delivery Note (Signed)
Delivery Note At 6:40 PM a viable and healthy female was delivered via Vaginal, Spontaneous Delivery (Presentation: Left Occiput Anterior).  APGAR: 9, 9; weight TBD.   Placenta status: Intact, Spontaneous.  Cord: 3 vessels with the following complications: None.  Cord pH: n/a  Anesthesia: Epidural  Episiotomy: None Lacerations: 1st degree Suture Repair: 3.0 monocryl Est. Blood Loss (mL): 350  Normal vaginal delivery over perineum with small 1st degree laceration. Vigorous infant with immediate spontaneous cry. 3rd stage managed with pit and traction. Placenta delivered intact within 10 minutes. 3 vessel cord. Placenta with diffuse calcifications but intact. Perineal laceration with slow bleeding, hemostasis achieved with single figure-of-8 stitch. Running subcuticular added for cosmesis to bring perineal epithelium together.   Mom to postpartum.  Baby to Couplet care / Skin to Skin. Placenta to birthing suites.  Beverely Lowdamo, Elena 06/07/2013, 7:22 PM  I was present for and supervised the delivery of this newborn. I agree with above.   Liliahna Cudd, Redmond BasemanKELI L, MD

## 2013-05-01 ENCOUNTER — Encounter (HOSPITAL_COMMUNITY): Payer: Self-pay | Admitting: *Deleted

## 2013-05-01 ENCOUNTER — Inpatient Hospital Stay (HOSPITAL_COMMUNITY)
Admission: AD | Admit: 2013-05-01 | Discharge: 2013-05-01 | Disposition: A | Discharge status: Home / Self Care | Payer: Medicaid Other | Source: Ambulatory Visit | Attending: Obstetrics & Gynecology | Admitting: Obstetrics & Gynecology

## 2013-05-01 DIAGNOSIS — O26879 Cervical shortening, unspecified trimester: Secondary | ICD-10-CM | POA: Insufficient documentation

## 2013-05-01 DIAGNOSIS — O3433 Maternal care for cervical incompetence, third trimester: Secondary | ICD-10-CM

## 2013-05-01 DIAGNOSIS — O3430 Maternal care for cervical incompetence, unspecified trimester: Secondary | ICD-10-CM | POA: Insufficient documentation

## 2013-05-01 DIAGNOSIS — O4700 False labor before 37 completed weeks of gestation, unspecified trimester: Secondary | ICD-10-CM | POA: Insufficient documentation

## 2013-05-01 DIAGNOSIS — Z87891 Personal history of nicotine dependence: Secondary | ICD-10-CM | POA: Insufficient documentation

## 2013-05-01 LAB — URINALYSIS, ROUTINE W REFLEX MICROSCOPIC
Bilirubin Urine: NEGATIVE
Glucose, UA: NEGATIVE mg/dL
HGB URINE DIPSTICK: NEGATIVE
KETONES UR: NEGATIVE mg/dL
Leukocytes, UA: NEGATIVE
Nitrite: NEGATIVE
PROTEIN: NEGATIVE mg/dL
Specific Gravity, Urine: 1.01 (ref 1.005–1.030)
UROBILINOGEN UA: 0.2 mg/dL (ref 0.0–1.0)
pH: 7 (ref 5.0–8.0)

## 2013-05-01 MED ORDER — BETAMETHASONE SOD PHOS & ACET 6 (3-3) MG/ML IJ SUSP
12.0000 mg | Freq: Once | INTRAMUSCULAR | Status: AC
  Administered 2013-05-01: 12 mg via INTRAMUSCULAR
  Filled 2013-05-01: qty 2

## 2013-05-01 MED ORDER — NIFEDIPINE 10 MG PO CAPS
20.0000 mg | ORAL_CAPSULE | Freq: Once | ORAL | Status: AC
  Administered 2013-05-01: 20 mg via ORAL
  Filled 2013-05-01: qty 2

## 2013-05-01 NOTE — Discharge Instructions (Signed)
Cervical Insufficiency  Cervical insufficiency is when the cervix is weak and starts to open (dilate) and thin (efface) before the pregnancy is at term and without labor starting. This is also called incompetent cervix. It can happen in the second or third trimester when the fetus starts putting pressure on the cervix. Cervical insufficiency can lead to a miscarriage, preterm premature rupture of the membranes (PPROM), or having the baby early (preterm birth).  RISK FACTORS You may be more likely to develop cervical insufficiency if:  You have a shorter cervix than normal.  Damage or injury occurred to your cervix from a past pregnancy or surgery.  You were born with a cervical defect.  You have had procedure done on the cervix, such as cervical biopsy.  You have a history of cervical insufficiency.  You have a history of PPROM.  You have ended several past pregnancies through abortion.  You were exposed to the drug diethylstilbestrol (DES). SYMPTOMS Often times, women do not have any symptoms. Other times, woman may only have mild symptoms that often start between week 14 through 20. The symptoms may last several days or weeks. These symptoms include:  Light spotting or bleeding from the vagina.  Pelvic pressure.  A change in vaginal discharge, such as discharge that changes from clear, white, or light yellow to pink or tan.  Back pain.  Abdominal pain or cramping. DIAGNOSIS Cervical insufficiency cannot be diagnosed before you become pregnant. Once you are pregnant, your caregiver will ask about your medical history and if you have had any problems in past pregnancies. Tell your caregiver about any procedures performed on your cervix or if you have a history of miscarriages or cervical insufficiency. If your caregiver thinks you are at high risk for cervical insufficiency or show signs of cervical insufficiency, he or she may:  Perform a pelvic exam. This will check for:  The  presence of the membranes (amniotic sac) coming out of the cervix.  Cervical abnormalities.  Cervical injuries.  The presence of contractions.  Perform an ultrasonography (commonly called ultrasound) to measure the length and thickness of the cervix. TREATMENT If you have been diagnosed with cervical insufficiency, your caregiver may recommend:  Limiting physical activity.  Bed rest at home or in the hospital.  Pelvic rest, which means no sexual intercourse or placing anything in the vagina.  Cerclage to sew the cervix closed and prevent it from opening too early. The stitches (sutures) are removed between weeks 36 and 38 to avoid problems during labor. Cerclage may be recommended during pregnancy if you have had a history of miscarriages or preterm births without a known cause. It may also be recommended if you have a short cervix that was identified by ultrasound or if your caregiver has found that your cervix has dilated before 24 weeks of pregnancy. Limiting physical activity and bed rest may or may not help prevent a preterm birth. WHEN SHOULD YOU SEEK IMMEDIATE MEDICAL CARE?  Seek immediate medical care if you show any symptoms of cervical insufficiency. You will need to go to the hospital to get checked immediately. Document Released: 04/13/2005 Document Revised: 12/14/2012 Document Reviewed: 06/20/2012 [facility]Exit[facility] Patient Information 2014 [place]Care, [place].  Preterm Labor Information Preterm labor is when labor starts at less than 37 weeks of pregnancy. The normal length of a pregnancy is 39 to 41 weeks. CAUSES Often, there is no identifiable underlying cause as to why a woman goes into preterm labor. One of the most common known causes  of preterm labor is infection. Infections of the uterus, cervix, vagina, amniotic sac, bladder, kidney, or even the lungs (pneumonia) can cause labor to start. Other suspected causes of preterm labor include:   Urogenital infections, such as yeast  infections and bacterial vaginosis.   Uterine abnormalities (uterine shape, uterine septum, fibroids, or bleeding from the placenta).   A cervix that has been operated on (it may fail to stay closed).   Malformations in the fetus.   Multiple gestations (twins, triplets, and so on).   Breakage of the amniotic sac.  RISK FACTORS  Having a previous history of preterm labor.   Having premature rupture of membranes (PROM).   Having a placenta that covers the opening of the cervix (placenta previa).   Having a placenta that separates from the uterus (placental abruption).   Having a cervix that is too weak to hold the fetus in the uterus (incompetent cervix).   Having too much fluid in the amniotic sac (polyhydramnios).   Taking illegal drugs or smoking while pregnant.   Not gaining enough weight while pregnant.   Being younger than 6118 and older than 26 years old.   Having a low socioeconomic status.   Being African American. SYMPTOMS Signs and symptoms of preterm labor include:   Menstrual-like cramps, abdominal pain, or back pain.  Uterine contractions that are regular, as frequent as six in an hour, regardless of their intensity (may be mild or painful).  Contractions that start on the top of the uterus and spread down to the lower abdomen and back.   A sense of increased pelvic pressure.   A watery or bloody mucus discharge that comes from the vagina.  TREATMENT Depending on the length of the pregnancy and other circumstances, your health care provider may suggest bed rest. If necessary, there are medicines that can be given to stop contractions and to mature the fetal lungs. If labor happens before 34 weeks of pregnancy, a prolonged hospital stay may be recommended. Treatment depends on the condition of both you and the fetus.  WHAT SHOULD YOU DO IF YOU THINK YOU ARE IN PRETERM LABOR? Call your health care provider right away. You will need to go to  the hospital to get checked immediately. HOW CAN YOU PREVENT PRETERM LABOR IN FUTURE PREGNANCIES? You should:   Stop smoking if you smoke.  Maintain healthy weight gain and avoid chemicals and drugs that are not necessary.  Be watchful for any type of infection.  Inform your health care provider if you have a known history of preterm labor. Document Released: 07/04/2003 Document Revised: 12/14/2012 Document Reviewed: 05/16/2012 [facility]Exit[facility] Patient Information 2014 [place]ettExitCare, MarylandLLC.

## 2013-05-01 NOTE — MAU Note (Signed)
Patient states she is having contractions every 5 minutes. States she started having diarrhea and nausea yesterday. Had a gush of thick discharge, not watery. Denies leaking fluid or bleeding and reports good fetal movement.

## 2013-05-01 NOTE — MAU Provider Note (Signed)
  History     CSN: [id number]  Arrival date and time: 05/01/13 1248   First Provider Initiated Contact with Patient 05/01/13 1542      Chief Complaint  Patient presents with  . Labor Eval   HPI Ruth Mullen is a 28 y.o. [id number] at 4276w5d who presents to maternity admissions reporting increase in frequency and intensity of UCs since last night. WP at [facility] a few days ago neg; still having D/C. Denies leakage of fluid or vaginal bleeding. Good fetal movement. Patient reports history of prior preterm delivery at 36 weeks with induction for ?maternal exhaustion. Pt has cerclage and is being treated with prometrium.   Pregnancy Course: Care at [facility], rescue cerclage for short cx,; + marijuana on UDS  OB History   Grav Para Term Preterm Abortions TAB SAB Ect Mult Living   4 1 1  2 1 1   1       Past Medical History  Diagnosis Date  . No pertinent past medical history   . 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 Chlamydia     Past Surgical History  Procedure Laterality Date  . Dilation and curettage of uterus    . Cholecystectomy    . Cervical cerclage N/A 02/08/2013    Procedure: CERCLAGE CERVICAL MCDONALD;  Surgeon: Tilda BurrowJohn V Ferguson, MD;  Location: [facility];  Service: Gynecology;  Laterality: N/A;    History reviewed. No pertinent family history.  History  Substance Use Topics  . Smoking status: Former Smoker -- 0.25 packs/day for 1 years    Types: Cigarettes    Quit date: 09/25/2012  . Smokeless tobacco: Never Used  . Alcohol Use: No    Allergies: No Known Allergies  Prescriptions prior to admission  Medication Sig Dispense Refill  . acetaminophen (TYLENOL) 325 MG tablet Take 650 mg by mouth every 6 (six) hours as needed for mild pain or moderate pain.      . Prenatal Vit-Fe Fumarate-FA (PRENATAL MULTIVITAMIN) TABS Take 1 tablet by mouth at bedtime.      . progesterone (PROMETRIUM) 200 MG capsule Place 200 mg vaginally at bedtime.        ROS Physical Exam   Blood pressure  107/69, pulse 99, temperature 98.3 F (36.8 C), temperature source Oral, resp. rate 16, height 5' 0.5" (1.537 m), weight 67.586 kg (149 lb), last menstrual period 09/07/2012, SpO2 100.00%.  Physical Exam Gen: AAF, WDWN, appears uncomfortable, NAD  Dilation: Closed (cerclage intact) Effacement (%): 60 Exam by:: D. Kobey Sides, CNM  MAU Course  Procedures  Assessment and Plan  [id number] presents at 33.5 with occasional contractions. Not in active labor, no pattern of regular contractions, no cervical dilation or bleeding  Will give betamethasone now and return to clinic tomorrow for second dose. Nifedipine prescribed to decrease contractions, continue prometrium.  Ruth Mullen, Ruth Mullen 05/01/2013, 4:01 PM   Evaluation and management procedures were performed by Resident physician under my supervision/collaboration. Chart reviewed, patient examined by me and I agree with management and plan. Abd soft. NT, S=D. EFM reactive without UCs noted. Pelvic rest. Consider fFN next visit if indicated. D?W Dr. Marice Potterove re: POC.

## 2013-05-01 NOTE — MAU Note (Signed)
Patient has had a cerclage since 22 weeks.

## 2013-05-01 NOTE — MAU Provider Note (Signed)
Chief Complaint:  Labor Eval   First Provider Initiated Contact with Patient 05/01/13 1542      HPI: Ruth Mullen is a 28 y.o. [phone] at [redacted]w[redacted]d who presents to maternity admissions reporting increase in frequency and intensity of UCs since last night. WP at [facility] a few days ago neg; still having D/C.  Denies leakage of fluid or vaginal bleeding. Good fetal movement.   Pregnancy Course: Care at [facility], rescue cerclage for short cx,; + marijuana on UDS  Past Medical History: Past Medical History  Diagnosis Date  . No pertinent past medical history   . 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 Chlamydia     Past obstetric history: OB History  Gravida Para Term Preterm AB SAB TAB Ectopic Multiple Living  4 1 1  2 1 1   1     # Outcome Date GA Lbr Len/2nd Weight Sex Delivery Anes PTL Lv  4 CUR           3 TRM 11/04/04 [redacted]w[redacted]d 12:00 2.863 kg (6 lb 5 oz) M SVD EPI N Y  2 SAB              Comments: D&C  1 TAB               Past Surgical History: Past Surgical History  Procedure Laterality Date  . Dilation and curettage of uterus    . Cholecystectomy    . Cervical cerclage N/A 02/08/2013    Procedure: CERCLAGE CERVICAL MCDONALD;  Surgeon: Tilda Burrow, MD;  Location: [facility];  Service: Gynecology;  Laterality: N/A;     Family History: History reviewed. No pertinent family history.  Social History: History  Substance Use Topics  . Smoking status: Former Smoker -- 0.25 packs/day for 1 years    Types: Cigarettes    Quit date: 09/25/2012  . Smokeless tobacco: Never Used  . Alcohol Use: No    Allergies: No Known Allergies  Meds:  Prescriptions prior to admission  Medication Sig Dispense Refill  . acetaminophen (TYLENOL) 325 MG tablet Take 650 mg by mouth every 6 (six) hours as needed for mild pain or moderate pain.      . Prenatal Vit-Fe Fumarate-FA (PRENATAL MULTIVITAMIN) TABS Take 1 tablet by mouth at bedtime.      . progesterone (PROMETRIUM) 200 MG capsule Place 200 mg  vaginally at bedtime.        ROS: Pertinent findings in history of present illness.  Physical Exam  Blood pressure 107/69, pulse 99, temperature 98.3 F (36.8 C), temperature source Oral, resp. rate 16, height 5' 0.5" (1.537 m), weight 67.586 kg (149 lb), last menstrual period 09/07/2012, SpO2 100.00%. GENERAL: Well-developed, well-nourished female in no acute distress.  HEENT: normocephalic HEART: normal rate RESP: normal effort ABDOMEN: Soft, non-tender, gravid appropriate for gestational age EXTREMITIES: Nontender, no edema NEURO: alert and oriented     FHT:  Baseline 145-150 , moderate variability, accelerations present, no decelerations Contractions:occ, mild   Labs: Results for orders placed during the hospital encounter of 05/01/13 (from the past 24 hour(s))  URINALYSIS, ROUTINE W REFLEX MICROSCOPIC     Status: None   Collection Time    05/01/13  1:20 PM      Result Value Range   Color, Urine YELLOW  YELLOW   APPearance CLEAR  CLEAR   Specific Gravity, Urine 1.010  1.005 - 1.030   pH 7.0  5.0 - 8.0   Glucose, UA NEGATIVE  NEGATIVE mg/dL   Hgb urine dipstick NEGATIVE  NEGATIVE   Bilirubin Urine NEGATIVE  NEGATIVE   Ketones, ur NEGATIVE  NEGATIVE mg/dL   Protein, ur NEGATIVE  NEGATIVE mg/dL   Urobilinogen, UA 0.2  0.0 - 1.0 mg/dL   Nitrite NEGATIVE  NEGATIVE   Leukocytes, UA NEGATIVE  NEGATIVE    Imaging:  No results found. MAU Course: Observed >2 hrs: after Procardia 20 mg po, abd pain much less and toco rare mild, so VE not repeated BMZ first dose given per C/W Dr. Marice Potterove  Assessment: 1. Cervical incompetence affecting management of pregnancy, antepartum, third trimester   2. Threatened preterm labor, antepartum, third trimester   G4P1021 @ 2964w5d  Plan: Discharge home Labor precautions and fetal kick counts    Medication List         acetaminophen 325 MG tablet  Commonly known as:  TYLENOL  Take 650 mg by mouth every 6 (six) hours as needed for mild  pain or moderate pain.     prenatal multivitamin Tabs tablet  Take 1 tablet by mouth at bedtime.     progesterone 200 MG capsule  Commonly known as:  PROMETRIUM  Place 200 mg vaginally at bedtime.        Follow-up Information   Follow up with [facility]. Schedule an appointment as soon as possible for a visit in 1 day. (For 2nd betamethasone shot)    Contact information:   [address] [place] [place] [phone]    Note sent to [facility] to get injection there tomorrow  Danae OrleansDeirdre C Modestine Scherzinger, CNM 05/01/2013 3:46 PM

## 2013-05-02 ENCOUNTER — Encounter: Payer: Self-pay | Admitting: Adult Health

## 2013-05-02 ENCOUNTER — Ambulatory Visit (INDEPENDENT_AMBULATORY_CARE_PROVIDER_SITE_OTHER): Payer: Medicaid Other | Admitting: Adult Health

## 2013-05-02 DIAGNOSIS — O0993 Supervision of high risk pregnancy, unspecified, third trimester: Secondary | ICD-10-CM

## 2013-05-02 DIAGNOSIS — O360131 Maternal care for anti-D [Rh] antibodies, third trimester, fetus 1: Secondary | ICD-10-CM

## 2013-05-02 DIAGNOSIS — O26873 Cervical shortening, third trimester: Secondary | ICD-10-CM

## 2013-05-02 DIAGNOSIS — O479 False labor, unspecified: Secondary | ICD-10-CM

## 2013-05-02 DIAGNOSIS — B009 Herpesviral infection, unspecified: Secondary | ICD-10-CM

## 2013-05-02 MED ORDER — BETAMETHASONE SOD PHOS & ACET 6 (3-3) MG/ML IJ SUSP
12.0000 mg | Freq: Once | INTRAMUSCULAR | Status: AC
  Administered 2013-05-02: 12 mg via INTRAMUSCULAR

## 2013-05-02 NOTE — Progress Notes (Signed)
Patient ID: Ruth Mullen, female   DOB: May 26, 1985, 26 y.o.   MRN: [id number] Pt states that things are better than yesterday, not as many contractions. Pt states she has good fetal movement and denies any bleeding.

## 2013-05-09 ENCOUNTER — Ambulatory Visit (INDEPENDENT_AMBULATORY_CARE_PROVIDER_SITE_OTHER): Payer: Medicaid Other | Admitting: Advanced Practice Midwife

## 2013-05-09 VITALS — BP 110/70 | Wt 148.5 lb

## 2013-05-09 DIAGNOSIS — O99340 Other mental disorders complicating pregnancy, unspecified trimester: Secondary | ICD-10-CM

## 2013-05-09 DIAGNOSIS — O98519 Other viral diseases complicating pregnancy, unspecified trimester: Secondary | ICD-10-CM

## 2013-05-09 DIAGNOSIS — O09299 Supervision of pregnancy with other poor reproductive or obstetric history, unspecified trimester: Secondary | ICD-10-CM

## 2013-05-09 DIAGNOSIS — O99019 Anemia complicating pregnancy, unspecified trimester: Secondary | ICD-10-CM

## 2013-05-09 DIAGNOSIS — O3430 Maternal care for cervical incompetence, unspecified trimester: Secondary | ICD-10-CM

## 2013-05-09 DIAGNOSIS — B009 Herpesviral infection, unspecified: Secondary | ICD-10-CM

## 2013-05-09 DIAGNOSIS — Z331 Pregnant state, incidental: Secondary | ICD-10-CM

## 2013-05-09 DIAGNOSIS — O26879 Cervical shortening, unspecified trimester: Secondary | ICD-10-CM

## 2013-05-09 DIAGNOSIS — O360990 Maternal care for other rhesus isoimmunization, unspecified trimester, not applicable or unspecified: Secondary | ICD-10-CM

## 2013-05-09 DIAGNOSIS — Z1389 Encounter for screening for other disorder: Secondary | ICD-10-CM

## 2013-05-09 LAB — POCT URINALYSIS DIPSTICK
Glucose, UA: NEGATIVE
KETONES UA: NEGATIVE
LEUKOCYTES UA: NEGATIVE
Nitrite, UA: NEGATIVE
Protein, UA: NEGATIVE
RBC UA: NEGATIVE

## 2013-05-09 MED ORDER — ACYCLOVIR 400 MG PO TABS: 400.0000 mg | ORAL_TABLET | Freq: Three times a day (TID) | ORAL | Status: DC

## 2013-05-09 NOTE — Progress Notes (Signed)
Having irregualr contractions, nothing major.  Will remove cerclage at 36 weeks per JVF.   No c/o at this time.  Routine questions about pregnancy answered.  F/U in 1 weeks for LROB.

## 2013-05-17 ENCOUNTER — Ambulatory Visit (INDEPENDENT_AMBULATORY_CARE_PROVIDER_SITE_OTHER): Payer: Medicaid Other | Admitting: Obstetrics and Gynecology

## 2013-05-17 ENCOUNTER — Encounter: Payer: Self-pay | Admitting: Obstetrics and Gynecology

## 2013-05-17 VITALS — BP 110/60 | Wt 152.0 lb

## 2013-05-17 DIAGNOSIS — O3430 Maternal care for cervical incompetence, unspecified trimester: Secondary | ICD-10-CM

## 2013-05-17 DIAGNOSIS — Z331 Pregnant state, incidental: Secondary | ICD-10-CM

## 2013-05-17 DIAGNOSIS — O26879 Cervical shortening, unspecified trimester: Secondary | ICD-10-CM

## 2013-05-17 DIAGNOSIS — O09299 Supervision of pregnancy with other poor reproductive or obstetric history, unspecified trimester: Secondary | ICD-10-CM

## 2013-05-17 DIAGNOSIS — O0990 Supervision of high risk pregnancy, unspecified, unspecified trimester: Secondary | ICD-10-CM

## 2013-05-17 DIAGNOSIS — O98519 Other viral diseases complicating pregnancy, unspecified trimester: Secondary | ICD-10-CM

## 2013-05-17 DIAGNOSIS — O99019 Anemia complicating pregnancy, unspecified trimester: Secondary | ICD-10-CM

## 2013-05-17 DIAGNOSIS — O360990 Maternal care for other rhesus isoimmunization, unspecified trimester, not applicable or unspecified: Secondary | ICD-10-CM

## 2013-05-17 DIAGNOSIS — O99340 Other mental disorders complicating pregnancy, unspecified trimester: Secondary | ICD-10-CM

## 2013-05-17 DIAGNOSIS — Z1389 Encounter for screening for other disorder: Secondary | ICD-10-CM

## 2013-05-17 LAB — POCT URINALYSIS DIPSTICK
Glucose, UA: NEGATIVE
KETONES UA: NEGATIVE
Leukocytes, UA: NEGATIVE
Nitrite, UA: NEGATIVE
Protein, UA: NEGATIVE
RBC UA: NEGATIVE

## 2013-05-17 NOTE — Progress Notes (Signed)
Pt states that she is having some pressure and regular contractions. Contractions were 10 minutes apart last night for a few hours and some mild contractions today.

## 2013-05-17 NOTE — Patient Instructions (Signed)
Fetal Movement Counts Patient Name: __________________________________________________ Patient Due Date: ____________________ Performing a fetal movement count is highly recommended in high-risk pregnancies, but it is good for every pregnant woman to do. Your caregiver may ask you to start counting fetal movements at 28 weeks of the pregnancy. Fetal movements often increase:  After eating a full meal.  After physical activity.  After eating or drinking something sweet or cold.  At rest. Pay attention to when you feel the baby is most active. This will help you notice a pattern of your baby's sleep and wake cycles and what factors contribute to an increase in fetal movement. It is important to perform a fetal movement count at the same time each day when your baby is normally most active.  HOW TO COUNT FETAL MOVEMENTS 1. Find a quiet and comfortable area to sit or lie down on your left side. Lying on your left side provides the best blood and oxygen circulation to your baby. 2. Write down the day and time on a sheet of paper or in a journal. 3. Start counting kicks, flutters, swishes, rolls, or jabs in a 2 hour period. You should feel at least 10 movements within 2 hours. 4. If you do not feel 10 movements in 2 hours, wait 2 3 hours and count again. Look for a change in the pattern or not enough counts in 2 hours. SEEK MEDICAL CARE IF:  You feel less than 10 counts in 2 hours, tried twice.  There is no movement in over an hour.  The pattern is changing or taking longer each day to reach 10 counts in 2 hours.  You feel the baby is not moving as he or she usually does. Date: ____________ Movements: ____________ Start time: ____________ Finish time: ____________  Date: ____________ Movements: ____________ Start time: ____________ Finish time: ____________ Date: ____________ Movements: ____________ Start time: ____________ Finish time: ____________ Date: ____________ Movements: ____________  Start time: ____________ Finish time: ____________ Date: ____________ Movements: ____________ Start time: ____________ Finish time: ____________ Date: ____________ Movements: ____________ Start time: ____________ Finish time: ____________ Date: ____________ Movements: ____________ Start time: ____________ Finish time: ____________ Date: ____________ Movements: ____________ Start time: ____________ Finish time: ____________  Date: ____________ Movements: ____________ Start time: ____________ Finish time: ____________ Date: ____________ Movements: ____________ Start time: ____________ Finish time: ____________ Date: ____________ Movements: ____________ Start time: ____________ Finish time: ____________ Date: ____________ Movements: ____________ Start time: ____________ Finish time: ____________ Date: ____________ Movements: ____________ Start time: ____________ Finish time: ____________ Date: ____________ Movements: ____________ Start time: ____________ Finish time: ____________ Date: ____________ Movements: ____________ Start time: ____________ Finish time: ____________  Date: ____________ Movements: ____________ Start time: ____________ Finish time: ____________ Date: ____________ Movements: ____________ Start time: ____________ Finish time: ____________ Date: ____________ Movements: ____________ Start time: ____________ Finish time: ____________ Date: ____________ Movements: ____________ Start time: ____________ Finish time: ____________ Date: ____________ Movements: ____________ Start time: ____________ Finish time: ____________ Date: ____________ Movements: ____________ Start time: ____________ Finish time: ____________ Date: ____________ Movements: ____________ Start time: ____________ Finish time: ____________  Date: ____________ Movements: ____________ Start time: ____________ Finish time: ____________ Date: ____________ Movements: ____________ Start time: ____________ Finish time:  ____________ Date: ____________ Movements: ____________ Start time: ____________ Finish time: ____________ Date: ____________ Movements: ____________ Start time: ____________ Finish time: ____________ Date: ____________ Movements: ____________ Start time: ____________ Finish time: ____________ Date: ____________ Movements: ____________ Start time: ____________ Finish time: ____________ Date: ____________ Movements: ____________ Start time: ____________ Finish time: ____________  Date: ____________ Movements: ____________ Start time: ____________ Finish   time: ____________ Date: ____________ Movements: ____________ Start time: ____________ Finish time: ____________ Date: ____________ Movements: ____________ Start time: ____________ Finish time: ____________ Date: ____________ Movements: ____________ Start time: ____________ Finish time: ____________ Date: ____________ Movements: ____________ Start time: ____________ Finish time: ____________ Date: ____________ Movements: ____________ Start time: ____________ Finish time: ____________ Date: ____________ Movements: ____________ Start time: ____________ Finish time: ____________  Date: ____________ Movements: ____________ Start time: ____________ Finish time: ____________ Date: ____________ Movements: ____________ Start time: ____________ Finish time: ____________ Date: ____________ Movements: ____________ Start time: ____________ Finish time: ____________ Date: ____________ Movements: ____________ Start time: ____________ Finish time: ____________ Date: ____________ Movements: ____________ Start time: ____________ Finish time: ____________ Date: ____________ Movements: ____________ Start time: ____________ Finish time: ____________ Date: ____________ Movements: ____________ Start time: ____________ Finish time: ____________  Date: ____________ Movements: ____________ Start time: ____________ Finish time: ____________ Date: ____________ Movements:  ____________ Start time: ____________ Finish time: ____________ Date: ____________ Movements: ____________ Start time: ____________ Finish time: ____________ Date: ____________ Movements: ____________ Start time: ____________ Finish time: ____________ Date: ____________ Movements: ____________ Start time: ____________ Finish time: ____________ Date: ____________ Movements: ____________ Start time: ____________ Finish time: ____________ Date: ____________ Movements: ____________ Start time: ____________ Finish time: ____________  Date: ____________ Movements: ____________ Start time: ____________ Finish time: ____________ Date: ____________ Movements: ____________ Start time: ____________ Finish time: ____________ Date: ____________ Movements: ____________ Start time: ____________ Finish time: ____________ Date: ____________ Movements: ____________ Start time: ____________ Finish time: ____________ Date: ____________ Movements: ____________ Start time: ____________ Finish time: ____________ Date: ____________ Movements: ____________ Start time: ____________ Finish time: ____________ Document Released: 05/13/2006 Document Revised: 03/30/2012 Document Reviewed: 02/08/2012 [employer] Patient Information 2014 [employer].  

## 2013-05-17 NOTE — Progress Notes (Signed)
This chart was scribed by Bennett Scrapehristina Taylor, Medical Scribe, for Dr. Christin BachJohn Sheyanne Munley on 05/17/13 at 12:11 PM. This chart was reviewed by Dr. Christin BachJohn Layani Foronda and is accurate.   Pt has a cerclage placed. 36w today. Reports having regular contractions. Good FM. No complaints. [id number]. Living child was 2 weeks early. Pt's questions answered to apparent satisfaction.   Cerclage removed with minimal discomfort. Mild amount of bleeding from pinched tissue. Cervix is 1 cm, long and posterior. Fetus is vertex. Chaperone present.

## 2013-05-19 ENCOUNTER — Encounter (HOSPITAL_COMMUNITY): Payer: Self-pay | Admitting: *Deleted

## 2013-05-19 ENCOUNTER — Inpatient Hospital Stay (HOSPITAL_COMMUNITY)
Admission: AD | Admit: 2013-05-19 | Discharge: 2013-05-19 | Disposition: A | Discharge status: Home / Self Care | Payer: Medicaid Other | Source: Ambulatory Visit | Attending: Obstetrics and Gynecology | Admitting: Obstetrics and Gynecology

## 2013-05-19 DIAGNOSIS — O26879 Cervical shortening, unspecified trimester: Secondary | ICD-10-CM

## 2013-05-19 DIAGNOSIS — B009 Herpesviral infection, unspecified: Secondary | ICD-10-CM

## 2013-05-19 DIAGNOSIS — O0990 Supervision of high risk pregnancy, unspecified, unspecified trimester: Secondary | ICD-10-CM

## 2013-05-19 DIAGNOSIS — Z6791 Unspecified blood type, Rh negative: Secondary | ICD-10-CM

## 2013-05-19 DIAGNOSIS — O4700 False labor before 37 completed weeks of gestation, unspecified trimester: Secondary | ICD-10-CM | POA: Insufficient documentation

## 2013-05-19 DIAGNOSIS — O26899 Other specified pregnancy related conditions, unspecified trimester: Secondary | ICD-10-CM

## 2013-05-19 NOTE — MAU Note (Signed)
Having a lot of ctx's  And feeling pressure.  Was 1 cm on Wed when cerclage was removed.

## 2013-05-24 ENCOUNTER — Ambulatory Visit (INDEPENDENT_AMBULATORY_CARE_PROVIDER_SITE_OTHER): Payer: Medicaid Other | Admitting: Obstetrics and Gynecology

## 2013-05-24 ENCOUNTER — Encounter: Payer: Self-pay | Admitting: Obstetrics and Gynecology

## 2013-05-24 VITALS — BP 118/80 | Wt 152.0 lb

## 2013-05-24 DIAGNOSIS — Z331 Pregnant state, incidental: Secondary | ICD-10-CM

## 2013-05-24 DIAGNOSIS — Z3480 Encounter for supervision of other normal pregnancy, unspecified trimester: Secondary | ICD-10-CM

## 2013-05-24 DIAGNOSIS — O3430 Maternal care for cervical incompetence, unspecified trimester: Secondary | ICD-10-CM

## 2013-05-24 DIAGNOSIS — O0990 Supervision of high risk pregnancy, unspecified, unspecified trimester: Secondary | ICD-10-CM

## 2013-05-24 DIAGNOSIS — O09299 Supervision of pregnancy with other poor reproductive or obstetric history, unspecified trimester: Secondary | ICD-10-CM

## 2013-05-24 DIAGNOSIS — O99340 Other mental disorders complicating pregnancy, unspecified trimester: Secondary | ICD-10-CM

## 2013-05-24 DIAGNOSIS — O98519 Other viral diseases complicating pregnancy, unspecified trimester: Secondary | ICD-10-CM

## 2013-05-24 DIAGNOSIS — Z1389 Encounter for screening for other disorder: Secondary | ICD-10-CM

## 2013-05-24 DIAGNOSIS — O99019 Anemia complicating pregnancy, unspecified trimester: Secondary | ICD-10-CM

## 2013-05-24 DIAGNOSIS — O360990 Maternal care for other rhesus isoimmunization, unspecified trimester, not applicable or unspecified: Secondary | ICD-10-CM

## 2013-05-24 DIAGNOSIS — O26879 Cervical shortening, unspecified trimester: Secondary | ICD-10-CM

## 2013-05-24 LAB — POCT URINALYSIS DIPSTICK
Blood, UA: NEGATIVE
GLUCOSE UA: NEGATIVE
Ketones, UA: NEGATIVE
LEUKOCYTES UA: NEGATIVE
NITRITE UA: NEGATIVE
Protein, UA: NEGATIVE

## 2013-05-24 LAB — OB RESULTS CONSOLE GC/CHLAMYDIA
Chlamydia: NEGATIVE
Gonorrhea: NEGATIVE

## 2013-05-24 NOTE — Progress Notes (Signed)
Pt is 37w. Good FM. + contraction episodes approximately 5 minutes apart. Mild brown spotting this morning. [facility] and was told that she was not dilated further. GBS and [facility] collected. Chaperone present. Pt tolerated exam without discomfort. Continue kick counts and rest.  This chart was scribed by Bennett Scrapehristina Taylor, Medical Scribe, for Dr. Christin BachJohn Aeon Koors on 05/24/13 at 12:10 PM. This chart was reviewed by Dr. Christin BachJohn Soul Hackman and is accurate.

## 2013-05-24 NOTE — Patient Instructions (Signed)
Continue Kick Counts

## 2013-05-24 NOTE — Progress Notes (Signed)
Pt states she is having pressure and irregular contractions. Pt denies any other problems or concerns at this time.

## 2013-05-25 LAB — GC/CHLAMYDIA PROBE AMP
CT Probe RNA: NEGATIVE
GC PROBE AMP APTIMA: NEGATIVE

## 2013-05-26 LAB — STREP B DNA PROBE: STREP GROUP B AG: NEGATIVE

## 2013-05-27 ENCOUNTER — Encounter (HOSPITAL_COMMUNITY): Payer: Self-pay

## 2013-05-27 ENCOUNTER — Inpatient Hospital Stay (HOSPITAL_COMMUNITY)
Admission: AD | Admit: 2013-05-27 | Discharge: 2013-05-28 | Disposition: A | Discharge status: Home / Self Care | Payer: Medicaid Other | Source: Ambulatory Visit | Attending: Obstetrics and Gynecology | Admitting: Obstetrics and Gynecology

## 2013-05-27 DIAGNOSIS — O479 False labor, unspecified: Secondary | ICD-10-CM | POA: Insufficient documentation

## 2013-05-27 NOTE — MAU Note (Signed)
Pt to [facility] with [facility] for further evaluation of labor due to pt volume in MAU. [facility]

## 2013-05-31 ENCOUNTER — Ambulatory Visit (INDEPENDENT_AMBULATORY_CARE_PROVIDER_SITE_OTHER): Payer: Medicaid Other | Admitting: Advanced Practice Midwife

## 2013-05-31 ENCOUNTER — Encounter: Payer: Self-pay | Admitting: Advanced Practice Midwife

## 2013-05-31 VITALS — BP 110/70 | Wt 156.0 lb

## 2013-05-31 DIAGNOSIS — O3430 Maternal care for cervical incompetence, unspecified trimester: Secondary | ICD-10-CM

## 2013-05-31 DIAGNOSIS — O09299 Supervision of pregnancy with other poor reproductive or obstetric history, unspecified trimester: Secondary | ICD-10-CM

## 2013-05-31 DIAGNOSIS — O99340 Other mental disorders complicating pregnancy, unspecified trimester: Secondary | ICD-10-CM

## 2013-05-31 DIAGNOSIS — O26879 Cervical shortening, unspecified trimester: Secondary | ICD-10-CM

## 2013-05-31 DIAGNOSIS — O360990 Maternal care for other rhesus isoimmunization, unspecified trimester, not applicable or unspecified: Secondary | ICD-10-CM

## 2013-05-31 DIAGNOSIS — Z331 Pregnant state, incidental: Secondary | ICD-10-CM

## 2013-05-31 DIAGNOSIS — O99019 Anemia complicating pregnancy, unspecified trimester: Secondary | ICD-10-CM

## 2013-05-31 DIAGNOSIS — O98519 Other viral diseases complicating pregnancy, unspecified trimester: Secondary | ICD-10-CM

## 2013-05-31 DIAGNOSIS — Z1389 Encounter for screening for other disorder: Secondary | ICD-10-CM

## 2013-05-31 LAB — POCT URINALYSIS DIPSTICK
Blood, UA: NEGATIVE
Glucose, UA: NEGATIVE
Ketones, UA: NEGATIVE
Leukocytes, UA: NEGATIVE
Nitrite, UA: NEGATIVE
Protein, UA: NEGATIVE

## 2013-05-31 NOTE — Progress Notes (Signed)
No c/o at this time other than routine pregnancy.Marland Kitchen.  Has had a few visits to MAU for false labor.   Routine questions about pregnancy answered.  F/U in 1 weeks for LROB.

## 2013-06-07 ENCOUNTER — Encounter: Payer: Medicaid Other | Admitting: Women's Health

## 2013-06-07 ENCOUNTER — Inpatient Hospital Stay (HOSPITAL_COMMUNITY): Payer: Medicaid Other | Admitting: Anesthesiology

## 2013-06-07 ENCOUNTER — Encounter (HOSPITAL_COMMUNITY): Payer: Self-pay | Admitting: *Deleted

## 2013-06-07 ENCOUNTER — Inpatient Hospital Stay (HOSPITAL_COMMUNITY)
Admission: AD | Admit: 2013-06-07 | Discharge: 2013-06-09 | DRG: 774 | Disposition: A | Discharge status: Home / Self Care | Payer: Medicaid Other | Source: Ambulatory Visit | Attending: Obstetrics & Gynecology | Admitting: Obstetrics & Gynecology

## 2013-06-07 ENCOUNTER — Encounter (HOSPITAL_COMMUNITY): Payer: Medicaid Other | Admitting: Anesthesiology

## 2013-06-07 DIAGNOSIS — B009 Herpesviral infection, unspecified: Secondary | ICD-10-CM

## 2013-06-07 DIAGNOSIS — IMO0001 Reserved for inherently not codable concepts without codable children: Secondary | ICD-10-CM

## 2013-06-07 DIAGNOSIS — O98519 Other viral diseases complicating pregnancy, unspecified trimester: Secondary | ICD-10-CM

## 2013-06-07 DIAGNOSIS — O0990 Supervision of high risk pregnancy, unspecified, unspecified trimester: Secondary | ICD-10-CM

## 2013-06-07 DIAGNOSIS — O26899 Other specified pregnancy related conditions, unspecified trimester: Secondary | ICD-10-CM

## 2013-06-07 DIAGNOSIS — Z6791 Unspecified blood type, Rh negative: Secondary | ICD-10-CM

## 2013-06-07 DIAGNOSIS — O26879 Cervical shortening, unspecified trimester: Secondary | ICD-10-CM | POA: Diagnosis present

## 2013-06-07 DIAGNOSIS — A6000 Herpesviral infection of urogenital system, unspecified: Secondary | ICD-10-CM | POA: Diagnosis present

## 2013-06-07 LAB — CBC
HCT: 35.7 % — ABNORMAL LOW (ref 36.0–46.0)
Hemoglobin: 12.4 g/dL (ref 12.0–15.0)
MCH: 26.3 pg (ref 26.0–34.0)
MCHC: 34.7 g/dL (ref 30.0–36.0)
MCV: 75.8 fL — ABNORMAL LOW (ref 78.0–100.0)
PLATELETS: 222 10*3/uL (ref 150–400)
RBC: 4.71 MIL/uL (ref 3.87–5.11)
RDW: 14.5 % (ref 11.5–15.5)
WBC: 7.3 10*3/uL (ref 4.0–10.5)

## 2013-06-07 LAB — RPR: RPR: NONREACTIVE

## 2013-06-07 MED ORDER — DIPHENHYDRAMINE HCL 25 MG PO CAPS: 25.0000 mg | ORAL_CAPSULE | Freq: Four times a day (QID) | ORAL | Status: DC | PRN

## 2013-06-07 MED ORDER — LACTATED RINGERS IV SOLN
500.0000 mL | Freq: Once | INTRAVENOUS | Status: AC
  Administered 2013-06-07: 500 mL via INTRAVENOUS

## 2013-06-07 MED ORDER — FENTANYL 2.5 MCG/ML BUPIVACAINE 1/10 % EPIDURAL INFUSION (WH - ANES)
14.0000 mL/h | INTRAMUSCULAR | Status: DC | PRN
  Administered 2013-06-07: 14 mL/h via EPIDURAL
  Filled 2013-06-07 (×2): qty 125

## 2013-06-07 MED ORDER — FENTANYL 2.5 MCG/ML BUPIVACAINE 1/10 % EPIDURAL INFUSION (WH - ANES)
INTRAMUSCULAR | Status: DC | PRN
  Administered 2013-06-07: 14 mL/h via EPIDURAL

## 2013-06-07 MED ORDER — TERBUTALINE SULFATE 1 MG/ML IJ SOLN: 0.2500 mg | Freq: Once | INTRAMUSCULAR | Status: DC | PRN

## 2013-06-07 MED ORDER — PHENYLEPHRINE 40 MCG/ML (10ML) SYRINGE FOR IV PUSH (FOR BLOOD PRESSURE SUPPORT)
80.0000 ug | PREFILLED_SYRINGE | INTRAVENOUS | Status: DC | PRN
  Filled 2013-06-07: qty 2
  Filled 2013-06-07: qty 10

## 2013-06-07 MED ORDER — OXYTOCIN 40 UNITS IN LACTATED RINGERS INFUSION - SIMPLE MED
62.5000 mL/h | INTRAVENOUS | Status: DC
  Filled 2013-06-07: qty 1000

## 2013-06-07 MED ORDER — SIMETHICONE 80 MG PO CHEW: 80.0000 mg | CHEWABLE_TABLET | ORAL | Status: DC | PRN

## 2013-06-07 MED ORDER — LANOLIN HYDROUS EX OINT: TOPICAL_OINTMENT | CUTANEOUS | Status: DC | PRN

## 2013-06-07 MED ORDER — EPHEDRINE 5 MG/ML INJ
10.0000 mg | INTRAVENOUS | Status: DC | PRN
  Filled 2013-06-07: qty 2

## 2013-06-07 MED ORDER — ONDANSETRON HCL 4 MG/2ML IJ SOLN
4.0000 mg | INTRAMUSCULAR | Status: DC | PRN
Start: 2013-06-07 — End: 2013-06-09

## 2013-06-07 MED ORDER — DIPHENHYDRAMINE HCL 50 MG/ML IJ SOLN: 12.5000 mg | INTRAMUSCULAR | Status: DC | PRN

## 2013-06-07 MED ORDER — IBUPROFEN 600 MG PO TABS
600.0000 mg | ORAL_TABLET | Freq: Four times a day (QID) | ORAL | Status: DC
  Administered 2013-06-07 – 2013-06-09 (×6): 600 mg via ORAL
  Filled 2013-06-07 (×6): qty 1

## 2013-06-07 MED ORDER — LIDOCAINE HCL (PF) 1 % IJ SOLN
INTRAMUSCULAR | Status: DC | PRN
  Administered 2013-06-07 (×2): 9 mL

## 2013-06-07 MED ORDER — LACTATED RINGERS IV SOLN: 500.0000 mL | INTRAVENOUS | Status: DC | PRN

## 2013-06-07 MED ORDER — BENZOCAINE-MENTHOL 20-0.5 % EX AERO
1.0000 "application " | INHALATION_SPRAY | CUTANEOUS | Status: DC | PRN
  Administered 2013-06-07: 1 via TOPICAL
  Filled 2013-06-07: qty 56

## 2013-06-07 MED ORDER — CITRIC ACID-SODIUM CITRATE 334-500 MG/5ML PO SOLN: 30.0000 mL | ORAL | Status: DC | PRN

## 2013-06-07 MED ORDER — PHENYLEPHRINE 40 MCG/ML (10ML) SYRINGE FOR IV PUSH (FOR BLOOD PRESSURE SUPPORT)
80.0000 ug | PREFILLED_SYRINGE | INTRAVENOUS | Status: DC | PRN
  Filled 2013-06-07: qty 2

## 2013-06-07 MED ORDER — SENNOSIDES-DOCUSATE SODIUM 8.6-50 MG PO TABS
2.0000 | ORAL_TABLET | ORAL | Status: DC
  Administered 2013-06-08: 2 via ORAL
  Filled 2013-06-07: qty 2

## 2013-06-07 MED ORDER — FLEET ENEMA 7-19 GM/118ML RE ENEM: 1.0000 | ENEMA | RECTAL | Status: DC | PRN

## 2013-06-07 MED ORDER — WITCH HAZEL-GLYCERIN EX PADS: 1.0000 "application " | MEDICATED_PAD | CUTANEOUS | Status: DC | PRN

## 2013-06-07 MED ORDER — OXYMETAZOLINE HCL 0.05 % NA SOLN
1.0000 | Freq: Two times a day (BID) | NASAL | Status: DC | PRN
  Filled 2013-06-07: qty 15

## 2013-06-07 MED ORDER — OXYTOCIN BOLUS FROM INFUSION: 500.0000 mL | INTRAVENOUS | Status: DC

## 2013-06-07 MED ORDER — ACETAMINOPHEN 325 MG PO TABS: 650.0000 mg | ORAL_TABLET | ORAL | Status: DC | PRN

## 2013-06-07 MED ORDER — LIDOCAINE HCL (PF) 1 % IJ SOLN
30.0000 mL | INTRAMUSCULAR | Status: DC | PRN
Start: 2013-06-07 — End: 2013-06-07
  Filled 2013-06-07: qty 30

## 2013-06-07 MED ORDER — ACYCLOVIR 400 MG PO TABS
400.0000 mg | ORAL_TABLET | Freq: Three times a day (TID) | ORAL | Status: DC
  Administered 2013-06-07 – 2013-06-08 (×4): 400 mg via ORAL
  Filled 2013-06-07 (×5): qty 1

## 2013-06-07 MED ORDER — TETANUS-DIPHTH-ACELL PERTUSSIS 5-2.5-18.5 LF-MCG/0.5 IM SUSP: 0.5000 mL | Freq: Once | INTRAMUSCULAR | Status: DC

## 2013-06-07 MED ORDER — EPHEDRINE 5 MG/ML INJ
10.0000 mg | INTRAVENOUS | Status: DC | PRN
  Filled 2013-06-07: qty 2
  Filled 2013-06-07: qty 4

## 2013-06-07 MED ORDER — ONDANSETRON HCL 4 MG PO TABS
4.0000 mg | ORAL_TABLET | ORAL | Status: DC | PRN
Start: 2013-06-07 — End: 2013-06-09

## 2013-06-07 MED ORDER — LACTATED RINGERS IV SOLN
INTRAVENOUS | Status: DC
  Administered 2013-06-07: 12:00:00 via INTRAVENOUS

## 2013-06-07 MED ORDER — PRENATAL MULTIVITAMIN CH
1.0000 | ORAL_TABLET | Freq: Every day | ORAL | Status: DC
  Administered 2013-06-08: 1 via ORAL
  Filled 2013-06-07: qty 1

## 2013-06-07 MED ORDER — ZOLPIDEM TARTRATE 5 MG PO TABS: 5.0000 mg | ORAL_TABLET | Freq: Every evening | ORAL | Status: DC | PRN

## 2013-06-07 MED ORDER — OXYCODONE-ACETAMINOPHEN 5-325 MG PO TABS: 1.0000 | ORAL_TABLET | ORAL | Status: DC | PRN

## 2013-06-07 MED ORDER — ONDANSETRON HCL 4 MG/2ML IJ SOLN: 4.0000 mg | Freq: Four times a day (QID) | INTRAMUSCULAR | Status: DC | PRN

## 2013-06-07 MED ORDER — OXYCODONE-ACETAMINOPHEN 5-325 MG PO TABS
1.0000 | ORAL_TABLET | ORAL | Status: DC | PRN
  Administered 2013-06-07: 1 via ORAL
  Filled 2013-06-07: qty 1

## 2013-06-07 MED ORDER — OXYTOCIN 40 UNITS IN LACTATED RINGERS INFUSION - SIMPLE MED
1.0000 m[IU]/min | INTRAVENOUS | Status: DC
  Administered 2013-06-07: 2 m[IU]/min via INTRAVENOUS

## 2013-06-07 MED ORDER — DIBUCAINE 1 % RE OINT: 1.0000 "application " | TOPICAL_OINTMENT | RECTAL | Status: DC | PRN

## 2013-06-07 MED ORDER — IBUPROFEN 600 MG PO TABS: 600.0000 mg | ORAL_TABLET | Freq: Four times a day (QID) | ORAL | Status: DC | PRN

## 2013-06-07 NOTE — Progress Notes (Signed)
Patient ID: Ruth Mullen, female   DOB: October 27, 1985, 28 y.o.   MRN: [id number] Ruth HammersmithBrittaney N Mullen is a 28 y.o. [id number] at 7453w0d by admitted for Spontaneous Onset of Labor  Subjective: Pt is mostly comfortable on epidural, reports back pain. Advised positional changes   Objective: BP 131/80  Pulse 77  Temp(Src) 97.8 F (36.6 C) (Oral)  Resp 16  Ht 5' (1.524 m)  Wt 69.854 kg (154 lb)  BMI 30.08 kg/m2  SpO2 99%  LMP 09/07/2012      FHT:  FHR: 140 bpm, variability: moderate,  accelerations:  Present,  decelerations:  Absent UC:   regular, every 1-2 minutes SVE:  Labs: Lab Results  Component Value Date   WBC 7.3 06/07/2013   HGB 12.4 06/07/2013   HCT 35.7* 06/07/2013   MCV 75.8* 06/07/2013   PLT 222 06/07/2013    Assessment / Plan: Augmentation of labor, progressing well  Labor: Progressing well on Pitocin Preeclampsia:  no signs or symptoms of toxicity Fetal Wellbeing:  Category I Pain Control:  Epidural I/D:  GBS negative, HSV2 positive. No active lesions Anticipated MOD:  NSVD  Muazu, Aisha 06/07/2013, 4:36 PM  I reviewed the above documentation and agree.   Elfa Wooton, Redmond BasemanKELI L, MD

## 2013-06-07 NOTE — Progress Notes (Signed)
Patient ID: Ruth Mullen, female   DOB: [birth year], 28 y.o.   MRN: [id number] Ruth Mullen is a 28 y.o. [id number] at 8750w0d admitted for Spontaneous Onset of labor  Subjective:  Pt comfortable on epidural. Complete dilation. AROM performed, clear fluid. Head well engaged at 0 station. Baby tolerated procedure well. Pt feeling pressure but no urge to push  Objective: BP 140/78  Pulse 91  Temp(Src) 97.8 F (36.6 C) (Oral)  Resp 16  Ht 5' (1.524 m)  Wt 69.854 kg (154 lb)  BMI 30.08 kg/m2  SpO2 99%  LMP 09/07/2012      FHT:  FHR: 140 bpm, variability: moderate,  accelerations:  Present,  Decelerations: 1 variable with AROM, quick return to baseline UC:   regular, every 2-3 minutes SVE:   Dilation: 10 Effacement (%): 100 Station: 0 Exam by:: J.Thornton, RN  Labs: Lab Results  Component Value Date   WBC 7.3 06/07/2013   HGB 12.4 06/07/2013   HCT 35.7* 06/07/2013   MCV 75.8* 06/07/2013   PLT 222 06/07/2013    Assessment / Plan: Augmentation of labor, progressing well. AROM + End of First stage of labor.   Will begin second stage by allowing Pt to labor down until feeling urge to push.  Labor: Progressing normally. Start of 2nd stage. Fetal Wellbeing:  Category I Pain Control:  Epidural I/D:  GBS neg. HSV2 +ve, no active lesions Anticipated MOD:  NSVD  Muazu, Aisha 06/07/2013, 5:49 PM  I have seen and examined this patient and agree with above documentation in the medical student's note.   Rulon AbideKeli Deziyah Arvin, M.D. [facility] 06/07/2013 6:25 PM

## 2013-06-07 NOTE — Anesthesia Procedure Notes (Signed)
Epidural Patient location during procedure: OB Start time: 06/07/2013 10:06 AM End time: 06/07/2013 10:10 AM  Staffing Anesthesiologist: Leilani AbleHATCHETT, Zakeya Junker Performed by: anesthesiologist   Preanesthetic Checklist Completed: patient identified, surgical consent, pre-op evaluation, timeout performed, IV checked, risks and benefits discussed and monitors and equipment checked  Epidural Patient position: sitting Prep: site prepped and draped and DuraPrep Patient monitoring: continuous pulse ox and blood pressure Approach: midline Injection technique: LOR air  Needle:  Needle type: Tuohy  Needle gauge: 17 G Needle length: 9 cm and 9 Needle insertion depth: 5 cm cm Catheter type: closed end flexible Catheter size: 19 Gauge Catheter at skin depth: 11 cm Test dose: negative and Other  Assessment Sensory level: T9 Events: blood not aspirated, injection not painful, no injection resistance, negative IV test and no paresthesia  Additional Notes Reason for block:procedure for pain

## 2013-06-07 NOTE — H&P (Signed)
Ruth Mullen is a 28 y.o. female presenting for Uterine contractions  Maternal Medical History:  Reason for admission: Contractions.   Contractions: Onset was yesterday.   Frequency: regular.   Duration is approximately 60 seconds.   Perceived severity is strong.    Fetal activity: Perceived fetal activity is normal.   Last perceived fetal movement was within the past hour.    Prenatal complications: McDonald cerlage placed at 22 weeks for shortened cervix. Removed with no complications at 36 weeks    Denies LOF or Vaginal bleeding OB History   Grav Para Term Preterm Abortions TAB SAB Ect Mult Living   4 1 1  2 1 1   1      Past Medical History  Diagnosis Date  . 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 Chlamydia    Past Surgical History  Procedure Laterality Date  . Dilation and curettage of uterus    . Cholecystectomy    . Cervical cerclage N/A 02/08/2013    Procedure: CERCLAGE CERVICAL MCDONALD;  Surgeon: Tilda BurrowJohn V Ferguson, MD;  Location: [facility];  Service: Gynecology;  Laterality: N/A;   Family History: family history is not on file. Social History:  reports that she quit smoking about 8 months ago. Her smoking use included Cigarettes. She has a .25 pack-year smoking history. She has never used smokeless tobacco. She reports that she does not drink alcohol or use illicit drugs.   Prenatal Transfer Tool  Maternal Diabetes: No Genetic Screening: Normal Maternal Ultrasounds/Referrals: Normal Fetal Ultrasounds or other Referrals:  None Maternal Substance Abuse:  Yes:  Type: Marijuana Significant Maternal Medications:  Meds include: Progesterone Other: acyclovir  Significant Maternal Lab Results:  None Other Comments:  GBS neg, Blood group B NEG  Review of Systems  Constitutional: Negative for fever.  HENT: Positive for congestion.   Eyes: Negative for blurred vision.  Respiratory: Negative for cough.   Cardiovascular: Negative for chest pain, palpitations and leg  swelling.  Gastrointestinal: Negative for heartburn, vomiting, diarrhea and constipation.  Genitourinary: Negative for dysuria.  Neurological: Negative for dizziness.   SSE: No visible active HSV2 lesions seen in the vagina or vulvar.  CERVICAL EXAM: Dilation: 5.5 Effacement (%): 90 Cervical Position: Middle Station: -1 Presentation: Vertex Exam by:: Dr. Richarda BladeAdamo Blood pressure 125/72, pulse 85, temperature 98.4 F (36.9 C), temperature source Oral, resp. rate 18, height 5' (1.524 m), weight 69.854 kg (154 lb), last menstrual period 09/07/2012, SpO2 100.00%. Maternal Exam:  Abdomen: not evaluated.  Introitus: Vulva is negative for lesion.  Vagina is negative for ulcerations.  Cervix: Cervix evaluated by sterile speculum exam and digital exam.     Fetal Exam Fetal Monitor Review: Baseline rate: 140.  Variability: moderate (6-25 bpm).   Pattern: accelerations present and no decelerations.    Fetal State Assessment: Category I - tracings are normal.     Physical Exam  Genitourinary: Vulva exhibits no lesion.     Prenatal labs: ABO, Rh: --/--/B NEG, B NEG (06/30 2145) Antibody: NEG (11/20 0930) Rubella: 3.50 (07/08 1115) RPR: NON REAC (11/20 0930)  HBsAg: NEGATIVE (07/08 1115)  HIV: NON REACTIVE (11/20 0930)  GBS: NEGATIVE (01/28 1222)   Assessment/Plan: Ruth Mullen is a 28 y.o. [id number] at 729w0d here for SOOL #Labor: cont to monitor #Pain: Epidural #FWB: Cat 1 #ID:  GBS neg, Rhesus NEG- will need rhogam #MOF: Bottle,will consider trying breastfeeding #MOC: OCP's #Circ:  Female   Muazu, Aisha 06/07/2013, 10:21 AM   I  have seen and examined this patient and agree with above documentation in the medical student's note. 28 yo [id number] @ [redacted]w[redacted]d here for labor. She has a hx of incidentally found shortened cervix with funnelling at [redacted]w[redacted]d and a MacDonald cerclage placed two days later.  Cerclage was clipped at 36 weeks and she is now 39 weeks and term. Admit to L&D.  Active contractions with cervical change. Will manage expectantly currently.  FWB- cat I tracing.   Rulon Abide, M.D. [facility] Fellow 06/07/2013 1:45 PM

## 2013-06-07 NOTE — H&P (Signed)
Attestation of Attending Supervision of Fellow: Evaluation and management procedures were performed by the Fellow under my supervision and collaboration.  I have reviewed the Fellow's note and chart, and I agree with the management and plan.    

## 2013-06-07 NOTE — MAU Note (Signed)
Contractions,  

## 2013-06-07 NOTE — Progress Notes (Signed)
Patient ID: Ruth Mullen, female   DOB: [birth year], 28 y.o.   MRN: [id number] Ruth HammersmithBrittaney N Lortz is a 28 y.o. [id number] at 3673w0d by admitted for Spontaneous Onset of labor  Subjective: Pt is comfortable on epidural, no active complaints  Objective: BP 121/63  Pulse 73  Temp(Src) 98.4 F (36.9 C) (Oral)  Resp 16  Ht 5' (1.524 m)  Wt 69.854 kg (154 lb)  BMI 30.08 kg/m2  SpO2 99%  LMP 09/07/2012      FHT:  FHR: 135 bpm, variability: moderate,  accelerations:  Present,  decelerations:  Absent UC:   regular, every 4-5 minutes SVE:   Dilation: 4 Effacement (%): 80 Station: -1 Exam by:: J.Thornton, RN  Labs: Lab Results  Component Value Date   WBC 7.3 06/07/2013   HGB 12.4 06/07/2013   HCT 35.7* 06/07/2013   MCV 75.8* 06/07/2013   PLT 222 06/07/2013    Assessment / Plan: No Cervical change, will augment with pitocin  Labor: Slow progress, will start pitocin Fetal Wellbeing:  Category I Pain Control:  Epidural I/D:  GBS negative Anticipated MOD:  NSVD  Muazu, Aisha 06/07/2013, 1:29 PM  I have seen and examined this patient and agree with above documentation in the med student's note. Pt was changing cervix and after epidural, contractions have spaced out. No change in the last 2 hours. Will start pitocin 2x2.    Rulon AbideKeli Ashawnti Tangen, M.D. [facility] Fellow 06/07/2013 1:44 PM

## 2013-06-07 NOTE — Anesthesia Preprocedure Evaluation (Signed)
Anesthesia Evaluation  Patient identified by MRN, date of birth, ID band Patient awake    Reviewed: Allergy & Precautions, H&P , NPO status , Patient's Chart, lab work & pertinent test results  Airway Mallampati: I TM Distance: >3 FB Neck ROM: full    Dental no notable dental hx.    Pulmonary neg pulmonary ROS, former smoker,    Pulmonary exam normal       Cardiovascular negative cardio ROS      Neuro/Psych negative neurological ROS  negative psych ROS   GI/Hepatic negative GI ROS, Neg liver ROS,   Endo/Other  negative endocrine ROS  Renal/GU negative Renal ROS  negative genitourinary   Musculoskeletal negative musculoskeletal ROS (+)   Abdominal Normal abdominal exam  (+)   Peds  Hematology negative hematology ROS (+)   Anesthesia Other Findings   Reproductive/Obstetrics (+) Pregnancy                           Anesthesia Physical Anesthesia Plan  ASA: II  Anesthesia Plan: Epidural   Post-op Pain Management:    Induction:   Airway Management Planned:   Additional Equipment:   Intra-op Plan:   Post-operative Plan:   Informed Consent: I have reviewed the patients History and Physical, chart, labs and discussed the procedure including the risks, benefits and alternatives for the proposed anesthesia with the patient or authorized representative who has indicated his/her understanding and acceptance.     Plan Discussed with:   Anesthesia Plan Comments:         Anesthesia Quick Evaluation

## 2013-06-08 MED ORDER — RHO D IMMUNE GLOBULIN 1500 UNIT/2ML IJ SOLN
300.0000 ug | Freq: Once | INTRAMUSCULAR | Status: AC
  Administered 2013-06-08: 300 ug via INTRAMUSCULAR
  Filled 2013-06-08: qty 2

## 2013-06-08 NOTE — Lactation Note (Signed)
This note was copied from the chart of Ruth Mullen. Lactation Consultation Note  Patient Name: Ruth Collene LeydenBrittaney Petree ZOXWR'UToday's Date: 06/08/2013 Reason for consult: Other (Comment) (charting for exclusion)   Maternal Data Reason for exclusion: Mother's choice to formula feed on admision  Feeding Feeding Type: Bottle Fed - Formula Nipple Type: Regular  LATCH Score/Interventions                      Lactation Tools Discussed/Used     Consult Status Consult Status: Complete    Lynda RainwaterBryant, Artice Holohan Parmly 06/08/2013, 2:36 PM

## 2013-06-08 NOTE — Clinical Social Work Maternal (Signed)
Clinical Social Work Department PSYCHOSOCIAL ASSESSMENT - MATERNAL/CHILD 06/08/2013  Patient:  Ruth, Mullen  Account Number:  [id number]  Admit Date:  06/07/2013  Ardine Eng Name:   Ruth Mullen    Clinical Social Worker:  Gerri Spore, LCSW   Date/Time:  06/08/2013 03:38 PM  Date Referred:  06/08/2013   Referral source  CN     Referred reason  Substance Abuse   Other referral source:    I:  FAMILY / [place] legal guardian:  PARENT  Guardian - Name Guardian - Age Guardian - Address  Ruth Mullen 27 [address]; [place], [place] [address]  Ruth Mullen 30 (same as above)   Other household support members/support persons Name Relationship DOB  Ruth Mullen [facility] 11/04/04   Other support:   Ruth Mullen-pts mother    II  PSYCHOSOCIAL DATA Information Source:  Patient Interview  Occupational hygienist Employment:   Museum/gallery curator resources:  [employer] [place]:  [place] Other  [place] / Grade:   Maternity Care Coordinator / Child Services Coordination / Early Interventions:  Cultural issues impacting care:    III  STRENGTHS Strengths  Adequate Resources  Home prepared for Child (including basic supplies)  Supportive family/friends   Strength comment:    IV  RISK FACTORS AND CURRENT PROBLEMS Current Problem:  YES   Risk Factor & Current Problem Patient Issue Family Issue Risk Factor / Current Problem Comment  Substance Abuse Y N Hx of MJ    V  SOCIAL WORK ASSESSMENT CSW met with pt to assess history of MJ use.  Pt admits to smoking MJ "everyday" prior pregnancy confirmation at 3 months.  Pt continued to smoke for a while until she was able to decrease use & eventually stop.  She denies other illegal substance use & verbalized an understanding of hospital drug testing policy.  UDS is negative, meconium results are pending.  FOB at the bedside, bonding with infant & appears supportive.  Pt has all the  necessary supplies for the infant & good family support.  Pt was appropriate & does not seem concerned about drug screen results.  CSW will monitor results & make a referral if needed.      VI SOCIAL WORK PLAN Social Work Plan  No Further Intervention Required / No Barriers to Discharge   Type of pt/family education:   If child protective services report - county:   If child protective services report - date:   Information/referral to community resources comment:   Other social work plan:

## 2013-06-08 NOTE — Progress Notes (Signed)
I have seen and examined this patient and I agree with the above. Cam HaiSHAW, KIMBERLY 9:21 AM 06/08/2013

## 2013-06-08 NOTE — Progress Notes (Signed)
UR chart review completed.  

## 2013-06-08 NOTE — Progress Notes (Signed)
Post Partum Day #1 Subjective: no complaints, up ad lib, voiding, tolerating PO and + flatus  [name] is a 28 y.o. [phone] who presented for onset of contractions at 6450w0d.  She delivered SVD a girl at approximately 6:40pm 06/07/13. She has no complaints and rates her pain as 4/10, that is improving. She is concerned that she is due for a rhogam shot.  Objective: Blood pressure 115/78, pulse 72, temperature 98.3 F (36.8 C), temperature source Oral, resp. rate 18, height 5' (1.524 m), weight 69.854 kg (154 lb), last menstrual period 09/07/2012, SpO2 99.00%, unknown if currently breastfeeding.  Physical Exam:  General: alert, cooperative and no distress Lochia: appropriate Uterine Fundus: firm Incision: N/A DVT Evaluation: No evidence of DVT seen on physical exam. No significant calf/ankle edema.   Recent Labs  06/07/13 0845  HGB 12.4  HCT 35.7*    Assessment/Plan: Discharge home, Breastfeeding, Lactation consult and Contraception POP  [name] is a 28 y.o. [phone] who presented for onset of contractions at 5350w0d.  She delivered SVD a girl at approximately 6:40pm 06/07/13.  She has attempting to breast feed, but has been unsuccessful and would like a lactation consultation. She is agreeable to discharge late this afternoon.   LOS: 1 day   Duane BostonDuke, Angela N 06/08/2013, 7:39 AM

## 2013-06-08 NOTE — Anesthesia Postprocedure Evaluation (Signed)
  Anesthesia Post-op Note  Anesthesia Post Note  Patient: Ruth Mullen  Procedure(s) Performed: * No procedures listed *  Anesthesia type: Epidural  Patient location: Mother/Baby  Post pain: Pain level controlled  Post assessment: Post-op Vital signs reviewed  Last Vitals:  Filed Vitals:   06/08/13 0545  BP: 115/78  Pulse: 72  Temp: 36.8 C  Resp: 18    Post vital signs: Reviewed  Level of consciousness:alert  Complications: No apparent anesthesia complications

## 2013-06-09 LAB — RH IG WORKUP (INCLUDES ABO/RH)
ABO/RH(D): B NEG
ANTIBODY SCREEN: NEGATIVE
Fetal Screen: NEGATIVE
GESTATIONAL AGE(WKS): 39
Unit division: 0

## 2013-06-09 MED ORDER — NORETHINDRONE 0.35 MG PO TABS: 1.0000 | ORAL_TABLET | Freq: Every day | ORAL | Status: DC

## 2013-06-09 NOTE — Discharge Instructions (Signed)

## 2013-06-09 NOTE — Discharge Summary (Signed)
Obstetric Discharge Summary Reason for Admission: onset of labor Prenatal Procedures: cerclage for inicidentally found shortened cervix. Cut at 36 weeks.  Intrapartum Procedures: spontaneous vaginal delivery Postpartum Procedures: none Complications-Operative and Postpartum: 1st degree perineal laceration Hemoglobin  Date Value Ref Range Status  06/07/2013 12.4  12.0 - 15.0 g/dL Final     HCT  Date Value Ref Range Status  06/07/2013 35.7* 36.0 - 46.0 % Final    Physical Exam:  General: alert, cooperative, appears stated age and no distress Lochia: appropriate Uterine Fundus: firm Incision: na DVT Evaluation: No evidence of DVT seen on physical exam. Negative Homan's sign. No cords or calf tenderness. No significant calf/ankle edema.  Discharge Diagnoses: Term Pregnancy-delivered  Discharge Information: Date: 06/09/2013 Activity: pelvic rest Diet: routine Medications: None Condition: stable Instructions: refer to practice specific booklet Discharge to: home Follow-up Information   Follow up with [facility]. Schedule an appointment as soon as possible for a visit in 3 weeks.   Contact information:   [address] [place] [id number] [phone]      Hospital Course [name] was admitted for spontaneous onset of Labor. She delivered via NSVD. No complications this hospital course. Healthy baby girl. Discharged in stable condition. Desires progesterone only pills and is breast feeding.  Newborn Data: Live born female  Birth Weight: 7 lb 3.2 oz (3265 g) APGAR: 9, 9  Home with mother.  Ruth Mullen, Ruth Mullen 06/09/2013, 9:31 AM  I have seen and examined this patient and agree with above documentation in the resident's note.   Ruth Mullen, M.D. [facility] Fellow 06/09/2013 11:43 AM

## 2013-06-09 NOTE — Discharge Summary (Signed)
Attestation of Attending Supervision of Obstetric Fellow: Evaluation and management procedures were performed by the Obstetric Fellow under my supervision and collaboration.  I have reviewed the Obstetric Fellow's note and chart, and I agree with the management and plan.  Cabrina Shiroma, MD, FACOG Attending Obstetrician & Gynecologist Faculty Practice, [facility]   

## 2013-06-14 ENCOUNTER — Telehealth: Payer: Self-pay | Admitting: *Deleted

## 2013-06-14 ENCOUNTER — Encounter: Payer: Self-pay | Admitting: Obstetrics & Gynecology

## 2013-06-14 ENCOUNTER — Ambulatory Visit (INDEPENDENT_AMBULATORY_CARE_PROVIDER_SITE_OTHER): Payer: Medicaid Other | Admitting: Obstetrics & Gynecology

## 2013-06-14 VITALS — BP 140/90 | Wt 143.0 lb

## 2013-06-14 DIAGNOSIS — M62838 Other muscle spasm: Secondary | ICD-10-CM

## 2013-06-14 DIAGNOSIS — O26879 Cervical shortening, unspecified trimester: Secondary | ICD-10-CM

## 2013-06-14 DIAGNOSIS — K219 Gastro-esophageal reflux disease without esophagitis: Secondary | ICD-10-CM

## 2013-06-14 DIAGNOSIS — O99340 Other mental disorders complicating pregnancy, unspecified trimester: Secondary | ICD-10-CM

## 2013-06-14 DIAGNOSIS — O9989 Other specified diseases and conditions complicating pregnancy, childbirth and the puerperium: Secondary | ICD-10-CM

## 2013-06-14 DIAGNOSIS — O99019 Anemia complicating pregnancy, unspecified trimester: Secondary | ICD-10-CM

## 2013-06-14 DIAGNOSIS — O98519 Other viral diseases complicating pregnancy, unspecified trimester: Secondary | ICD-10-CM

## 2013-06-14 DIAGNOSIS — O360990 Maternal care for other rhesus isoimmunization, unspecified trimester, not applicable or unspecified: Secondary | ICD-10-CM

## 2013-06-14 DIAGNOSIS — O09299 Supervision of pregnancy with other poor reproductive or obstetric history, unspecified trimester: Secondary | ICD-10-CM

## 2013-06-14 MED ORDER — OMEPRAZOLE 20 MG PO CPDR: 20.0000 mg | DELAYED_RELEASE_CAPSULE | Freq: Every day | ORAL | Status: DC

## 2013-06-14 MED ORDER — CYCLOBENZAPRINE HCL 10 MG PO TABS: 10.0000 mg | ORAL_TABLET | Freq: Three times a day (TID) | ORAL | Status: DC | PRN

## 2013-06-14 NOTE — Progress Notes (Signed)
Patient ID: Ruth HammersmithBrittaney N Kingry, female   DOB: October 27, 1985, 28 y.o.   MRN: [id number] Pt with right trapezius spasm close to the occiput  Also with GERD  Injected with 5 cc 0.5% marcaine  rx Flexeril Prilosec(bottle feeding)

## 2013-06-14 NOTE — Telephone Encounter (Signed)
Spoke with pt and she stated that she started having chest pain yesterday in the middle of her chest, she described as a pill being stuck that will not go down. The pt stated that she is also having right neck pain, that is throbbing and very sharp, she has had that pain since Sunday. The pt denied any blurred vision, shortness of breath, or headaches. I spoke with Joellyn HaffKim Booker and she advised to have the pt to come in to be checked. PT advised of this and verbalized understanding.

## 2013-06-14 NOTE — Addendum Note (Signed)
Addended by: Lazaro ArmsEURE, LUTHER H on: 06/14/2013 02:51 PM   Modules accepted: Orders

## 2013-06-17 ENCOUNTER — Encounter (HOSPITAL_COMMUNITY): Payer: Self-pay | Admitting: Emergency Medicine

## 2013-06-17 ENCOUNTER — Emergency Department (HOSPITAL_COMMUNITY): Payer: Medicaid Other

## 2013-06-17 ENCOUNTER — Emergency Department (HOSPITAL_COMMUNITY)
Admission: EM | Admit: 2013-06-17 | Discharge: 2013-06-17 | Disposition: A | Discharge status: Home / Self Care | Payer: Medicaid Other | Attending: Emergency Medicine | Admitting: Emergency Medicine

## 2013-06-17 DIAGNOSIS — R079 Chest pain, unspecified: Secondary | ICD-10-CM

## 2013-06-17 DIAGNOSIS — K219 Gastro-esophageal reflux disease without esophagitis: Secondary | ICD-10-CM | POA: Insufficient documentation

## 2013-06-17 DIAGNOSIS — Z79899 Other long term (current) drug therapy: Secondary | ICD-10-CM | POA: Insufficient documentation

## 2013-06-17 DIAGNOSIS — O909 Complication of the puerperium, unspecified: Principal | ICD-10-CM

## 2013-06-17 DIAGNOSIS — Z8619 Personal history of other infectious and parasitic diseases: Secondary | ICD-10-CM | POA: Insufficient documentation

## 2013-06-17 DIAGNOSIS — Z87891 Personal history of nicotine dependence: Secondary | ICD-10-CM | POA: Insufficient documentation

## 2013-06-17 DIAGNOSIS — M542 Cervicalgia: Secondary | ICD-10-CM | POA: Insufficient documentation

## 2013-06-17 DIAGNOSIS — O26899 Other specified pregnancy related conditions, unspecified trimester: Secondary | ICD-10-CM | POA: Insufficient documentation

## 2013-06-17 DIAGNOSIS — R072 Precordial pain: Secondary | ICD-10-CM | POA: Insufficient documentation

## 2013-06-17 MED ORDER — DIAZEPAM 5 MG PO TABS: 5.0000 mg | ORAL_TABLET | Freq: Four times a day (QID) | ORAL | Status: DC | PRN

## 2013-06-17 MED ORDER — GI COCKTAIL ~~LOC~~
30.0000 mL | Freq: Once | ORAL | Status: AC
  Administered 2013-06-17: 30 mL via ORAL
  Filled 2013-06-17: qty 30

## 2013-06-17 MED ORDER — IBUPROFEN 400 MG PO TABS
600.0000 mg | ORAL_TABLET | Freq: Once | ORAL | Status: AC
  Administered 2013-06-17: 600 mg via ORAL
  Filled 2013-06-17: qty 2

## 2013-06-17 NOTE — ED Notes (Signed)
States she had a baby 10 days ago and a week ago started having pain in neck and chest area, states she was evaluated by her ob/gyn and given muscle relaxants for same, denies any improvement in neck pain and states it has gotten worse and she feels like a pill is stuck in her chest

## 2013-06-17 NOTE — ED Notes (Signed)
Dr. Goldston at bedside to assess pt 

## 2013-06-17 NOTE — ED Provider Notes (Signed)
CSN: [id number]     Arrival date & time 06/17/13  1039 History  This chart was scribed for Audree Camel, MD by Quintella Reichert, ED scribe.  This patient was seen in room APA18/APA18 and the patient's care was started at 11:34 AM.   Chief Complaint  Patient presents with  . Chest Pain    The history is provided by the patient. No language interpreter was used.    HPI Comments: Ruth Mullen is a 28 y.o. female with no chronic medical conditions who presents to the Emergency Department complaining of 5 days of constant left-sided chest pain.  Pt states that 6 days ago she awoke with right-sided neck pain.  The following night she developed mid sternal chest pain that "came out of nowhere." She does not remember exactly what she was doing when it began.  She describes pain as a feeling of something being "stuck" in her chest, with occasional throbbing pain.  She denies burning pain.  It is worsened by lying flat and with holding her breath. Worsens at night.  It does not radiate.   Pt states she was seen by her OB/GYN 4 days ago for her chest pain and diagnosed with GERD and placed on Flexeril, which she has been taking without relief.  She denies difficulty swallowing, nausea, vomiting, fever, cough, back pain, abdominal pain, leg swelling or leg pain.  She notes that she had an epidural for a delivery 10 days ago.  The baby was delivered vaginally without complications.  She is not breast-feeding.     Past Medical History  Diagnosis Date  . 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 Chlamydia     Past Surgical History  Procedure Laterality Date  . Dilation and curettage of uterus    . Cholecystectomy    . Cervical cerclage N/A 02/08/2013    Procedure: CERCLAGE CERVICAL MCDONALD;  Surgeon: Tilda Burrow, MD;  Location: [facility];  Service: Gynecology;  Laterality: N/A;    No family history on file.   History  Substance Use Topics  . Smoking status: Former Smoker -- 0.25 packs/day  for 1 years    Types: Cigarettes    Quit date: 09/25/2012  . Smokeless tobacco: Never Used  . Alcohol Use: No    OB History   Grav Para Term Preterm Abortions TAB SAB Ect Mult Living   4 2 2  2 1 1   2        Review of Systems  Constitutional: Negative for fever.  HENT: Negative for trouble swallowing.   Respiratory: Negative for cough.   Cardiovascular: Positive for chest pain. Negative for leg swelling.  Gastrointestinal: Negative for nausea, vomiting and abdominal pain.  Musculoskeletal: Positive for neck pain. Negative for back pain.  All other systems reviewed and are negative.      Allergies  Review of patient's allergies indicates no known allergies.  Home Medications   Current Outpatient Rx  Name  Route  Sig  Dispense  Refill  . cyclobenzaprine (FLEXERIL) 10 MG tablet   Oral   Take 1 tablet (10 mg total) by mouth 3 (three) times daily as needed for muscle spasms.   45 tablet   1   . norethindrone (MICRONOR,CAMILA,ERRIN) 0.35 MG tablet   Oral   Take 1 tablet (0.35 mg total) by mouth daily. Take at the exact same time every day.   1 Package   11   . omeprazole (PRILOSEC) 20 MG capsule  Oral   Take 1 capsule (20 mg total) by mouth daily. 1 tablet a day   30 capsule   6   . Prenatal Vit-Fe Fumarate-FA (PRENATAL MULTIVITAMIN) TABS   Oral   Take 1 tablet by mouth at bedtime.          BP 151/88  Pulse 79  Temp(Src) 98 F (36.7 C) (Oral)  Resp 18  Ht 5' (1.524 m)  Wt 146 lb (66.225 kg)  BMI 28.51 kg/m2  SpO2 97%  LMP 09/07/2012  Physical Exam  Nursing note and vitals reviewed. Constitutional: She is oriented to person, place, and time. She appears well-developed and well-nourished. No distress.  HENT:  Head: Normocephalic and atraumatic.  Neck: Neck supple. No tracheal deviation present.  Mild right lateral neck tenderness.   Cardiovascular: Normal rate, regular rhythm and normal heart sounds.   No murmur heard. Pulmonary/Chest: Effort  normal and breath sounds normal. No respiratory distress. She has no wheezes. She has no rales. She exhibits tenderness (mild tenderness of sternum).  Musculoskeletal: Normal range of motion. She exhibits no edema and no tenderness.  Neurological: She is alert and oriented to person, place, and time.  Skin: Skin is warm and dry.  Psychiatric: She has a normal mood and affect. Her behavior is normal.    ED Course  Procedures (including critical care time)  DIAGNOSTIC STUDIES: Oxygen Saturation is 97% on room air, normal by my interpretation.    COORDINATION OF CARE: 11:41 AM-Informed pt that EKG is unconcerning.  Discussed treatment plan which includes CXR with pt at bedside and pt agreed to plan.   Labs Review Labs Reviewed - No data to display  Imaging Review Dg Chest 2 View  06/17/2013   CLINICAL DATA:  Chest pain  EXAM: CHEST  2 VIEW  COMPARISON:  08/23/2012  FINDINGS: The heart size and mediastinal contours are within normal limits. Both lungs are clear. The visualized skeletal structures are unremarkable.  IMPRESSION: No active cardiopulmonary disease.   Electronically Signed   By: Ruel Favorsrevor  Shick M.D.   On: 06/17/2013 12:41    EKG Interpretation    Date/Time:  Saturday June 17 2013 11:02:07 EST Ventricular Rate:  73 PR Interval:  130 QRS Duration: 74 QT Interval:  384 QTC Calculation: 423 R Axis:   64 Text Interpretation:  Normal sinus rhythm Normal ECG No previous ECGs available Confirmed by Jeraldin Fesler  MD, Davone Shinault ([id number]) on 06/17/2013 11:42:19 AM            MDM   Final diagnoses:  Chest pain  Neck pain    Patient's sx are likely GI. With normal EKG and very atypical pain I do not feel ACS w/u indicated. Likely GERD vs esophagitis given her "pill" sensation. Able to swallow, no vomiting or drooling. Improved with GI cocktail. Will recommend continuing PPI and H2 blockers as needed. Neck likely a strain as it occurred after awaking and worse with ROM and  palpation. Will d/c flexeril and try valium in case there is a spasm component. No stiffness, headache, fevers or neuro sx to be concerned about RPA, PTA, or meningitis/encephalitis. Will treat symptomatically and have her f/u with PCP.     I personally performed the services described in this documentation, which was scribed in my presence. The recorded information has been reviewed and is accurate.   Audree CamelScott T Ambrosio Reuter, MD 06/17/13 [phone]

## 2013-06-17 NOTE — Discharge Instructions (Signed)
Chest Pain (Nonspecific) °It is often hard to give a specific diagnosis for the cause of chest pain. There is always a chance that your pain could be related to something serious, such as a heart attack or a blood clot in the lungs. You need to follow up with your caregiver for further evaluation. °CAUSES  °· Heartburn. °· Pneumonia or bronchitis. °· Anxiety or stress. °· Inflammation around your heart (pericarditis) or lung (pleuritis or pleurisy). °· A blood clot in the lung. °· A collapsed lung (pneumothorax). It can develop suddenly on its own (spontaneous pneumothorax) or from injury (trauma) to the chest. °· Shingles infection (herpes zoster virus). °The chest wall is composed of bones, muscles, and cartilage. Any of these can be the source of the pain. °· The bones can be bruised by injury. °· The muscles or cartilage can be strained by coughing or overwork. °· The cartilage can be affected by inflammation and become sore (costochondritis). °DIAGNOSIS  °Lab tests or other studies, such as X-rays, electrocardiography, stress testing, or cardiac imaging, may be needed to find the cause of your pain.  °TREATMENT  °· Treatment depends on what may be causing your chest pain. Treatment may include: °· Acid blockers for heartburn. °· Anti-inflammatory medicine. °· Pain medicine for inflammatory conditions. °· Antibiotics if an infection is present. °· You may be advised to change lifestyle habits. This includes stopping smoking and avoiding alcohol, caffeine, and chocolate. °· You may be advised to keep your head raised (elevated) when sleeping. This reduces the chance of acid going backward from your stomach into your esophagus. °· Most of the time, nonspecific chest pain will improve within 2 to 3 days with rest and mild pain medicine. °HOME CARE INSTRUCTIONS  °· If antibiotics were prescribed, take your antibiotics as directed. Finish them even if you start to feel better. °· For the next few days, avoid physical  activities that bring on chest pain. Continue physical activities as directed. °· Do not smoke. °· Avoid drinking alcohol. °· Only take over-the-counter or prescription medicine for pain, discomfort, or fever as directed by your caregiver. °· Follow your caregiver's suggestions for further testing if your chest pain does not go away. °· Keep any follow-up appointments you made. If you do not go to an appointment, you could develop lasting (chronic) problems with pain. If there is any problem keeping an appointment, you must call to reschedule. °SEEK MEDICAL CARE IF:  °· You think you are having problems from the medicine you are taking. Read your medicine instructions carefully. °· Your chest pain does not go away, even after treatment. °· You develop a rash with blisters on your chest. °SEEK IMMEDIATE MEDICAL CARE IF:  °· You have increased chest pain or pain that spreads to your arm, neck, jaw, back, or abdomen. °· You develop shortness of breath, an increasing cough, or you are coughing up blood. °· You have severe back or abdominal pain, feel nauseous, or vomit. °· You develop severe weakness, fainting, or chills. °· You have a fever. °THIS IS AN EMERGENCY. Do not wait to see if the pain will go away. Get medical help at once. Call your local emergency services (911 in [place] Do not drive yourself to the hospital. °MAKE SURE YOU:  °· Understand these instructions. °· Will watch your condition. °· Will get help right away if you are not doing well or get worse. °Document Released: 01/21/2005 Document Revised: 07/06/2011 Document Reviewed: 11/17/2007 °ExitCare® Patient Information ©2014 ExitCare,   LLC. ° °

## 2013-07-04 ENCOUNTER — Ambulatory Visit: Payer: Medicaid Other | Admitting: Women's Health

## 2013-07-04 ENCOUNTER — Encounter: Payer: Self-pay | Admitting: Women's Health

## 2013-07-04 ENCOUNTER — Ambulatory Visit (INDEPENDENT_AMBULATORY_CARE_PROVIDER_SITE_OTHER): Payer: Medicaid Other | Admitting: Women's Health

## 2013-07-04 VITALS — BP 138/90 | Ht 60.0 in | Wt 146.0 lb

## 2013-07-04 DIAGNOSIS — Z3480 Encounter for supervision of other normal pregnancy, unspecified trimester: Secondary | ICD-10-CM

## 2013-07-04 DIAGNOSIS — Z3202 Encounter for pregnancy test, result negative: Secondary | ICD-10-CM

## 2013-07-04 LAB — POCT URINE PREGNANCY: Preg Test, Ur: NEGATIVE

## 2013-07-04 NOTE — Progress Notes (Signed)
Patient ID: Ruth Mullen, female   DOB: Dec 04, 1985, 28 y.o.   MRN: [id number] Subjective:    Ruth Mullen is a 28 y.o. [phone] African American female who presents for a postpartum visit. She is 4 weeks postpartum following a spontaneous vaginal delivery at 39 gestational weeks. Anesthesia: epidural. I have fully reviewed the prenatal and intrapartum course. Postpartum course has been uncomplicated. Baby's course has been uncomplicated. Baby is feeding by bottle. Bleeding no bleeding. Bowel function is normal. Bladder function is normal. Patient is not sexually active. Last sexual activity: Oct. Contraception method is Pharmacist, hospitalmicronor, doing well w/ taking them, but would like to get on COCs. Postpartum depression screening: negative. Score 7.  Last pap unsure, maybe ~5337yrs ago and was neg.  The following portions of the patient's history were reviewed and updated as appropriate: allergies, current medications, past medical history, past surgical history and problem list.  Review of Systems Pertinent items are noted in HPI.   Filed Vitals:   07/04/13 1528  BP: 138/90  Height: 5' (1.524 m)  Weight: 146 lb (66.225 kg)    Objective:     General:  alert, cooperative and no distress   Breasts:  deferred, no complaints  Lungs: clear to auscultation bilaterally  Heart:  regular rate and rhythm  Abdomen: soft, nontender   Vulva: normal  Vagina: normal vagina  Cervix:  Closed, anterior  Corpus: Well-involuted  Adnexa:  Non-palpable  Rectal Exam: No hemorrhoids        Assessment:   Postpartum exam 4 wks s/p SVD Depression screening Contraception counseling  Slightly elevated bp today Bottlefeeding Plan:   Contraception: micronor currently-doing well taking them at same time daily, wants to switch to COCs. BP slightly   elevated today. Will recheck when she returns in 2wks for pap and discuss COCs at that time.  Follow up in: 2 weeks for pap & physical, or earlier as needed.    Marge DuncansBooker, Amsi Grimley Randall CNM, [facility] 07/04/2013 4:13 PM

## 2013-07-18 ENCOUNTER — Encounter: Payer: Self-pay | Admitting: Women's Health

## 2013-07-18 ENCOUNTER — Ambulatory Visit (INDEPENDENT_AMBULATORY_CARE_PROVIDER_SITE_OTHER): Payer: Medicaid Other | Admitting: Women's Health

## 2013-07-18 ENCOUNTER — Other Ambulatory Visit (HOSPITAL_COMMUNITY)
Admission: RE | Admit: 2013-07-18 | Discharge: 2013-07-18 | Disposition: A | Discharge status: Home / Self Care | Payer: Medicaid Other | Source: Ambulatory Visit | Attending: Obstetrics & Gynecology | Admitting: Obstetrics & Gynecology

## 2013-07-18 VITALS — BP 140/90 | Ht 61.75 in | Wt 146.0 lb

## 2013-07-18 DIAGNOSIS — Z01419 Encounter for gynecological examination (general) (routine) without abnormal findings: Secondary | ICD-10-CM | POA: Insufficient documentation

## 2013-07-18 DIAGNOSIS — Z8759 Personal history of other complications of pregnancy, childbirth and the puerperium: Secondary | ICD-10-CM

## 2013-07-18 DIAGNOSIS — Z0000 Encounter for general adult medical examination without abnormal findings: Secondary | ICD-10-CM

## 2013-07-18 DIAGNOSIS — Z8742 Personal history of other diseases of the female genital tract: Secondary | ICD-10-CM | POA: Insufficient documentation

## 2013-07-18 DIAGNOSIS — I10 Essential (primary) hypertension: Secondary | ICD-10-CM

## 2013-07-18 LAB — COMPREHENSIVE METABOLIC PANEL
ALT: 23 U/L (ref 0–35)
AST: 21 U/L (ref 0–37)
Albumin: 4.2 g/dL (ref 3.5–5.2)
Alkaline Phosphatase: 88 U/L (ref 39–117)
BILIRUBIN TOTAL: 0.9 mg/dL (ref 0.2–1.2)
BUN: 4 mg/dL — ABNORMAL LOW (ref 6–23)
CO2: 28 meq/L (ref 19–32)
CREATININE: 0.55 mg/dL (ref 0.50–1.10)
Calcium: 9.8 mg/dL (ref 8.4–10.5)
Chloride: 103 mEq/L (ref 96–112)
Glucose, Bld: 83 mg/dL (ref 70–99)
Potassium: 4.3 mEq/L (ref 3.5–5.3)
SODIUM: 140 meq/L (ref 135–145)
Total Protein: 7.1 g/dL (ref 6.0–8.3)

## 2013-07-18 LAB — CBC
HCT: 40.5 % (ref 36.0–46.0)
Hemoglobin: 13.9 g/dL (ref 12.0–15.0)
MCH: 25.5 pg — AB (ref 26.0–34.0)
MCHC: 34.3 g/dL (ref 30.0–36.0)
MCV: 74.3 fL — ABNORMAL LOW (ref 78.0–100.0)
PLATELETS: 340 10*3/uL (ref 150–400)
RBC: 5.45 MIL/uL — ABNORMAL HIGH (ref 3.87–5.11)
RDW: 15.3 % (ref 11.5–15.5)
WBC: 6.3 10*3/uL (ref 4.0–10.5)

## 2013-07-18 LAB — LIPID PANEL
CHOLESTEROL: 192 mg/dL (ref 0–200)
HDL: 57 mg/dL (ref >39)
LDL Cholesterol: 121 mg/dL — ABNORMAL HIGH (ref 0–99)
Total CHOL/HDL Ratio: 3.4 Ratio
Triglycerides: 71 mg/dL (ref <150)
VLDL: 14 mg/dL (ref 0–40)

## 2013-07-18 LAB — TSH: TSH: 0.843 u[IU]/mL (ref 0.350–4.500)

## 2013-07-18 MED ORDER — AMLODIPINE BESYLATE 5 MG PO TABS: 5.0000 mg | ORAL_TABLET | Freq: Every day | ORAL | Status: DC

## 2013-07-18 NOTE — Patient Instructions (Signed)

## 2013-07-18 NOTE — Progress Notes (Signed)
Patient ID: Ruth Mullen, female   DOB: 1985/10/28, 28 y.o.   MRN: [id number] Subjective:     Ruth Mullen is a 28 y.o. [phone] African American  female here for a routine well-woman exam.  She is 6wks postpartum SVD. Bottlefeeding. Her bp was slightly elevated 2wks ago at pp visit. She has no h/o HTN, bp's were normal during pregnancy. Currently taking micronor, but would like to switch to something else. Reviewed progestin-only options, or combined options after bp normal. Is interested in Mirena, reviewed r/b.  Would like to take brochure home and think about it.   Patient's last menstrual period was 07/16/2013.  Current complaints: none Smoking Status: Quit in June 2014 Does desire labs. Does not have a PCP.   The following portions of the patient's history were reviewed and updated as appropriate: allergies, current medications, past family history, past medical history, past social history, past surgical history and problem list.   Gynecologic History Patient's last menstrual period was 07/16/2013. Contraception: oral progesterone-only contraceptive Last Pap: unsure, >58yrs ago. Results were: normal Last mammogram: never. Results were: n/a  Obstetric History OB History  Gravida Para Term Preterm AB SAB TAB Ectopic Multiple Living  4 2 2  2 1 1   2     # Outcome Date GA Lbr Len/2nd Weight Sex Delivery Anes PTL Lv  4 TRM 06/07/13 [redacted]w[redacted]d 14:47 / 00:53 7 lb 3.2 oz (3.265 kg) F SVD EPI  Y  3 TRM 11/04/04 [redacted]w[redacted]d 12:00 6 lb 5 oz (2.863 kg) M SVD EPI N Y  2 SAB              Comments: D&C  1 TAB               Review of Systems Patient denies any headaches, blurred vision, shortness of breath, chest pain, abdominal pain, problems with bowel movements, urination, or intercourse.      Objective:     Physical Exam  BP 140/90  Ht 5' 1.75" (1.568 m)  Wt 146 lb (66.225 kg)  BMI 26.94 kg/m2  LMP 07/16/2013  Breastfeeding? No BP retake: 142/98  General:  Well developed, well  nourished, no acute distress. She is alert and oriented x3. Skin:  Warm and dry Neck:  Midline trachea, no thyromegaly or nodules Cardiovascular: Regular rate and rhythm, no murmur heard Lungs:  Effort normal, all lung fields clear to auscultation bilaterally Breasts:  No dominant palpable mass, retraction, or nipple discharge Abdomen:  Soft, non tender, no hepatosplenomegaly or masses Pelvic:  External genitalia is normal in appearance.  The vagina is normal in appearance. The cervix is bulbous, no CMT.  Thin prep pap is done w/ reflex HR HPV cotesting. Uterus is felt to be normal size, shape, and contour.  No adnexal masses or tenderness noted. Extremities:  No swelling or varicosities noted Psych:  She has a normal mood and affect     Assessment:     Healthy well-woman exam HTN Bottlefeeding Contraception counseling BMI 26.94     Plan:  Rx Norvasc 5mg  daily # 30 w/ 6RF Take bp's at home, keep log Recommended losing 10-15lbs by improving diet, increasing exercise- just got membership to [facility] F/U 2wks to see how bp is doing, to call and let us know if she decides she wants Mirena at same time. No sex x 10-14d prior to Mirena.  Mammogram @ 26yo or sooner if problems Colonoscopy @ 28yo or sooner if problems   Grace Bushy, Cheron Every  CNM, [facility] 07/18/2013 11:53 AM

## 2013-08-01 ENCOUNTER — Encounter: Payer: Self-pay | Admitting: Women's Health

## 2013-08-01 ENCOUNTER — Ambulatory Visit (INDEPENDENT_AMBULATORY_CARE_PROVIDER_SITE_OTHER): Payer: Medicaid Other | Admitting: Women's Health

## 2013-08-01 VITALS — BP 140/80 | Ht 60.0 in | Wt 146.0 lb

## 2013-08-01 DIAGNOSIS — Z0130 Encounter for examination of blood pressure without abnormal findings: Secondary | ICD-10-CM

## 2013-08-01 DIAGNOSIS — Z3009 Encounter for other general counseling and advice on contraception: Secondary | ICD-10-CM

## 2013-08-01 DIAGNOSIS — Z3202 Encounter for pregnancy test, result negative: Secondary | ICD-10-CM

## 2013-08-01 LAB — POCT URINE PREGNANCY: Preg Test, Ur: NEGATIVE

## 2013-08-01 NOTE — Patient Instructions (Signed)
Contraception Choices Contraception (birth control) is the use of any methods or devices to prevent pregnancy. Below are some methods to help avoid pregnancy. HORMONAL METHODS   Contraceptive implant This is a thin, plastic tube containing progesterone hormone. It does not contain estrogen hormone. Your health care provider inserts the tube in the inner part of the upper arm. The tube can remain in place for up to 3 years. After 3 years, the implant must be removed. The implant prevents the ovaries from releasing an egg (ovulation), thickens the cervical mucus to prevent sperm from entering the uterus, and thins the lining of the inside of the uterus.  Progesterone-only injections These injections are given every 3 months by your health care provider to prevent pregnancy. This synthetic progesterone hormone stops the ovaries from releasing eggs. It also thickens cervical mucus and changes the uterine lining. This makes it harder for sperm to survive in the uterus.  Birth control pills These pills contain estrogen and progesterone hormone. They work by preventing the ovaries from releasing eggs (ovulation). They also cause the cervical mucus to thicken, preventing the sperm from entering the uterus. Birth control pills are prescribed by a health care provider.Birth control pills can also be used to treat heavy periods.  Minipill This type of birth control pill contains only the progesterone hormone. They are taken every day of each month and must be prescribed by your health care provider.  Birth control patch The patch contains hormones similar to those in birth control pills. It must be changed once a week and is prescribed by a health care provider.  Vaginal ring The ring contains hormones similar to those in birth control pills. It is left in the vagina for 3 weeks, removed for 1 week, and then a new one is put back in place. The patient must be comfortable inserting and removing the ring from the  vagina.A health care provider's prescription is necessary.  Emergency contraception Emergency contraceptives prevent pregnancy after unprotected sexual intercourse. This pill can be taken right after sex or up to 5 days after unprotected sex. It is most effective the sooner you take the pills after having sexual intercourse. Most emergency contraceptive pills are available without a prescription. Check with your pharmacist. Do not use emergency contraception as your only form of birth control. BARRIER METHODS   Female condom This is a thin sheath (latex or rubber) that is worn over the penis during sexual intercourse. It can be used with spermicide to increase effectiveness.  Female condom. This is a soft, loose-fitting sheath that is put into the vagina before sexual intercourse.  Diaphragm This is a soft, latex, dome-shaped barrier that must be fitted by a health care provider. It is inserted into the vagina, along with a spermicidal jelly. It is inserted before intercourse. The diaphragm should be left in the vagina for 6 to 8 hours after intercourse.  Cervical cap This is a round, soft, latex or plastic cup that fits over the cervix and must be fitted by a health care provider. The cap can be left in place for up to 48 hours after intercourse.  Sponge This is a soft, circular piece of polyurethane foam. The sponge has spermicide in it. It is inserted into the vagina after wetting it and before sexual intercourse.  Spermicides These are chemicals that kill or block sperm from entering the cervix and uterus. They come in the form of creams, jellies, suppositories, foam, or tablets. They do not require a   prescription. They are inserted into the vagina with an applicator before having sexual intercourse. The process must be repeated every time you have sexual intercourse. INTRAUTERINE CONTRACEPTION  Intrauterine device (IUD) This is a T-shaped device that is put in a woman's uterus during a  menstrual period to prevent pregnancy. There are 2 types:  Copper IUD This type of IUD is wrapped in copper wire and is placed inside the uterus. Copper makes the uterus and fallopian tubes produce a fluid that kills sperm. It can stay in place for 10 years.  Hormone IUD This type of IUD contains the hormone progestin (synthetic progesterone). The hormone thickens the cervical mucus and prevents sperm from entering the uterus, and it also thins the uterine lining to prevent implantation of a fertilized egg. The hormone can weaken or kill the sperm that get into the uterus. It can stay in place for 3 5 years, depending on which type of IUD is used. PERMANENT METHODS OF CONTRACEPTION  Female tubal ligation This is when the woman's fallopian tubes are surgically sealed, tied, or blocked to prevent the egg from traveling to the uterus.  Hysteroscopic sterilization This involves placing a small coil or insert into each fallopian tube. Your doctor uses a technique called hysteroscopy to do the procedure. The device causes scar tissue to form. This results in permanent blockage of the fallopian tubes, so the sperm cannot fertilize the egg. It takes about 3 months after the procedure for the tubes to become blocked. You must use another form of birth control for these 3 months.  Female sterilization This is when the female has the tubes that carry sperm tied off (vasectomy).This blocks sperm from entering the vagina during sexual intercourse. After the procedure, the man can still ejaculate fluid (semen). NATURAL PLANNING METHODS  Natural family planning This is not having sexual intercourse or using a barrier method (condom, diaphragm, cervical cap) on days the woman could become pregnant.  Calendar method This is keeping track of the length of each menstrual cycle and identifying when you are fertile.  Ovulation method This is avoiding sexual intercourse during ovulation.  Symptothermal method This is  avoiding sexual intercourse during ovulation, using a thermometer and ovulation symptoms.  Post ovulation method This is timing sexual intercourse after you have ovulated. Regardless of which type or method of contraception you choose, it is important that you use condoms to protect against the transmission of sexually transmitted infections (STIs). Talk with your health care provider about which form of contraception is most appropriate for you. Document Released: 04/13/2005 Document Revised: 12/14/2012 Document Reviewed: 10/06/2012 ExitCare Patient Information 2014 Exit[employer], LLC.  

## 2013-08-01 NOTE — Progress Notes (Signed)
Patient ID: Ruth HammersmithBrittaney N Hollen, female   DOB: 1985/07/20, 28 y.o.   MRN: [id number]   [facility]  Patient name: Ruth Mullen MRN [id number]  Date of birth: 1985/07/20  CC & HPI:  Ruth HammersmithBrittaney N Brevik is a 28 y.o. African American female presenting today for bp check after being placed on norvasc 5mg  daily for mild HTN, and to further discuss contraception options. States she's been taking norvasc at night b/c it made her sleepy the one day she took it during day, so she hasn't had it today. Still uncertain about what she wants to do for contraception, reviewed options again. Thinks she just wants to stay on micronor for right now. Depo and nexplanon caused prolonged bleeding. Has read back reviews that mirena can cause infections, etc. Thoroughly discussed r/b mirena.   Pertinent History Reviewed:  Medical & Surgical Hx:   Past Medical History  Diagnosis Date  . 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 Chlamydia    Past Surgical History  Procedure Laterality Date  . Dilation and curettage of uterus    . Cholecystectomy    . Cervical cerclage N/A 02/08/2013    Procedure: CERCLAGE CERVICAL MCDONALD;  Surgeon: Tilda BurrowJohn V Ferguson, MD;  Location: [facility];  Service: Gynecology;  Laterality: N/A;   Medications: Reviewed & Updated - see associated section Social History: Reviewed -  reports that she quit smoking about 10 months ago. Her smoking use included Cigarettes. She has a .25 pack-year smoking history. She has never used smokeless tobacco.  Objective Findings:  Vitals: BP 140/80  Ht 5' (1.524 m)  Wt 146 lb (66.225 kg)  BMI 28.51 kg/m2  LMP 07/16/2013  Breastfeeding? No  BP recheck 130/80  Physical Examination: General appearance - alert, well appearing, and in no distress  Results for orders placed in visit on 08/01/13 (from the past 24 hour(s))  POCT URINE PREGNANCY   Collection Time    08/01/13  2:05 PM      Result Value Ref Range   Preg Test, Ur Negative        Assessment & Plan:  A:   Mild HTN  Contraception counseling P:  To try to take Norvasc in AM when it will give her greatest benefit throughout day  Wants to continue micronor for now, will call us if she decides for anything else    Marge DuncansBooker, Leanor Voris Randall CNM, [facility]NP[facility]BC 08/01/2013 2:27 PM

## 2013-08-28 ENCOUNTER — Ambulatory Visit: Payer: Medicaid Other | Admitting: Women's Health

## 2013-08-29 ENCOUNTER — Encounter: Payer: Self-pay | Admitting: *Deleted

## 2013-09-07 ENCOUNTER — Ambulatory Visit (INDEPENDENT_AMBULATORY_CARE_PROVIDER_SITE_OTHER): Payer: Medicaid Other | Admitting: Adult Health

## 2013-09-07 ENCOUNTER — Encounter: Payer: Self-pay | Admitting: Adult Health

## 2013-09-07 VITALS — BP 142/86 | Ht 60.25 in | Wt 144.0 lb

## 2013-09-07 DIAGNOSIS — A499 Bacterial infection, unspecified: Secondary | ICD-10-CM

## 2013-09-07 DIAGNOSIS — N760 Acute vaginitis: Secondary | ICD-10-CM

## 2013-09-07 DIAGNOSIS — B9689 Other specified bacterial agents as the cause of diseases classified elsewhere: Secondary | ICD-10-CM

## 2013-09-07 DIAGNOSIS — N898 Other specified noninflammatory disorders of vagina: Secondary | ICD-10-CM | POA: Insufficient documentation

## 2013-09-07 HISTORY — DX: Other specified bacterial agents as the cause of diseases classified elsewhere: B96.89

## 2013-09-07 HISTORY — DX: Other specified noninflammatory disorders of vagina: N89.8

## 2013-09-07 LAB — POCT WET PREP (WET MOUNT): WBC WET PREP: POSITIVE

## 2013-09-07 MED ORDER — METRONIDAZOLE 500 MG PO TABS: 500.0000 mg | ORAL_TABLET | Freq: Two times a day (BID) | ORAL | Status: DC

## 2013-09-07 NOTE — Progress Notes (Signed)
Subjective:     Patient ID: Ruth Mullen, female   DOB: 1985/07/19, 28 y.o.   MRN: [id number]  HPI Ruth Mullen is a 28 year old white female in complaining of vaginal discharge and itch,had used new soap and had douched.  Review of Systems See HPI Reviewed past medical,surgical, social and family history. Reviewed medications and allergies.     Objective:   Physical Exam BP 142/86  Ht 5' 0.25" (1.53 m)  Wt 144 lb (65.318 kg)  BMI 27.90 kg/m2  Breastfeeding? No   Skin warm and dry.Pelvic: external genitalia is normal in appearance, vagina: white discharge with slight  Odor, red sidewalls, cervix:smooth and bulbous, uterus: normal size, shape and contour, non tender, no masses felt, adnexa: no masses or tenderness noted. Wet prep: + for clue cells and +WBCs. GC/CHL obtained.  Assessment:    Vaginal discharge BV    Plan:    Do not douche  Rx flagyl 500 mg 1 bid x 7 days, no alcohol, review handout on BV   GC/CHL sent Follow up prn

## 2013-09-07 NOTE — Patient Instructions (Signed)
Bacterial Vaginosis Bacterial vaginosis is a vaginal infection that occurs when the normal balance of bacteria in the vagina is disrupted. It results from an overgrowth of certain bacteria. This is the most common vaginal infection in women of childbearing age. Treatment is important to prevent complications, especially in pregnant women, as it can cause a premature delivery. CAUSES  Bacterial vaginosis is caused by an increase in harmful bacteria that are normally present in smaller amounts in the vagina. Several different kinds of bacteria can cause bacterial vaginosis. However, the reason that the condition develops is not fully understood. RISK FACTORS Certain activities or behaviors can put you at an increased risk of developing bacterial vaginosis, including:  Having a new sex partner or multiple sex partners.  Douching.  Using an intrauterine device (IUD) for contraception. Women do not get bacterial vaginosis from toilet seats, bedding, swimming pools, or contact with objects around them. SIGNS AND SYMPTOMS  Some women with bacterial vaginosis have no signs or symptoms. Common symptoms include:  Grey vaginal discharge.  A fishlike odor with discharge, especially after sexual intercourse.  Itching or burning of the vagina and vulva.  Burning or pain with urination. DIAGNOSIS  Your health care provider will take a medical history and examine the vagina for signs of bacterial vaginosis. A sample of vaginal fluid may be taken. Your health care provider will look at this sample under a microscope to check for bacteria and abnormal cells. A vaginal pH test may also be done.  TREATMENT  Bacterial vaginosis may be treated with antibiotic medicines. These may be given in the form of a pill or a vaginal cream. A second round of antibiotics may be prescribed if the condition comes back after treatment.  HOME CARE INSTRUCTIONS   Only take over-the-counter or prescription medicines as  directed by your health care provider.  If antibiotic medicine was prescribed, take it as directed. Make sure you finish it even if you start to feel better.  Do not have sex until treatment is completed.  Tell all sexual partners that you have a vaginal infection. They should see their health care provider and be treated if they have problems, such as a mild rash or itching.  Practice safe sex by using condoms and only having one sex partner. SEEK MEDICAL CARE IF:   Your symptoms are not improving after 3 days of treatment.  You have increased discharge or pain.  You have a fever. MAKE SURE YOU:   Understand these instructions.  Will watch your condition.  Will get help right away if you are not doing well or get worse. FOR MORE INFORMATION  Centers for Disease Control and Prevention, Division of STD Prevention: SolutionApps.co.zawww.cdc.gov/std American Sexual Health Association (ASHA): www.ashastd.org  Document Released: 04/13/2005 Document Revised: 02/01/2013 Document Reviewed: 11/23/2012 [facility] TheExitCare Patient Information 2014 [place], [place]. Take flagyl No sex no alcohol

## 2013-09-08 LAB — GC/CHLAMYDIA PROBE AMP
CT Probe RNA: NEGATIVE
GC Probe RNA: NEGATIVE

## 2014-01-10 ENCOUNTER — Ambulatory Visit (INDEPENDENT_AMBULATORY_CARE_PROVIDER_SITE_OTHER): Payer: Medicaid Other | Admitting: Advanced Practice Midwife

## 2014-01-11 ENCOUNTER — Ambulatory Visit: Payer: Medicaid Other | Admitting: Advanced Practice Midwife

## 2014-01-15 ENCOUNTER — Ambulatory Visit (INDEPENDENT_AMBULATORY_CARE_PROVIDER_SITE_OTHER): Payer: Medicaid Other | Admitting: Obstetrics & Gynecology

## 2014-01-15 ENCOUNTER — Encounter: Payer: Self-pay | Admitting: Obstetrics & Gynecology

## 2014-01-15 VITALS — BP 150/100 | Wt 134.0 lb

## 2014-01-15 DIAGNOSIS — N926 Irregular menstruation, unspecified: Secondary | ICD-10-CM

## 2014-01-15 DIAGNOSIS — N939 Abnormal uterine and vaginal bleeding, unspecified: Secondary | ICD-10-CM

## 2014-01-15 LAB — POCT HEMOGLOBIN: Hemoglobin: 11.6 g/dL — AB (ref 12.2–16.2)

## 2014-01-15 MED ORDER — MEGESTROL ACETATE 40 MG PO TABS: ORAL_TABLET | ORAL | Status: DC

## 2014-01-15 NOTE — Progress Notes (Signed)
Patient ID: Ruth Mullen, female   DOB: [birth year], 28 y.o.   MRN: [id number] Blood pressure 150/100, weight 134 lb (60.782 kg), last menstrual period 11/27/2013, not currently breastfeeding.  Constant bleeding on micronor since 8/3  Will switch to megestrol and come back in 6 weeks for re evluation

## 2014-01-15 NOTE — Addendum Note (Signed)
Addended by: Criss Alvine on: 01/15/2014 01:24 PM   Modules accepted: Orders

## 2014-01-17 ENCOUNTER — Telehealth: Payer: Self-pay | Admitting: Obstetrics & Gynecology

## 2014-01-17 NOTE — Telephone Encounter (Signed)
Actually I said it would take a few days for the bleeding to stop.  Keep taking the megestrol as prescribed

## 2014-01-17 NOTE — Telephone Encounter (Signed)
Pt states saw Dr. Despina Hidden on 01/15/2014 was given Megace. Pt states vaginal bleeding has worsen, c/o back pain, abdominal pain. Please advise.

## 2014-01-18 NOTE — Telephone Encounter (Signed)
Pt informed to continue taking Megace as Dr. Despina Hidden prescribed. Pt verbalized understanding.

## 2014-02-15 ENCOUNTER — Ambulatory Visit: Payer: Medicaid Other | Admitting: Advanced Practice Midwife

## 2014-02-26 ENCOUNTER — Ambulatory Visit: Payer: Medicaid Other | Admitting: Obstetrics & Gynecology

## 2014-02-26 ENCOUNTER — Encounter: Payer: Self-pay | Admitting: Obstetrics & Gynecology

## 2014-03-05 ENCOUNTER — Ambulatory Visit: Payer: Medicaid Other | Admitting: Obstetrics & Gynecology

## 2014-03-20 ENCOUNTER — Ambulatory Visit: Payer: Medicaid Other

## 2014-03-20 ENCOUNTER — Telehealth: Payer: Self-pay | Admitting: Obstetrics & Gynecology

## 2014-03-20 ENCOUNTER — Encounter: Payer: Self-pay | Admitting: Obstetrics and Gynecology

## 2014-03-20 ENCOUNTER — Telehealth: Payer: Self-pay | Admitting: *Deleted

## 2014-03-20 MED ORDER — MEDROXYPROGESTERONE ACETATE 150 MG/ML IM SUSP: 150.0000 mg | INTRAMUSCULAR | Status: DC

## 2014-03-20 NOTE — Telephone Encounter (Signed)
[place] [facility] [place] and [place] rx for Depo. Pt aware. JSY

## 2014-03-20 NOTE — Telephone Encounter (Signed)
DEPO sent to pt's pharmacy. LMOM for pt ot go by pharmacy and pick up Rx.

## 2014-03-21 ENCOUNTER — Ambulatory Visit: Payer: Medicaid Other

## 2014-03-24 ENCOUNTER — Other Ambulatory Visit: Payer: Self-pay | Admitting: Advanced Practice Midwife

## 2014-05-07 ENCOUNTER — Encounter: Payer: Self-pay | Admitting: Obstetrics & Gynecology

## 2014-05-07 ENCOUNTER — Ambulatory Visit (INDEPENDENT_AMBULATORY_CARE_PROVIDER_SITE_OTHER): Payer: Medicaid Other | Admitting: Obstetrics & Gynecology

## 2014-05-07 VITALS — BP 130/100 | Wt 118.0 lb

## 2014-05-07 DIAGNOSIS — N938 Other specified abnormal uterine and vaginal bleeding: Secondary | ICD-10-CM

## 2014-05-07 MED ORDER — MEGESTROL ACETATE 40 MG PO TABS
ORAL_TABLET | ORAL | Status: DC
Start: 2014-05-07 — End: 2014-06-28

## 2014-05-07 MED ORDER — DESOGESTREL-ETHINYL ESTRADIOL 0.15-30 MG-MCG PO TABS: 1.0000 | ORAL_TABLET | Freq: Every day | ORAL | Status: DC

## 2014-05-07 NOTE — Progress Notes (Signed)
Patient ID: Ruth HammersmithBrittaney N Mullen, female   DOB: 10-17-1985, 29 y.o.   MRN: [id number] Has long history of DUB on progesterone only BCM  As a result will give 3 weeks of megestrol to fullyu mature endometrium then withdraw then start desogestrel with 30 mics EE  Follow up 4

## 2014-06-28 ENCOUNTER — Other Ambulatory Visit: Payer: Self-pay | Admitting: Adult Health

## 2014-06-28 ENCOUNTER — Ambulatory Visit (INDEPENDENT_AMBULATORY_CARE_PROVIDER_SITE_OTHER): Payer: Medicaid Other | Admitting: Adult Health

## 2014-06-28 ENCOUNTER — Encounter: Payer: Self-pay | Admitting: Adult Health

## 2014-06-28 VITALS — BP 150/90 | HR 82 | Ht 60.25 in | Wt 121.0 lb

## 2014-06-28 DIAGNOSIS — R5383 Other fatigue: Secondary | ICD-10-CM

## 2014-06-28 DIAGNOSIS — Z3202 Encounter for pregnancy test, result negative: Secondary | ICD-10-CM

## 2014-06-28 DIAGNOSIS — N939 Abnormal uterine and vaginal bleeding, unspecified: Secondary | ICD-10-CM

## 2014-06-28 HISTORY — DX: Other fatigue: R53.83

## 2014-06-28 LAB — POCT URINE PREGNANCY: PREG TEST UR: NEGATIVE

## 2014-06-28 NOTE — Progress Notes (Signed)
Subjective:     Patient ID: Ruth HammersmithBrittaney N Mullen, female   DOB: 1985/05/07, 29 y.o.   MRN: [id number]  HPI Ruth Mullen is a 29 year old black female in complaining of continued bleeding.She had bleeding with Micronor, Nexplanon and Depo, was placed on megace and bleeding continued then was placed on Apri and still bleeding.Feels tired.She says when she is off birth control does not bleed like this.She also says she wants her tubes tied, that 2 kids enough.  Review of Systems See HPI for positives, all other systems negative  Reviewed past medical,surgical, social and family history. Reviewed medications and allergies.     Objective:   Physical Exam BP 150/90 mmHg  Pulse 82  Ht 5' 0.25" (1.53 m)  Wt 121 lb (54.885 kg)  BMI 23.45 kg/m2  Breastfeeding? No UPT negative, Skin warm and dry.Pelvic: external genitalia is normal in appearance no lesions, vagina: period like blood, no odor,urethra has no lesions or masses noted, cervix:smooth and bulbous, uterus: normal size, shape and contour, non tender, no masses felt, adnexa: no masses or tenderness noted. Bladder is non tender and no masses felt. Will check GC/CHL and  CBC today and get gyn US next week.Tubal papers signed today.    Assessment:     AUB Fatigue     Plan:    GC/CHL sent Continue Apri for now Check CBC Return in 1 week for gyn US, will talk next week when results back   Review handouts on tubal ligation and ablation

## 2014-06-28 NOTE — Patient Instructions (Signed)
Dysfunctional Uterine Bleeding Normally, menstrual periods begin between ages 11 to 17 in young women. A normal menstrual cycle/period may begin every 23 days up to 35 days and lasts from 1 to 7 days. Around 12 to 14 days before your menstrual period starts, ovulation (ovary produces an egg) occurs. When counting the time between menstrual periods, count from the first day of bleeding of the previous period to the first day of bleeding of the next period. Dysfunctional (abnormal) uterine bleeding is bleeding that is different from a normal menstrual period. Your periods may come earlier or later than usual. They may be lighter, have blood clots or be heavier. You may have bleeding between periods, or you may skip one period or more. You may have bleeding after sexual intercourse, bleeding after menopause, or no menstrual period. CAUSES   Pregnancy (normal, miscarriage, tubal).  IUDs (intrauterine device, birth control).  Birth control pills.  Hormone treatment.  Menopause.  Infection of the cervix.  Blood clotting problems.  Infection of the inside lining of the uterus.  Endometriosis, inside lining of the uterus growing in the pelvis and other female organs.  Adhesions (scar tissue) inside the uterus.  Obesity or severe weight loss.  Uterine polyps inside the uterus.  Cancer of the vagina, cervix, or uterus.  Ovarian cysts or polycystic ovary syndrome.  Medical problems (diabetes, thyroid disease).  Uterine fibroids (noncancerous tumor).  Problems with your female hormones.  Endometrial hyperplasia, very thick lining and enlarged cells inside of the uterus.  Medicines that interfere with ovulation.  Radiation to the pelvis or abdomen.  Chemotherapy. DIAGNOSIS   Your doctor will discuss the history of your menstrual periods, medicines you are taking, changes in your weight, stress in your life, and any medical problems you may have.  Your doctor will do a physical  and pelvic examination.  Your doctor may want to perform certain tests to make a diagnosis, such as:  Pap test.  Blood tests.  Cultures for infection.  CT scan.  Ultrasound.  Hysteroscopy.  Laparoscopy.  MRI.  Hysterosalpingography.  D and C.  Endometrial biopsy. TREATMENT  Treatment will depend on the cause of the dysfunctional uterine bleeding (DUB). Treatment may include:  Observing your menstrual periods for a couple of months.  Prescribing medicines for medical problems, including:  Antibiotics.  Hormones.  Birth control pills.  Removing an IUD (intrauterine device, birth control).  Surgery:  D and C (scrape and remove tissue from inside the uterus).  Laparoscopy (examine inside the abdomen with a lighted tube).  Uterine ablation (destroy lining of the uterus with electrical current, laser, heat, or freezing).  Hysteroscopy (examine cervix and uterus with a lighted tube).  Hysterectomy (remove the uterus). HOME CARE INSTRUCTIONS   If medicines were prescribed, take exactly as directed. Do not change or switch medicines without consulting your caregiver.  Long term heavy bleeding may result in iron deficiency. Your caregiver may have prescribed iron pills. They help replace the iron that your body lost from heavy bleeding. Take exactly as directed.  Do not take aspirin or medicines that contain aspirin one week before or during your menstrual period. Aspirin may make the bleeding worse.  If you need to change your sanitary pad or tampon more than once every 2 hours, stay in bed with your feet elevated and a cold pack on your lower abdomen. Rest as much as possible, until the bleeding stops or slows down.  Eat well-balanced meals. Eat foods high in iron. Examples   are:  Leafy green vegetables.  Whole-grain breads and cereals.  Eggs.  Meat.  Liver.  Do not try to lose weight until the abnormal bleeding has stopped and your blood iron level is  back to normal. Do not lift more than ten pounds or do strenuous activities when you are bleeding.  For a couple of months, make note on your calendar, marking the start and ending of your period, and the type of bleeding (light, medium, heavy, spotting, clots or missed periods). This is for your caregiver to better evaluate your problem. SEEK MEDICAL CARE IF:   You develop nausea (feeling sick to your stomach) and vomiting, dizziness, or diarrhea while you are taking your medicine.  You are getting lightheaded or weak.  You have any problems that may be related to the medicine you are taking.  You develop pain with your DUB.  You want to remove your IUD.  You want to stop or change your birth control pills or hormones.  You have any type of abnormal bleeding mentioned above.  You are over 27 years old and have not had a menstrual period yet.  You are 29 years old and you are still having menstrual periods.  You have any of the symptoms mentioned above.  You develop a rash. SEEK IMMEDIATE MEDICAL CARE IF:   An oral temperature above 102 F (38.9 C) develops.  You develop chills.  You are changing your sanitary pad or tampon more than once an hour.  You develop abdominal pain.  You pass out or faint. Document Released: 04/10/2000 Document Revised: 07/06/2011 Document Reviewed: 03/12/2009 [facility] Patient Information 2015 [employer] [place]. This information is not intended to replace advice given to you by your health care provider. Make sure you discuss any questions you have with your health care provider. Keep taking OCs Return in 1 week for US  Will talk  Next week

## 2014-06-29 ENCOUNTER — Telehealth: Payer: Self-pay | Admitting: Adult Health

## 2014-06-29 LAB — CBC
HEMATOCRIT: 41.6 % (ref 34.0–46.6)
HEMOGLOBIN: 13.7 g/dL (ref 11.1–15.9)
MCH: 25.8 pg — ABNORMAL LOW (ref 26.6–33.0)
MCHC: 32.9 g/dL (ref 31.5–35.7)
MCV: 78 fL — ABNORMAL LOW (ref 79–97)
Platelets: 422 10*3/uL — ABNORMAL HIGH (ref 150–379)
RBC: 5.31 x10E6/uL — AB (ref 3.77–5.28)
RDW: 14.9 % (ref 12.3–15.4)
WBC: 6.1 10*3/uL (ref 3.4–10.8)

## 2014-06-29 NOTE — Telephone Encounter (Signed)
Pt aware of labs  

## 2014-06-30 LAB — GC/CHLAMYDIA PROBE AMP
Chlamydia trachomatis, NAA: NEGATIVE
Neisseria gonorrhoeae by PCR: NEGATIVE

## 2014-07-03 ENCOUNTER — Emergency Department (HOSPITAL_COMMUNITY)
Admission: EM | Admit: 2014-07-03 | Discharge: 2014-07-03 | Disposition: A | Discharge status: Home / Self Care | Payer: MEDICAID | Attending: Emergency Medicine | Admitting: Emergency Medicine

## 2014-07-03 ENCOUNTER — Encounter (HOSPITAL_COMMUNITY): Payer: Self-pay

## 2014-07-03 DIAGNOSIS — Z79899 Other long term (current) drug therapy: Secondary | ICD-10-CM | POA: Diagnosis not present

## 2014-07-03 DIAGNOSIS — Z8619 Personal history of other infectious and parasitic diseases: Secondary | ICD-10-CM | POA: Diagnosis not present

## 2014-07-03 DIAGNOSIS — F4321 Adjustment disorder with depressed mood: Secondary | ICD-10-CM

## 2014-07-03 DIAGNOSIS — I10 Essential (primary) hypertension: Secondary | ICD-10-CM | POA: Diagnosis not present

## 2014-07-03 DIAGNOSIS — Z8742 Personal history of other diseases of the female genital tract: Secondary | ICD-10-CM | POA: Diagnosis not present

## 2014-07-03 DIAGNOSIS — F4320 Adjustment disorder, unspecified: Secondary | ICD-10-CM | POA: Insufficient documentation

## 2014-07-03 DIAGNOSIS — Z720 Tobacco use: Secondary | ICD-10-CM | POA: Insufficient documentation

## 2014-07-03 DIAGNOSIS — F410 Panic disorder [episodic paroxysmal anxiety] without agoraphobia: Secondary | ICD-10-CM | POA: Diagnosis present

## 2014-07-03 HISTORY — DX: Generalized anxiety disorder: F41.1

## 2014-07-03 HISTORY — DX: Acute stress reaction: F43.0
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
## 2014-07-03 NOTE — BH Assessment (Signed)
Patient reports that she is now able to contract for safety and is able to sign a no harm contract and will be able to follow up outpatient resources.  Per Renata Capriceonrad, NP - patient can be discharged with outpatient resources and patient will sign a no harm contract.

## 2014-07-03 NOTE — Discharge Instructions (Signed)
Grief Reaction Grief is a normal response to the death of someone close to you. Feelings of fear, anger, and guilt can affect almost everyone who loses someone they love. Symptoms of depression are also common. These include problems with sleep, loss of appetite, and lack of energy. These grief reaction symptoms often last for weeks to months after a loss. They may also return during special times that remind you of the person you lost, such as an anniversary or birthday. Anxiety, insomnia, irritability, and deep depression may last beyond the period of normal grief. If you experience these feelings for 6 months or longer, you may have clinical depression. Clinical depression requires further medical attention. If you think that you have clinical depression, you should contact your caregiver. If you have a history of depression or a family history of depression, you are at greater risk of clinical depression. You are also at greater risk of developing clinical depression if the loss was traumatic or the loss was of someone with whom you had unresolved issues.  A grief reaction can become complicated by being blocked. This means being unable to cry or express extreme emotions. This may prolong the grieving period and worsen the emotional effects of the loss. Mourning is a natural event in human life. A healthy grief reaction is one that is not blocked. It requires a time of sadness and readjustment. It is very important to share your sorrow and fear with others, especially close friends and family. Professional counselors and clergy can also help you process your grief. Document Released: 04/13/2005 Document Revised: 08/28/2013 Document Reviewed: 12/22/2005 Exit[employer] Patient Information 2015 [employer]. This information is not intended to replace advice given to you by your health care provider. Make sure you discuss any questions you have with your health care provider.  

## 2014-07-03 NOTE — ED Notes (Signed)
Patient states she has been under an increased amount of stress for a while. States she recently lost her grandmother, and her boyfriend passed away unexpectedly this past Saturday. [facility] after her son called 911 for her due to having a panic attack. Patient states she has had panic attacks before. Patient had tachypnea upon EMS arrival to home. Patient states she has not had her HTN med in 1 week.

## 2014-07-03 NOTE — BH Assessment (Signed)
Writer informed of the TTS consult.

## 2014-07-03 NOTE — ED Notes (Signed)
Pt refused repeat vs upon d/c stating she just wanted to get out of here since she had to go to funeral home.

## 2014-07-04 ENCOUNTER — Other Ambulatory Visit: Payer: Medicaid Other

## 2014-07-09 ENCOUNTER — Encounter: Payer: Self-pay | Admitting: Obstetrics & Gynecology

## 2014-07-09 ENCOUNTER — Other Ambulatory Visit: Payer: Medicaid Other

## 2014-07-30 IMAGING — US US OB COMP LESS 14 WK
1 series · 14 of 28 positions shown · non-contrast
Comparison: None.

CLINICAL DATA: Vaginal bleeding.

OBSTETRIC <14 WK US AND TRANSVAGINAL OB US
TECHNIQUE: Both transabdominal and transvaginal ultrasound
examinations were performed for complete evaluation of the
gestation as well as the maternal uterus, adnexal regions, and
pelvic cul-de-sac.  Transvaginal technique was performed to assess
early pregnancy.

[Series 1: us ob comp less 14 wks · 14 of 43 slices shown]
[im 2/43]
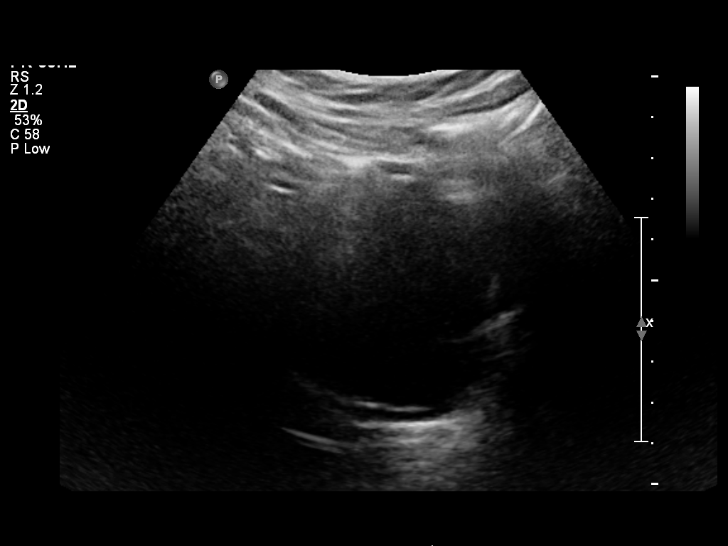
[im 5/43]
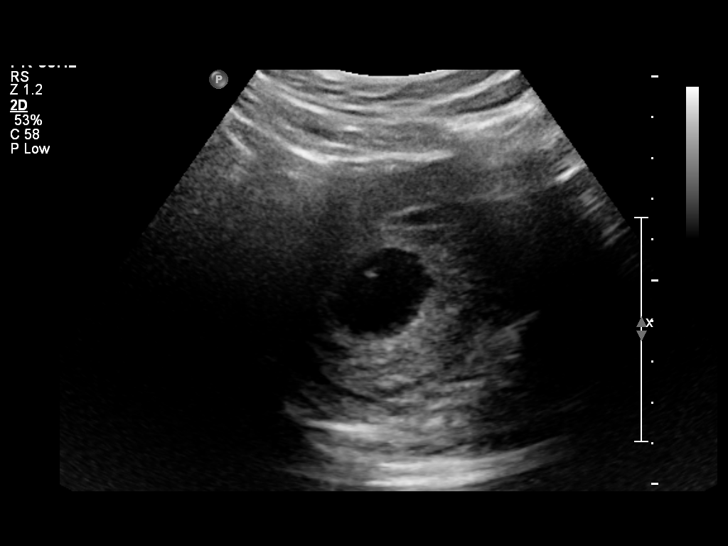
[im 8/43]
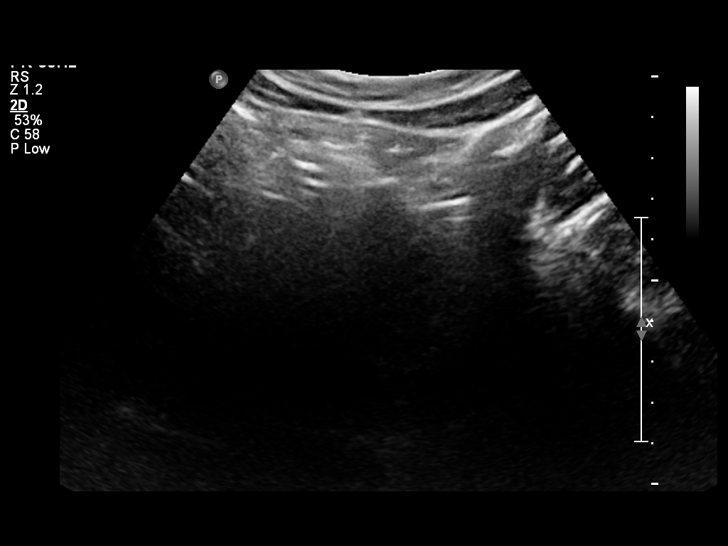
[im 11/43]
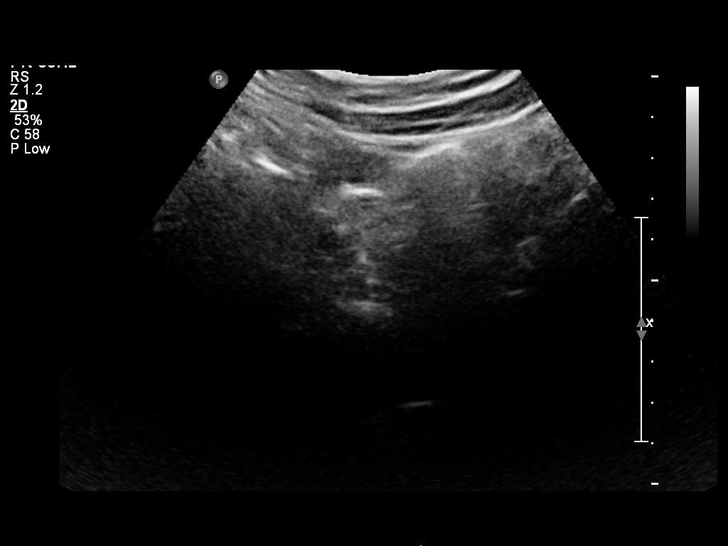
[im 15/43]
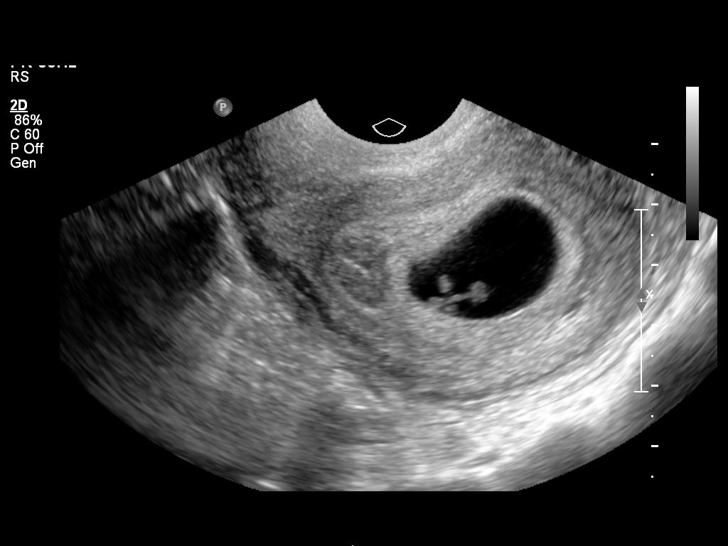
[im 18/43]
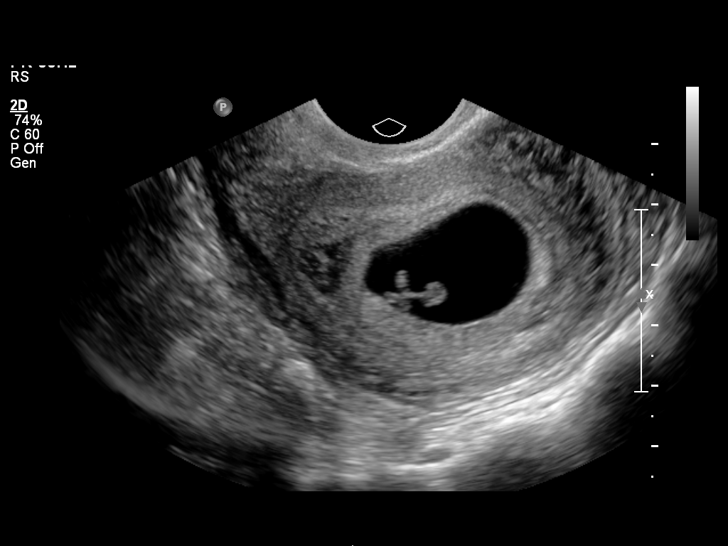
[im 21/43]
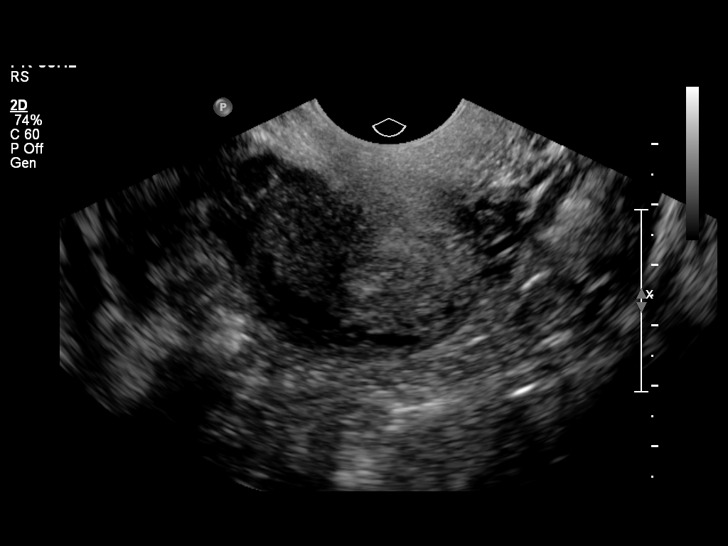
[im 24/43]
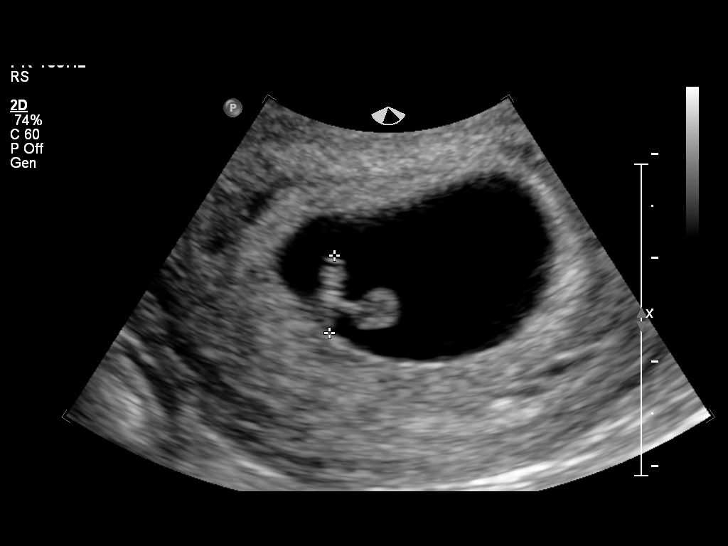
[im 27/43]
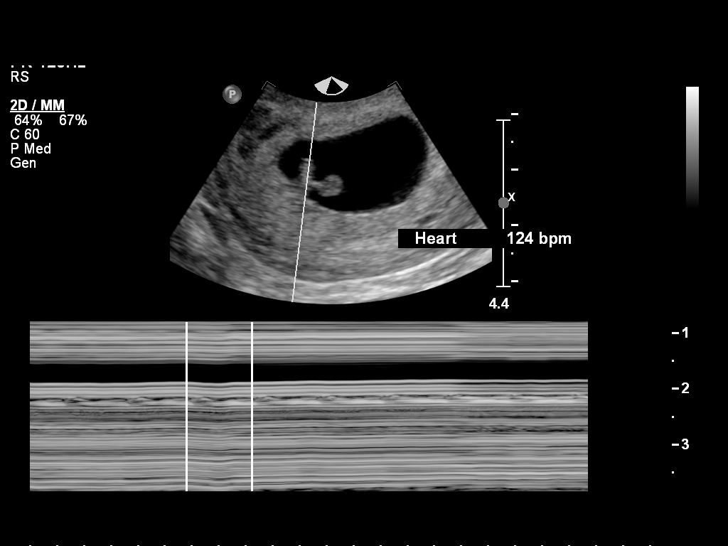
[im 30/43]
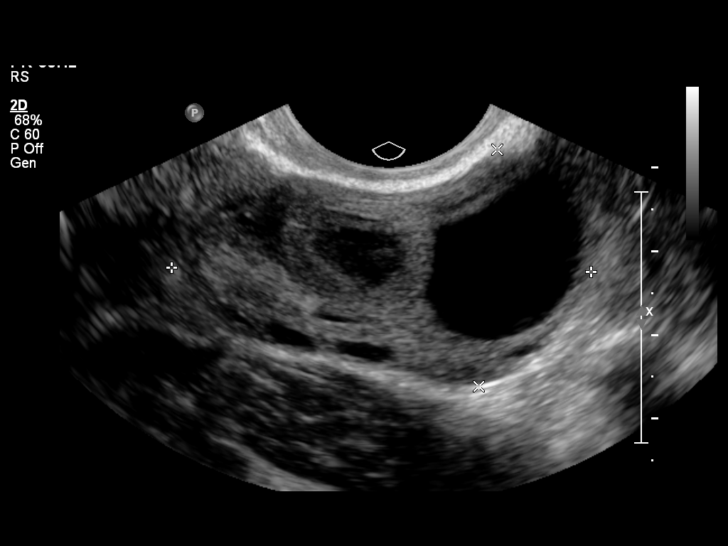
[im 33/43]
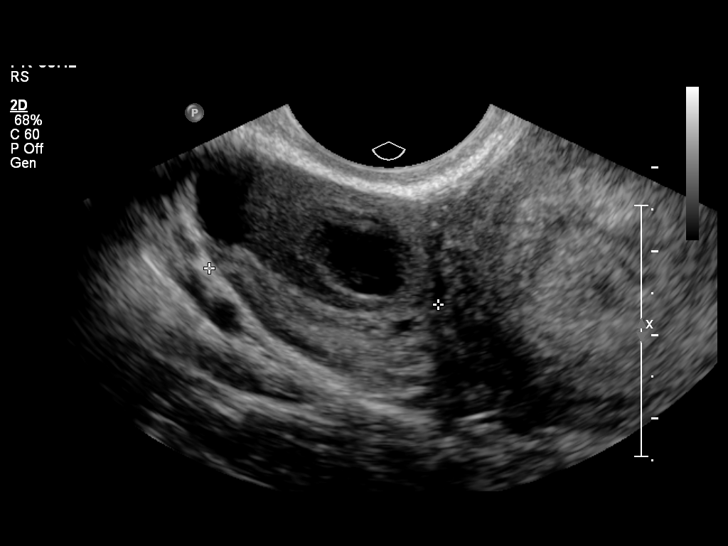
[im 36/43]
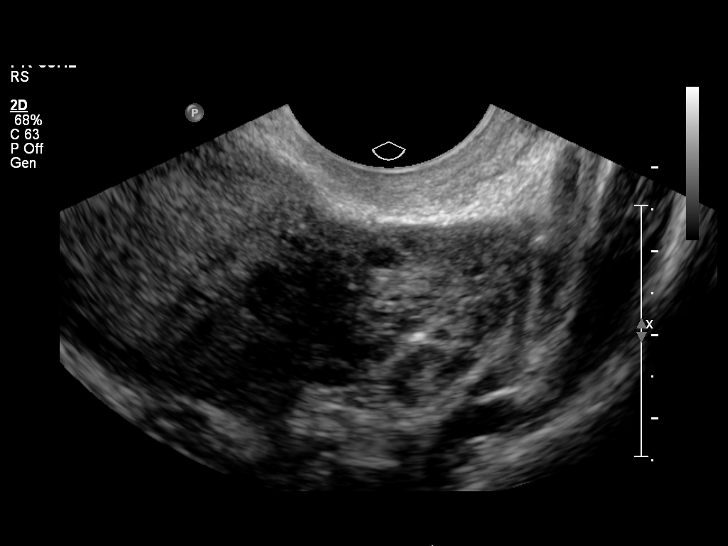
[im 39/43]
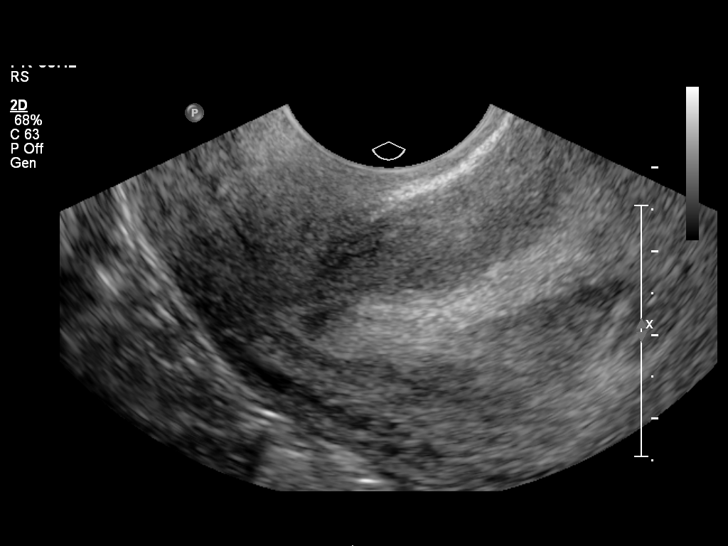
[im 43/43]
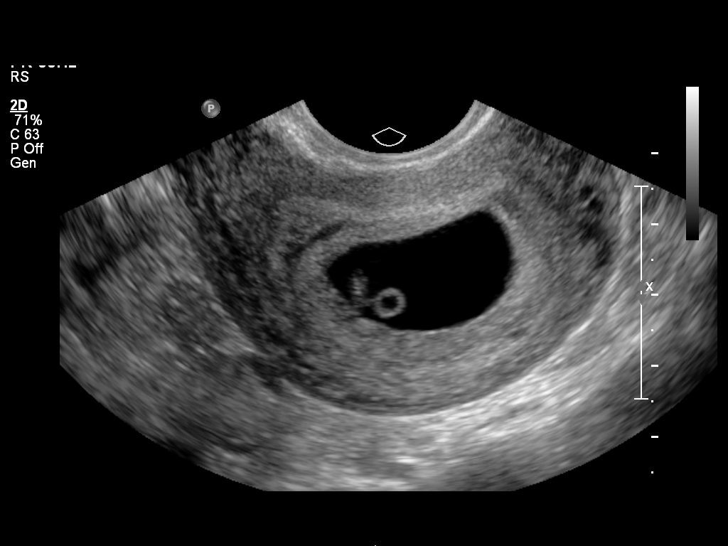

[14 of 28 positions shown; findings below may reference images not displayed]

Intrauterine gestational sac:  Visualized/normal in shape.
Yolk sac: Present
Embryo: Present
Cardiac Activity: Present
Heart Rate: 124 bpm

CRL: 7.1  mm  6 w  5 d        US EDC: 06/14/2013

Maternal uterus/adnexae:
Small subchorionic hemorrhage.  Physiologic appearance of the
ovaries.
IMPRESSION: Single intrauterine pregnancy with small subchorionic hemorrhage.

## 2014-09-05 ENCOUNTER — Ambulatory Visit: Payer: Medicaid Other | Admitting: Obstetrics & Gynecology

## 2014-09-25 ENCOUNTER — Emergency Department (HOSPITAL_COMMUNITY)
Admission: EM | Admit: 2014-09-25 | Discharge: 2014-09-25 | Disposition: A | Discharge status: Home / Self Care | Payer: Medicaid Other | Attending: Emergency Medicine | Admitting: Emergency Medicine

## 2014-09-25 ENCOUNTER — Encounter (HOSPITAL_COMMUNITY): Payer: Self-pay | Admitting: Emergency Medicine

## 2014-09-25 DIAGNOSIS — Z87891 Personal history of nicotine dependence: Secondary | ICD-10-CM | POA: Diagnosis not present

## 2014-09-25 DIAGNOSIS — Z87448 Personal history of other diseases of urinary system: Secondary | ICD-10-CM | POA: Insufficient documentation

## 2014-09-25 DIAGNOSIS — I10 Essential (primary) hypertension: Secondary | ICD-10-CM | POA: Diagnosis not present

## 2014-09-25 DIAGNOSIS — H578 Other specified disorders of eye and adnexa: Secondary | ICD-10-CM | POA: Diagnosis not present

## 2014-09-25 DIAGNOSIS — Z79899 Other long term (current) drug therapy: Secondary | ICD-10-CM | POA: Insufficient documentation

## 2014-09-25 DIAGNOSIS — H10021 Other mucopurulent conjunctivitis, right eye: Secondary | ICD-10-CM

## 2014-09-25 DIAGNOSIS — Z8659 Personal history of other mental and behavioral disorders: Secondary | ICD-10-CM | POA: Insufficient documentation

## 2014-09-25 DIAGNOSIS — J029 Acute pharyngitis, unspecified: Secondary | ICD-10-CM | POA: Diagnosis not present

## 2014-09-25 MED ORDER — HYDROCODONE-ACETAMINOPHEN 7.5-325 MG/15ML PO SOLN: 10.0000 mL | ORAL | Status: DC | PRN

## 2014-09-25 MED ORDER — PENICILLIN G BENZATHINE 1200000 UNIT/2ML IM SUSP
1.2000 10*6.[IU] | Freq: Once | INTRAMUSCULAR | Status: AC
  Administered 2014-09-25: 1.2 10*6.[IU] via INTRAMUSCULAR
  Filled 2014-09-25: qty 2

## 2014-09-25 MED ORDER — IBUPROFEN 400 MG PO TABS
600.0000 mg | ORAL_TABLET | Freq: Once | ORAL | Status: AC
  Administered 2014-09-25: 600 mg via ORAL
  Filled 2014-09-25: qty 2

## 2014-09-25 NOTE — Discharge Instructions (Signed)
Viral Conjunctivitis Conjunctivitis is an irritation (inflammation) of the clear membrane that covers the white part of the eye (the conjunctiva). The irritation can also happen on the underside of the eyelids. Conjunctivitis makes the eye red or pink in color. This is what is commonly known as pink eye. Viral conjunctivitis can spread easily (contagious). CAUSES   Infection from virus on the surface of the eye.  Infection from the irritation or injury of nearby tissues such as the eyelids or cornea.  More serious inflammation or infection on the inside of the eye.  Other eye diseases.  The use of certain eye medications. SYMPTOMS  The normally white color of the eye or the underside of the eyelid is usually pink or red in color. The pink eye is usually associated with irritation, tearing and some sensitivity to light. Viral conjunctivitis is often associated with a clear, watery discharge. If a discharge is present, there may also be some blurred vision in the affected eye. DIAGNOSIS  Conjunctivitis is diagnosed by an eye exam. The eye specialist looks for changes in the surface tissues of the eye which take on changes characteristic of the specific types of conjunctivitis. A sample of any discharge may be collected on a Q-Tip (sterile swap). The sample will be sent to a lab to see whether or not the inflammation is caused by bacterial or viral infection. TREATMENT  Viral conjunctivitis will not respond to medicines that kill germs (antibiotics). Treatment is aimed at stopping a bacterial infection on top of the viral infection. The goal of treatment is to relieve symptoms (such as itching) with antihistamine drops or other eye medications.  HOME CARE INSTRUCTIONS   To ease discomfort, apply a cool, clean wash cloth to your eye for 10 to 20 minutes, 3 to 4 times a day.  Gently wipe away any drainage from the eye with a warm, wet washcloth or a cotton ball.  Wash your hands often with soap  and use paper towels to dry.  Do not share towels or washcloths. This may spread the infection.  Change or wash your pillowcase every day.  You should not use eye make-up until the infection is gone.  Stop using contacts lenses. Ask your eye professional how to sterilize or replace them before using again. This depends on the type of contact lenses used.  Do not touch the edge of the eyelid with the eye drop bottle or ointment tube when applying medications to the affected eye. This will stop you from spreading the infection to the other eye or to others. SEEK IMMEDIATE MEDICAL CARE IF:   The infection has not improved within 3 days of beginning treatment.  A watery discharge from the eye develops.  Pain in the eye increases.  The redness is spreading.  Vision becomes blurred.  An oral temperature above 102 F (38.9 C) develops, or as your caregiver suggests.  Facial pain, redness or swelling develops.  Any problems that may be related to the prescribed medicine develop. MAKE SURE YOU:   Understand these instructions.  Will watch your condition.  Will get help right away if you are not doing well or get worse. Document Released: 04/13/2005 Document Revised: 07/06/2011 Document Reviewed: 12/01/2007 [facility] Patient Information 2015 [place], [place]. This information is not intended to replace advice given to you by your health care provider. Make sure you discuss any questions you have with your health care provider.  Strep Throat Strep throat is an infection of the throat caused  by a bacteria named Streptococcus pyogenes. Your health care provider may call the infection streptococcal "tonsillitis" or "pharyngitis" depending on whether there are signs of inflammation in the tonsils or back of the throat. Strep throat is most common in children aged 5-15 years during the cold months of the year, but it can occur in people of any age during any season. This infection is spread  from person to person (contagious) through coughing, sneezing, or other close contact. SIGNS AND SYMPTOMS   Fever or chills.  Painful, swollen, red tonsils or throat.  Pain or difficulty when swallowing.  White or yellow spots on the tonsils or throat.  Swollen, tender lymph nodes or "glands" of the neck or under the jaw.  Red rash all over the body (rare). DIAGNOSIS  Many different infections can cause the same symptoms. A test must be done to confirm the diagnosis so the right treatment can be given. A "rapid strep test" can help your health care provider make the diagnosis in a few minutes. If this test is not available, a light swab of the infected area can be used for a throat culture test. If a throat culture test is done, results are usually available in a day or two. TREATMENT  Strep throat is treated with antibiotic medicine. HOME CARE INSTRUCTIONS   Gargle with 1 tsp of salt in 1 cup of warm water, 3-4 times per day or as needed for comfort.  Family members who also have a sore throat or fever should be tested for strep throat and treated with antibiotics if they have the strep infection.  Make sure everyone in your household washes their hands well.  Do not share food, drinking cups, or personal items that could cause the infection to spread to others.  You may need to eat a soft food diet until your sore throat gets better.  Drink enough water and fluids to keep your urine clear or pale yellow. This will help prevent dehydration.  Get plenty of rest.  Stay home from school, day care, or work until you have been on antibiotics for 24 hours.  Take medicines only as directed by your health care provider.  Take your antibiotic medicine as directed by your health care provider. Finish it even if you start to feel better. SEEK MEDICAL CARE IF:   The glands in your neck continue to enlarge.  You develop a rash, cough, or earache.  You cough up green, yellow-brown, or  bloody sputum.  You have pain or discomfort not controlled by medicines.  Your problems seem to be getting worse rather than better.  You have a fever. SEEK IMMEDIATE MEDICAL CARE IF:   You develop any new symptoms such as vomiting, severe headache, stiff or painful neck, chest pain, shortness of breath, or trouble swallowing.  You develop severe throat pain, drooling, or changes in your voice.  You develop swelling of the neck, or the skin on the neck becomes red and tender.  You develop signs of dehydration, such as fatigue, dry mouth, and decreased urination.  You become increasingly sleepy, or you cannot wake up completely. MAKE SURE YOU:  Understand these instructions.  Will watch your condition.  Will get help right away if you are not doing well or get worse. Document Released: 04/10/2000 Document Revised: 08/28/2013 Document Reviewed: 06/12/2010 [facility] Patient Information 2015 [place]. This information is not intended to replace advice given to you by your health care provider. Make sure you discuss any questions  you have with your health care provider.

## 2014-09-25 NOTE — ED Notes (Signed)
Pt states that she has had a bad sore throat since Friday that has progressively gotten worse and this morning woke up with right eye red and itching.

## 2014-09-25 NOTE — ED Provider Notes (Signed)
CSN: [id number]     Arrival date & time 09/25/14  0845 History  This chart was scribed for Raeford Razor, MD by Tanda Rockers, ED Scribe. This patient was seen in room APA07/APA07 and the patient's care was started at 9:00 AM.    Chief Complaint  Patient presents with  . Sore Throat  . Eye Drainage   The history is provided by the patient. No language interpreter was used.     HPI Comments: Ruth Mullen is a 29 y.o. female who presents to the Emergency Department complaining of right eye redness that began this morning upon waking up. Pt reports that she has also had a sore throat x 4 days. She describes it as a burning sensation. She also complains of subjective fever and chills. Her temperature in the ED is 97.7. Pt has been using cough drops and taking Tylenol Cold & Flu without relief. She notes that her daughter has been coughing for the last week and had pink eye recently. Pt denies cough or any other associated symptoms at this time.    Past Medical History  Diagnosis Date  . 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 Chlamydia   . Hypertension   . Vaginal discharge 09/07/2013  . BV (bacterial vaginosis) 09/07/2013  . Fatigue 06/28/2014  . Anxiety as acute reaction to exceptional stress    Past Surgical History  Procedure Laterality Date  . Dilation and curettage of uterus    . Cholecystectomy    . Cervical cerclage N/A 02/08/2013    Procedure: CERCLAGE CERVICAL MCDONALD;  Surgeon: Tilda Burrow, MD;  Location: [facility];  Service: Gynecology;  Laterality: N/A;   Family History  Problem Relation Age of Onset  . Hypertension Mother    History  Substance Use Topics  . Smoking status: Former Smoker -- 0.00 packs/day for 1 years    Types: Cigarettes    Quit date: 09/21/2014  . Smokeless tobacco: Never Used  . Alcohol Use: No   OB History    Gravida Para Term Preterm AB TAB SAB Ectopic Multiple Living   Review of Systems  All other systems reviewed  and are negative.     Allergies  Review of patient's allergies indicates no known allergies.  Home Medications   Prior to Admission medications   Medication Sig Start Date End Date Taking? Authorizing Provider  acyclovir (ZOVIRAX) 400 MG tablet TAKE 1 TABLET BY MOUTH THREE TIMES DAILY Patient taking differently: prn 03/28/14   Jacklyn Shell, CNM  amLODipine (NORVASC) 5 MG tablet Take 1 tablet (5 mg total) by mouth daily. 07/18/13   Cheral Marker, CNM  desogestrel-ethinyl estradiol (APRI,EMOQUETTE,SOLIA) 0.15-30 MG-MCG tablet Take 1 tablet by mouth daily. Patient not taking: Reported on 07/03/2014 05/07/14   Lazaro Arms, MD  omeprazole (PRILOSEC) 20 MG capsule Take 1 capsule (20 mg total) by mouth daily. 1 tablet a day Patient not taking: Reported on 07/03/2014 06/14/13   Lazaro Arms, MD   Triage Vitals: BP 117/94 mmHg  Pulse 76  Temp(Src) 97.7 F (36.5 C) (Oral)  Resp 20  Ht 5' 0.25" (1.53 m)  Wt 110 lb (49.896 kg)  BMI 21.31 kg/m2  SpO2 100%  LMP 09/19/2014   Physical Exam  Constitutional: She is oriented to person, place, and time. She appears well-developed and well-nourished. No distress.  HENT:  Head: Normocephalic and atraumatic.  Mouth/Throat: Oropharyngeal  exudate present.  Eyes: EOM are normal.  Right eye - with conjunctival injection; no drainage.   Neck: Neck supple. No tracheal deviation present.  SIngle right sided cervical node.  Cardiovascular: Normal rate.   Pulmonary/Chest: Effort normal. No respiratory distress.  Musculoskeletal: Normal range of motion.  Neurological: She is alert and oriented to person, place, and time.  Skin: Skin is warm and dry.  Psychiatric: She has a normal mood and affect. Her behavior is normal.  Nursing note and vitals reviewed.   ED Course  Procedures (including critical care time)  DIAGNOSTIC STUDIES: Oxygen Saturation is 100% on RA, normal by my interpretation.    COORDINATION OF CARE: 9:07 AM-Discussed  treatment plan which includes antibiotic injection with pt at bedside and pt agreed to plan.    Labs Review Labs Reviewed - No data to display  Imaging Review No results found.   EKG Interpretation None      MDM   Final diagnoses:  Exudative pharyngitis  Pink eye, right    I personally preformed the services scribed in my presence. The recorded information has been reviewed is accurate. Raeford RazorStephen Joe Gee, MD.     Raeford RazorStephen Jaxxen Voong, MD 10/04/14 [phone]

## 2014-11-30 ENCOUNTER — Other Ambulatory Visit: Payer: Self-pay | Admitting: Women's Health

## 2014-11-30 ENCOUNTER — Telehealth: Payer: Self-pay | Admitting: *Deleted

## 2014-11-30 ENCOUNTER — Other Ambulatory Visit: Payer: Self-pay | Admitting: Obstetrics & Gynecology

## 2014-11-30 DIAGNOSIS — N939 Abnormal uterine and vaginal bleeding, unspecified: Secondary | ICD-10-CM

## 2014-11-30 MED ORDER — MEGESTROL ACETATE 40 MG PO TABS: 40.0000 mg | ORAL_TABLET | Freq: Three times a day (TID) | ORAL | Status: DC

## 2014-11-30 NOTE — Telephone Encounter (Signed)
Megace e-scribed to pharmacy. Pt has appt next wk.

## 2014-11-30 NOTE — Telephone Encounter (Signed)
Spoke with pt. Pt started her period 3 weeks ago. She is having back pain and nausea. She has been on Megace in the past and is requesting it again. She has scheduled an appt Thursday to see JAG. Thanks!! JSY

## 2014-12-03 ENCOUNTER — Other Ambulatory Visit: Payer: Self-pay | Admitting: *Deleted

## 2014-12-04 MED ORDER — AMLODIPINE BESYLATE 5 MG PO TABS: 5.0000 mg | ORAL_TABLET | Freq: Every day | ORAL | Status: DC

## 2014-12-06 ENCOUNTER — Ambulatory Visit (INDEPENDENT_AMBULATORY_CARE_PROVIDER_SITE_OTHER): Payer: Medicaid Other | Admitting: Adult Health

## 2014-12-06 ENCOUNTER — Encounter: Payer: Self-pay | Admitting: Adult Health

## 2014-12-06 VITALS — BP 130/80 | HR 96 | Ht 60.0 in | Wt 106.0 lb

## 2014-12-06 DIAGNOSIS — F329 Major depressive disorder, single episode, unspecified: Secondary | ICD-10-CM | POA: Insufficient documentation

## 2014-12-06 DIAGNOSIS — N939 Abnormal uterine and vaginal bleeding, unspecified: Secondary | ICD-10-CM | POA: Diagnosis not present

## 2014-12-06 DIAGNOSIS — F32A Depression, unspecified: Secondary | ICD-10-CM

## 2014-12-06 DIAGNOSIS — Z3202 Encounter for pregnancy test, result negative: Secondary | ICD-10-CM

## 2014-12-06 HISTORY — DX: Depression, unspecified: F32.A

## 2014-12-06 LAB — POCT URINE PREGNANCY: Preg Test, Ur: NEGATIVE

## 2014-12-06 MED ORDER — SERTRALINE HCL 50 MG PO TABS: 50.0000 mg | ORAL_TABLET | Freq: Every day | ORAL | Status: DC

## 2014-12-06 NOTE — Patient Instructions (Signed)
Levonorgestrel intrauterine device (IUD) What is this medicine? LEVONORGESTREL IUD (LEE voe nor jes trel) is a contraceptive (birth control) device. The device is placed inside the uterus by a healthcare professional. It is used to prevent pregnancy and can also be used to treat heavy bleeding that occurs during your period. Depending on the device, it can be used for 3 to 5 years. This medicine may be used for other purposes; ask your health care provider or pharmacist if you have questions. COMMON BRAND NAME(S): LILETTA, Mirena, Skyla What should I tell my health care provider before I take this medicine? They need to know if you have any of these conditions: -abnormal Pap smear -cancer of the breast, uterus, or cervix -diabetes -endometritis -genital or pelvic infection now or in the past -have more than one sexual partner or your partner has more than one partner -heart disease -history of an ectopic or tubal pregnancy -immune system problems -IUD in place -liver disease or tumor -problems with blood clots or take blood-thinners -use intravenous drugs -uterus of unusual shape -vaginal bleeding that has not been explained -an unusual or allergic reaction to levonorgestrel, other hormones, silicone, or polyethylene, medicines, foods, dyes, or preservatives -pregnant or trying to get pregnant -breast-feeding How should I use this medicine? This device is placed inside the uterus by a health care professional. Talk to your pediatrician regarding the use of this medicine in children. Special care may be needed. Overdosage: If you think you have taken too much of this medicine contact a poison control center or emergency room at once. NOTE: This medicine is only for you. Do not share this medicine with others. What if I miss a dose? This does not apply. What may interact with this medicine? Do not take this medicine with any of the following  medications: -amprenavir -bosentan -fosamprenavir This medicine may also interact with the following medications: -aprepitant -barbiturate medicines for inducing sleep or treating seizures -bexarotene -griseofulvin -medicines to treat seizures like carbamazepine, ethotoin, felbamate, oxcarbazepine, phenytoin, topiramate -modafinil -pioglitazone -rifabutin -rifampin -rifapentine -some medicines to treat HIV infection like atazanavir, indinavir, lopinavir, nelfinavir, tipranavir, ritonavir -St. John's wort -warfarin This list may not describe all possible interactions. Give your health care provider a list of all the medicines, herbs, non-prescription drugs, or dietary supplements you use. Also tell them if you smoke, drink alcohol, or use illegal drugs. Some items may interact with your medicine. What should I watch for while using this medicine? Visit your doctor or health care professional for regular check ups. See your doctor if you or your partner has sexual contact with others, becomes HIV positive, or gets a sexual transmitted disease. This product does not protect you against HIV infection (AIDS) or other sexually transmitted diseases. You can check the placement of the IUD yourself by reaching up to the top of your vagina with clean fingers to feel the threads. Do not pull on the threads. It is a good habit to check placement after each menstrual period. Call your doctor right away if you feel more of the IUD than just the threads or if you cannot feel the threads at all. The IUD may come out by itself. You may become pregnant if the device comes out. If you notice that the IUD has come out use a backup birth control method like condoms and call your health care provider. Using tampons will not change the position of the IUD and are okay to use during your period. What side effects may   I notice from receiving this medicine? Side effects that you should report to your doctor or  health care professional as soon as possible: -allergic reactions like skin rash, itching or hives, swelling of the face, lips, or tongue -fever, flu-like symptoms -genital sores -high blood pressure -no menstrual period for 6 weeks during use -pain, swelling, warmth in the leg -pelvic pain or tenderness -severe or sudden headache -signs of pregnancy -stomach cramping -sudden shortness of breath -trouble with balance, talking, or walking -unusual vaginal bleeding, discharge -yellowing of the eyes or skin Side effects that usually do not require medical attention (report to your doctor or health care professional if they continue or are bothersome): -acne -breast pain -change in sex drive or performance -changes in weight -cramping, dizziness, or faintness while the device is being inserted -headache -irregular menstrual bleeding within first 3 to 6 months of use -nausea This list may not describe all possible side effects. Call your doctor for medical advice about side effects. You may report side effects to FDA at 1-800-FDA-1088. Where should I keep my medicine? This does not apply. NOTE: This sheet is a summary. It may not cover all possible information. If you have questions about this medicine, talk to your doctor, pharmacist, or health care provider.  2015, Elsevier/Gold Standard. (2011-05-14 13:54:04) Depression Depression refers to feeling sad, low, down in the dumps, blue, gloomy, or empty. In general, there are two kinds of depression: 1. Normal sadness or normal grief. This kind of depression is one that we all feel from time to time after upsetting life experiences, such as the loss of a job or the ending of a relationship. This kind of depression is considered normal, is short lived, and resolves within a few days to 2 weeks. Depression experienced after the loss of a loved one (bereavement) often lasts longer than 2 weeks but normally gets better with time. 2. Clinical  depression. This kind of depression lasts longer than normal sadness or normal grief or interferes with your ability to function at home, at work, and in school. It also interferes with your personal relationships. It affects almost every aspect of your life. Clinical depression is an illness. Symptoms of depression can also be caused by conditions other than those mentioned above, such as:  Physical illness. Some physical illnesses, including underactive thyroid gland (hypothyroidism), severe anemia, specific types of cancer, diabetes, uncontrolled seizures, heart and lung problems, strokes, and chronic pain are commonly associated with symptoms of depression.  Side effects of some prescription medicine. In some people, certain types of medicine can cause symptoms of depression.  Substance abuse. Abuse of alcohol and illicit drugs can cause symptoms of depression. SYMPTOMS Symptoms of normal sadness and normal grief include the following:  Feeling sad or crying for short periods of time.  Not caring about anything (apathy).  Difficulty sleeping or sleeping too much.  No longer able to enjoy the things you used to enjoy.  Desire to be by oneself all the time (social isolation).  Lack of energy or motivation.  Difficulty concentrating or remembering.  Change in appetite or weight.  Restlessness or agitation. Symptoms of clinical depression include the same symptoms of normal sadness or normal grief and also the following symptoms:  Feeling sad or crying all the time.  Feelings of guilt or worthlessness.  Feelings of hopelessness or helplessness.  Thoughts of suicide or the desire to harm yourself (suicidal ideation).  Loss of touch with reality (psychotic symptoms). Seeing or hearing things  that are not real (hallucinations) or having false beliefs about your life or the people around you (delusions and paranoia). DIAGNOSIS  The diagnosis of clinical depression is usually based  on how bad the symptoms are and how long they have lasted. Your health care provider will also ask you questions about your medical history and substance use to find out if physical illness, use of prescription medicine, or substance abuse is causing your depression. Your health care provider may also order blood tests. TREATMENT  Often, normal sadness and normal grief do not require treatment. However, sometimes antidepressant medicine is given for bereavement to ease the depressive symptoms until they resolve. The treatment for clinical depression depends on how bad the symptoms are but often includes antidepressant medicine, counseling with a mental health professional, or both. Your health care provider will help to determine what treatment is best for you. Depression caused by physical illness usually goes away with appropriate medical treatment of the illness. If prescription medicine is causing depression, talk with your health care provider about stopping the medicine, decreasing the dose, or changing to another medicine. Depression caused by the abuse of alcohol or illicit drugs goes away when you stop using these substances. Some adults need professional help in order to stop drinking or using drugs. SEEK IMMEDIATE MEDICAL CARE IF:  You have thoughts about hurting yourself or others.  You lose touch with reality (have psychotic symptoms).  You are taking medicine for depression and have a serious side effect. FOR MORE INFORMATION  National Alliance on Mental Illness: www.nami.[employer] of Mental Health: http://www.maynard.net/ Document Released: 04/10/2000 Document Revised: 08/28/2013 Document Reviewed: 07/13/2011 [facility] Patient Information 2015 [place], [place]. This information is not intended to replace advice given to you by your health care provider. Make sure you discuss any questions you have with your health care provider. Try zoloft take 1 daily Follow up in 2 weeks  with Dr Emelda Fear

## 2014-12-06 NOTE — Progress Notes (Signed)
Subjective:     Patient ID: Ruth Mullen, female   DOB: Mar 06, 1986, 29 y.o.   MRN: [id number]  HPI Ruth Mullen is a 29 year old black female in complaining of bleeding for 3 weeks and she is teary and feels nauseated and has lost weight.Her boyfriend of 3 years died from motor cycle wreck in spring.She is seeing Ruth Mullen, psychiatrist on [employer].She says she wants tubes tied and to stop periods.She denies suicidal ideations. She has had bleeding issues with OCs and depo in past.  Review of Systems Patient denies any headaches, hearing loss, fatigue, blurred vision, shortness of breath, chest pain, abdominal pain, problems with bowel movements, urination, or intercourse(not having sex). No joint pain, see HPI for positives.  Reviewed past medical,surgical, social and family history. Reviewed medications and allergies.     Objective:   Physical Exam BP 130/80 mmHg  Pulse 96  Ht 5' (1.524 m)  Wt 106 lb (48.081 kg)  BMI 20.70 kg/m2  LMP 11/14/2014 (Approximate)  Breastfeeding? No UPT negative, Skin warm and dry.Pelvic: external genitalia is normal in appearance no lesions, vagina: period like blood without odor,urethra has no lesions or masses noted, cervix:smooth and bulbous, uterus: normal size, shape and contour, non tender, no masses felt, adnexa: no masses or tenderness noted. Bladder is non tender and no masses felt., she is teary, PHQ 9 score 20.Discussed trying meds for depression and she agrees, just does not want to feel like zombie, will continue megace for now and also talked about IUD vs tubal, but she is set on not wanting more children,no matter what, had cerclage with last child. Has lost over 15 lbs.She agrees to trying zoloft and knows can take 2 weeks, before she notices a difference.She also knows she can continue megace for a while.Face time 25 minutes, with over 50 % counseling.    Assessment:     AUB Depression    Plan:    Continue megace  Rx zoloft 50 mg  #30 take 1 daily with 2 refills Review handouts on IUD,ablation and tubal and depression Follow up in 2 weeks to take with Dr Emelda Fear about options

## 2014-12-20 ENCOUNTER — Ambulatory Visit: Payer: Medicaid Other | Admitting: Adult Health

## 2015-03-19 ENCOUNTER — Other Ambulatory Visit: Payer: Self-pay | Admitting: Advanced Practice Midwife

## 2015-03-23 IMAGING — CR DG CHEST 2V
2 series · 2 of 2 positions shown · non-contrast
Comparison: 08/23/2012

CLINICAL DATA: Chest pain

EXAM:
CHEST  2 VIEW

[view not recorded (1 of 2)]
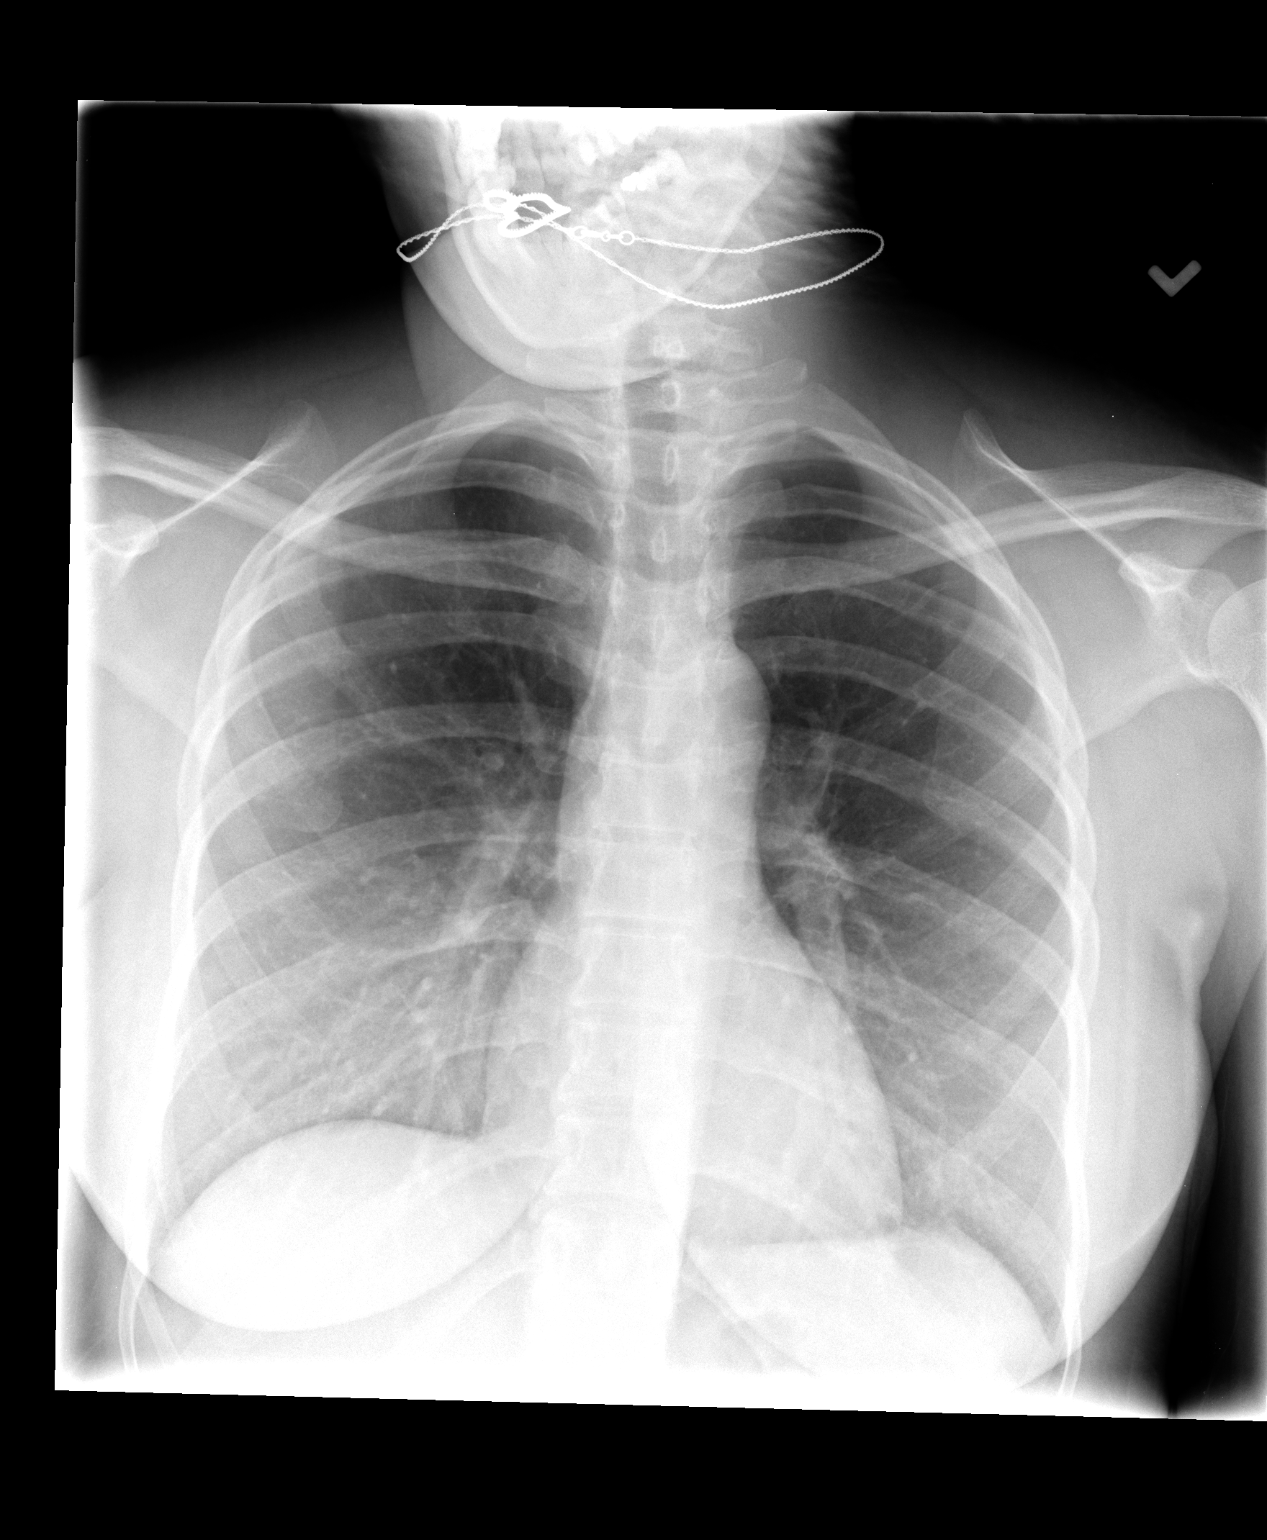

[view not recorded (2 of 2)]
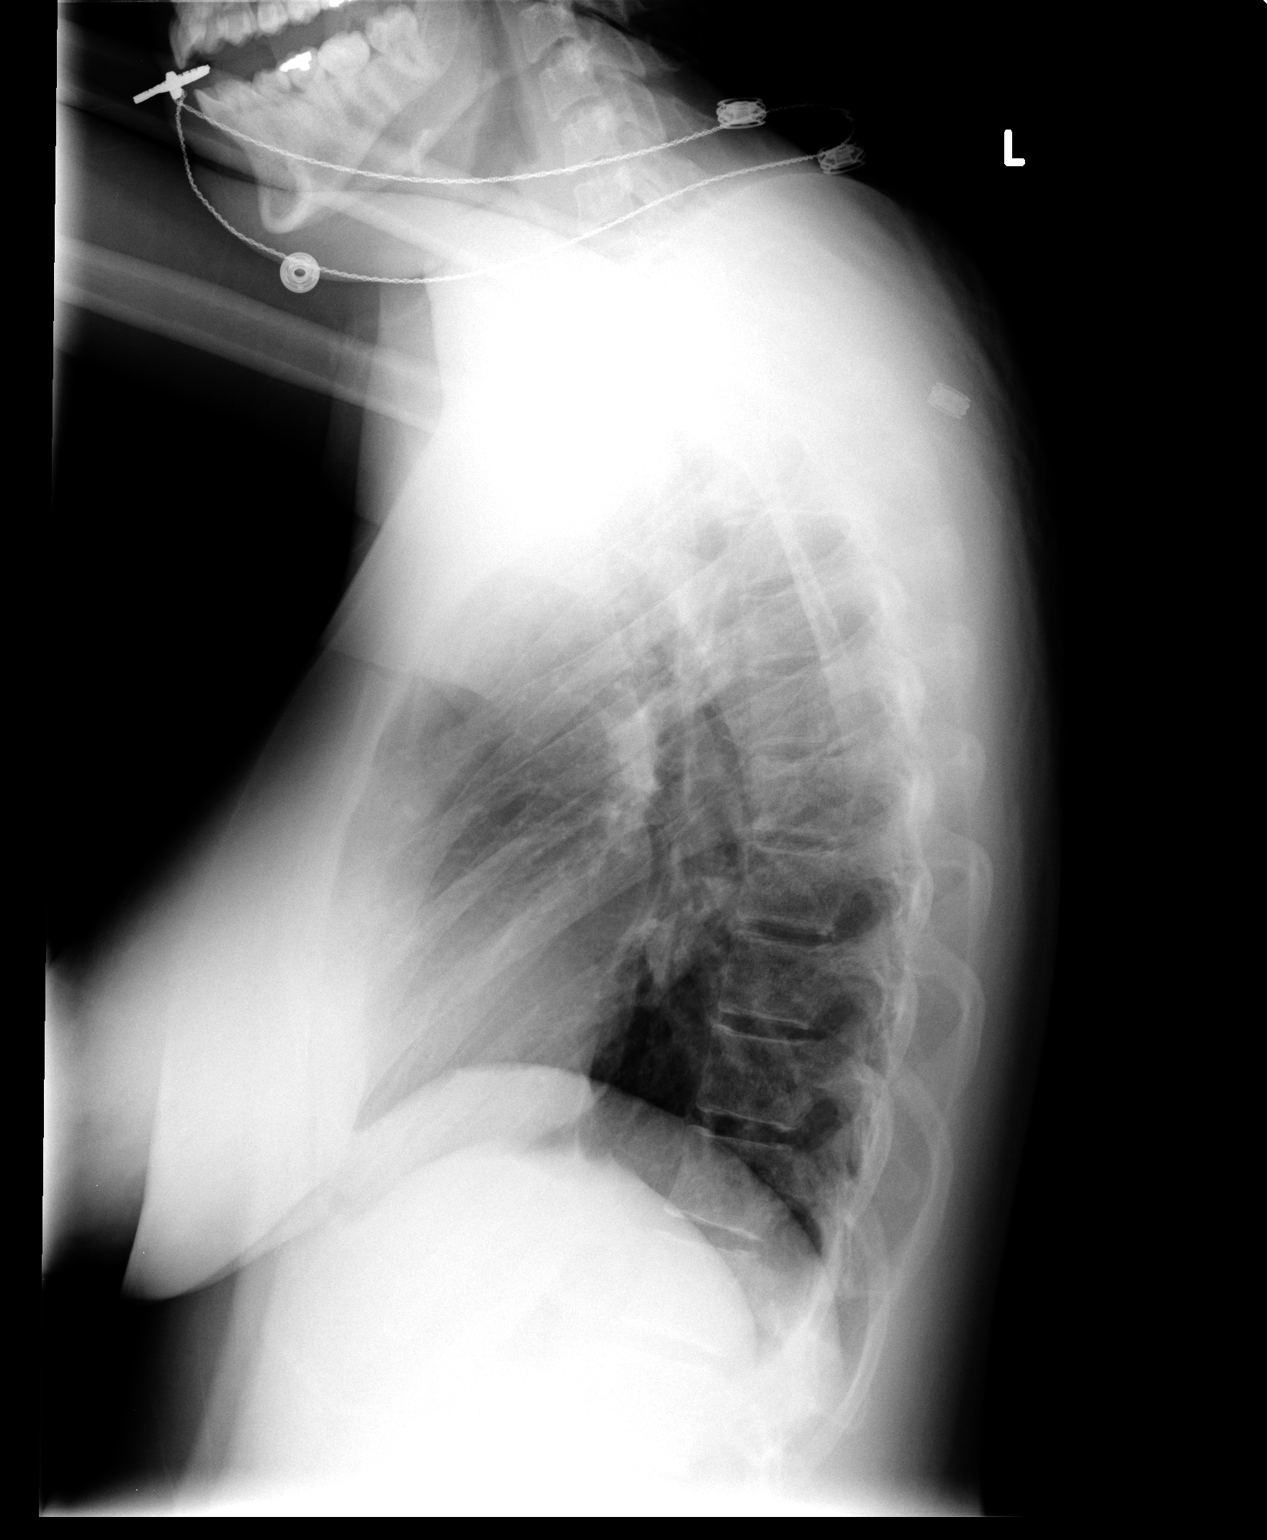

[2 of 2 positions shown; findings below may reference images not displayed]

FINDINGS: The heart size and mediastinal contours are within normal limits.
Both lungs are clear. The visualized skeletal structures are
unremarkable.
IMPRESSION: No active cardiopulmonary disease.

## 2015-03-27 ENCOUNTER — Telehealth: Payer: Self-pay | Admitting: Adult Health

## 2015-03-27 MED ORDER — ESCITALOPRAM OXALATE 10 MG PO TABS: 10.0000 mg | ORAL_TABLET | Freq: Every day | ORAL | Status: DC

## 2015-03-27 NOTE — Telephone Encounter (Signed)
Has been on zoloft and is having diarrhea with it, will stop it and rx lexapro

## 2015-04-17 ENCOUNTER — Ambulatory Visit: Payer: Medicaid Other | Admitting: Obstetrics and Gynecology

## 2015-04-18 ENCOUNTER — Ambulatory Visit (INDEPENDENT_AMBULATORY_CARE_PROVIDER_SITE_OTHER): Payer: Medicaid Other | Admitting: Advanced Practice Midwife

## 2015-04-18 ENCOUNTER — Telehealth: Payer: Self-pay | Admitting: *Deleted

## 2015-04-18 ENCOUNTER — Encounter: Payer: Self-pay | Admitting: Advanced Practice Midwife

## 2015-04-18 VITALS — BP 120/80 | HR 80 | Ht 60.4 in | Wt 112.3 lb

## 2015-04-18 DIAGNOSIS — A6000 Herpesviral infection of urogenital system, unspecified: Secondary | ICD-10-CM

## 2015-04-18 DIAGNOSIS — A609 Anogenital herpesviral infection, unspecified: Secondary | ICD-10-CM

## 2015-04-18 DIAGNOSIS — M545 Low back pain: Secondary | ICD-10-CM

## 2015-04-18 LAB — POCT URINALYSIS DIPSTICK
Glucose, UA: NEGATIVE
KETONES UA: NEGATIVE
Leukocytes, UA: NEGATIVE
NITRITE UA: NEGATIVE
Protein, UA: NEGATIVE

## 2015-04-18 MED ORDER — LIDOCAINE 5 % EX PTCH: 1.0000 | MEDICATED_PATCH | CUTANEOUS | Status: DC

## 2015-04-18 MED ORDER — LIDOCAINE 5 % EX OINT: 1.0000 "application " | TOPICAL_OINTMENT | CUTANEOUS | Status: DC | PRN

## 2015-04-18 MED ORDER — VALACYCLOVIR HCL 1 G PO TABS: 1000.0000 mg | ORAL_TABLET | Freq: Every day | ORAL | Status: DC

## 2015-04-18 MED ORDER — NAPROXEN 500 MG PO TABS: 500.0000 mg | ORAL_TABLET | Freq: Two times a day (BID) | ORAL | Status: DC

## 2015-04-18 NOTE — Progress Notes (Signed)
[facility] Visit  Patient name: Ruth Mullen MRN [id number]  Date of birth: 1985-09-02  CC & HPI:  Ruth Mullen is a 29 y.o. African American female presenting today for C/O painful HSV outbreak, started ~ 2 weeks ago, getting better but still sore.  Has been on acyclovir  TID, but this is the 3rd outbreak in 9 months.  Started having bilateral mid back pain that radiates down buttocks a few days ago.  Feels dull with some sharp exacerbations.  No dysuria, frequency, urgency.   Pertinent History Reviewed:  Medical & Surgical Hx:   Past Medical History  Diagnosis Date  . 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 Chlamydia   . Hypertension   . Vaginal discharge 09/07/2013  . BV (bacterial vaginosis) 09/07/2013  . Fatigue 06/28/2014  . Anxiety as acute reaction to exceptional stress   . Depression 12/06/2014   Past Surgical History  Procedure Laterality Date  . Dilation and curettage of uterus    . Cholecystectomy    . Cervical cerclage N/A 02/08/2013    Procedure: CERCLAGE CERVICAL MCDONALD;  Surgeon: Tilda Burrow, MD;  Location: [facility];  Service: Gynecology;  Laterality: N/A;   Family History  Problem Relation Age of Onset  . Hypertension Mother     Current outpatient prescriptions:  .  acyclovir (ZOVIRAX) 400 MG tablet, TAKE 1 TABLET BY MOUTH THREE TIMES DAILY, Disp: 90 tablet, Rfl: 0 .  amLODipine (NORVASC) 5 MG tablet, Take 1 tablet (5 mg total) by mouth daily., Disp: 30 tablet, Rfl: 0 .  megestrol (MEGACE) 40 MG tablet, Take 1 tablet (40 mg total) by mouth 3 (three) times daily., Disp: 45 tablet, Rfl: 2 .  desogestrel-ethinyl estradiol (APRI,EMOQUETTE,SOLIA) 0.15-30 MG-MCG tablet, Take 1 tablet by mouth daily. (Patient not taking: Reported on 04/18/2015), Disp: 1 Package, Rfl: 11 .  escitalopram (LEXAPRO) 10 MG tablet, Take 1 tablet (10 mg total) by mouth daily. (Patient not taking: Reported on 04/18/2015), Disp: 30 tablet, Rfl: 3 .  lidocaine (LIDODERM)  5 %, Place 1 patch onto the skin daily. Remove & Discard patch within 12 hours or as directed by MD, Disp: 30 patch, Rfl: 0 .  lidocaine (XYLOCAINE) 5 % ointment, Apply 1 application topically as needed., Disp: 35.44 g, Rfl: 0 .  naproxen (NAPROSYN) 500 MG tablet, Take 1 tablet (500 mg total) by mouth 2 (two) times daily with a meal., Disp: 30 tablet, Rfl: 1 .  valACYclovir (VALTREX) 1000 MG tablet, Take 1 tablet (1,000 mg total) by mouth daily., Disp: 30 tablet, Rfl: 11 Social History: Reviewed -  reports that she quit smoking about 6 months ago. Her smoking use included Cigarettes. She smoked 0.00 packs per day for 1 year. She has never used smokeless tobacco.  Review of Systems:   Constitutional: Negative for fever and chills Eyes: Negative for visual disturbances Respiratory: Negative for shortness of breath, dyspnea Cardiovascular: Negative for chest pain or palpitations  Gastrointestinal: Negative for vomiting, diarrhea and constipation; no abdominal pain Genitourinary: Negative for dysuria and urgency, vaginal  itching Musculoskeletal: POSITIVE back pain Neurological: Negative for dizziness and headaches    Objective Findings:    Physical Examination: General appearance - well appearing, and in no distress Mental status - alert, oriented to person, place, and time Chest:  Normal respiratory effort Heart - normal rate and regular rhythm Abdomen:  Soft, nontender Pelvic: HSV outbreak left labia, healing stage.  SSE:  Scant amount of  brown dc, no odor.  Wet prep neg. Musculoskeletal:  Normal range of motion without pain.  Has some bilateral tenderness in CVA, radiates down to buttocks.  Supraspinous muscles normal Extremities:  No edema  UA trace blood  No results found for this or any previous visit (from the past 24 hour(s)).    Assessment & Plan:  A:   HSV outbreak, failed supressive therapy  LBP, probably musculoskeletal/sciatica P:  Start Valtrex 1gm PO; if fails,  increase to BID  Lidocaine ointment to vulva prn  Lidoderm patch sample to back (doesnt want muscle relaxer)   Return if symptoms worsen or fail to improve.  CRESENZO-DISHMAN,Laquesha Holcomb CNM 04/18/2015 10:12 AM

## 2015-04-18 NOTE — Telephone Encounter (Signed)
[facility] [place] per pt request.

## 2015-04-19 ENCOUNTER — Telehealth: Payer: Self-pay | Admitting: *Deleted

## 2015-04-19 NOTE — Telephone Encounter (Signed)
Opened in error

## 2015-04-19 NOTE — Telephone Encounter (Signed)
Pt states [facility] in [place] did not have her 4 prescriptions. Pt informed [facility] [place], pt can call them and have them transfer the Rx here. Pt verbalized understanding.

## 2015-05-14 ENCOUNTER — Other Ambulatory Visit: Payer: Self-pay | Admitting: Obstetrics & Gynecology

## 2015-05-23 ENCOUNTER — Other Ambulatory Visit: Payer: Self-pay | Admitting: Obstetrics and Gynecology

## 2015-05-27 ENCOUNTER — Other Ambulatory Visit: Payer: Self-pay | Admitting: *Deleted

## 2015-05-31 MED ORDER — MEGESTROL ACETATE 40 MG PO TABS: 40.0000 mg | ORAL_TABLET | Freq: Three times a day (TID) | ORAL | Status: DC

## 2015-05-31 NOTE — Telephone Encounter (Signed)
Megace 40 mg  Tab, x 45 tab, refil x 1

## 2015-09-09 ENCOUNTER — Other Ambulatory Visit: Payer: Self-pay | Admitting: Obstetrics and Gynecology

## 2015-11-04 ENCOUNTER — Other Ambulatory Visit: Payer: Self-pay | Admitting: Obstetrics and Gynecology

## 2015-11-15 ENCOUNTER — Encounter (HOSPITAL_COMMUNITY): Payer: Self-pay | Admitting: *Deleted

## 2015-11-15 ENCOUNTER — Emergency Department (HOSPITAL_COMMUNITY)
Admission: EM | Admit: 2015-11-15 | Discharge: 2015-11-15 | Disposition: A | Discharge status: Home / Self Care | Payer: Medicaid Other | Attending: Emergency Medicine | Admitting: Emergency Medicine

## 2015-11-15 DIAGNOSIS — Z87891 Personal history of nicotine dependence: Secondary | ICD-10-CM | POA: Diagnosis not present

## 2015-11-15 DIAGNOSIS — I10 Essential (primary) hypertension: Secondary | ICD-10-CM | POA: Diagnosis not present

## 2015-11-15 DIAGNOSIS — R112 Nausea with vomiting, unspecified: Secondary | ICD-10-CM | POA: Diagnosis not present

## 2015-11-15 LAB — COMPREHENSIVE METABOLIC PANEL
ALK PHOS: 55 U/L (ref 38–126)
ALT: 19 U/L (ref 14–54)
AST: 21 U/L (ref 15–41)
Albumin: 4.6 g/dL (ref 3.5–5.0)
Anion gap: 7 (ref 5–15)
BUN: 5 mg/dL — AB (ref 6–20)
CHLORIDE: 105 mmol/L (ref 101–111)
CO2: 24 mmol/L (ref 22–32)
CREATININE: 0.61 mg/dL (ref 0.44–1.00)
Calcium: 8.9 mg/dL (ref 8.9–10.3)
GFR calc Af Amer: >60 mL/min (ref >60)
Glucose, Bld: 89 mg/dL (ref 65–99)
Potassium: 3.6 mmol/L (ref 3.5–5.1)
Sodium: 136 mmol/L (ref 135–145)
Total Bilirubin: 1.2 mg/dL (ref 0.3–1.2)
Total Protein: 7.7 g/dL (ref 6.5–8.1)

## 2015-11-15 LAB — CBC WITH DIFFERENTIAL/PLATELET
Basophils Absolute: 0 10*3/uL (ref 0.0–0.1)
Basophils Relative: 0 %
EOS ABS: 0.1 10*3/uL (ref 0.0–0.7)
EOS PCT: 1 %
HCT: 37.7 % (ref 36.0–46.0)
Hemoglobin: 13 g/dL (ref 12.0–15.0)
LYMPHS ABS: 3.6 10*3/uL (ref 0.7–4.0)
LYMPHS PCT: 46 %
MCH: 26.6 pg (ref 26.0–34.0)
MCHC: 34.5 g/dL (ref 30.0–36.0)
MCV: 77.1 fL — ABNORMAL LOW (ref 78.0–100.0)
MONO ABS: 0.2 10*3/uL (ref 0.1–1.0)
Monocytes Relative: 3 %
Neutro Abs: 3.8 10*3/uL (ref 1.7–7.7)
Neutrophils Relative %: 50 %
PLATELETS: 342 10*3/uL (ref 150–400)
RBC: 4.89 MIL/uL (ref 3.87–5.11)
RDW: 14.2 % (ref 11.5–15.5)
WBC: 7.7 10*3/uL (ref 4.0–10.5)

## 2015-11-15 LAB — URINALYSIS, ROUTINE W REFLEX MICROSCOPIC
BILIRUBIN URINE: NEGATIVE
GLUCOSE, UA: NEGATIVE mg/dL
Ketones, ur: 40 mg/dL — AB
Leukocytes, UA: NEGATIVE
Nitrite: NEGATIVE
Protein, ur: NEGATIVE mg/dL
SPECIFIC GRAVITY, URINE: 1.02 (ref 1.005–1.030)
pH: 7 (ref 5.0–8.0)

## 2015-11-15 LAB — URINE MICROSCOPIC-ADD ON: WBC, UA: NONE SEEN WBC/hpf (ref 0–5)

## 2015-11-15 LAB — LIPASE, BLOOD: Lipase: 22 U/L (ref 11–51)

## 2015-11-15 LAB — PREGNANCY, URINE: Preg Test, Ur: NEGATIVE

## 2015-11-15 MED ORDER — ONDANSETRON HCL 4 MG PO TABS: 4.0000 mg | ORAL_TABLET | Freq: Three times a day (TID) | ORAL | Status: DC | PRN

## 2015-11-15 MED ORDER — SODIUM CHLORIDE 0.9 % IV BOLUS (SEPSIS)
1000.0000 mL | Freq: Once | INTRAVENOUS | Status: AC
  Administered 2015-11-15: 1000 mL via INTRAVENOUS

## 2015-11-15 MED ORDER — FAMOTIDINE IN NACL 20-0.9 MG/50ML-% IV SOLN
20.0000 mg | Freq: Once | INTRAVENOUS | Status: AC
  Administered 2015-11-15: 20 mg via INTRAVENOUS
  Filled 2015-11-15: qty 50

## 2015-11-15 MED ORDER — ONDANSETRON HCL 4 MG/2ML IJ SOLN
4.0000 mg | INTRAMUSCULAR | Status: DC | PRN
  Administered 2015-11-15: 4 mg via INTRAVENOUS
  Filled 2015-11-15: qty 2

## 2015-11-15 NOTE — ED Provider Notes (Signed)
CSN: [id number]     Arrival date & time 11/15/15  1842 History   First MD Initiated Contact with Patient 11/15/15 2003     Chief Complaint  Patient presents with  . Emesis      HPI  Pt was seen at 2010. Per pt, c/o gradual onset and persistence of multiple intermittent episodes of N/V that began 3 days ago. States she had one episode of diarrhea 3 days ago, but none since. States she "feels tired" and "dehydrated." Denies abd pain, no CP/SOB, no back pain, no fevers, no black or blood in stools or emesis.    Past Medical History  Diagnosis Date  . 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 Chlamydia   . Hypertension   . Vaginal discharge 09/07/2013  . BV (bacterial vaginosis) 09/07/2013  . Fatigue 06/28/2014  . Anxiety as acute reaction to exceptional stress   . Depression 12/06/2014   Past Surgical History  Procedure Laterality Date  . Dilation and curettage of uterus    . Cholecystectomy    . Cervical cerclage N/A 02/08/2013    Procedure: CERCLAGE CERVICAL MCDONALD;  Surgeon: Tilda Burrow, MD;  Location: [facility];  Service: Gynecology;  Laterality: N/A;   Family History  Problem Relation Age of Onset  . Hypertension Mother    Social History  Substance Use Topics  . Smoking status: Former Smoker -- 0.00 packs/day for 1 years    Types: Cigarettes    Quit date: 09/21/2014  . Smokeless tobacco: Never Used  . Alcohol Use: No   OB History    Gravida Para Term Preterm AB TAB SAB Ectopic Multiple Living   4 2 2  2 1 1   2      Review of Systems ROS: Statement: All systems negative except as marked or noted in the HPI; Constitutional: Negative for fever and chills. ; ; Eyes: Negative for eye pain, redness and discharge. ; ; ENMT: Negative for ear pain, hoarseness, nasal congestion, sinus pressure and sore throat. ; ; Cardiovascular: Negative for chest pain, palpitations, diaphoresis, dyspnea and peripheral edema. ; ; Respiratory: Negative for cough, wheezing and stridor. ; ;  Gastrointestinal: +N/V. Negative for abdominal pain, blood in stool, hematemesis, jaundice and rectal bleeding. . ; ; Genitourinary: Negative for dysuria, flank pain and hematuria. ; ; Musculoskeletal: Negative for back pain and neck pain. Negative for swelling and trauma.; ; Skin: Negative for pruritus, rash, abrasions, blisters, bruising and skin lesion.; ; Neuro: Negative for headache, lightheadedness and neck stiffness. Negative for weakness, altered level of consciousness, altered mental status, extremity weakness, paresthesias, involuntary movement, seizure and syncope.       Allergies  Review of patient's allergies indicates no known allergies.  Home Medications   Prior to Admission medications   Medication Sig Start Date End Date Taking? Authorizing Provider  acyclovir (ZOVIRAX) 400 MG tablet TAKE 1 TABLET BY MOUTH THREE TIMES DAILY 03/19/15   Jacklyn Shell, CNM  amLODipine (NORVASC) 5 MG tablet Take 1 tablet (5 mg total) by mouth daily. 12/04/14   Cheral Marker, CNM  escitalopram (LEXAPRO) 10 MG tablet Take 1 tablet (10 mg total) by mouth daily. Patient not taking: Reported on 04/18/2015 03/27/15   Adline Potter, NP  lidocaine (LIDODERM) 5 % Place 1 patch onto the skin daily. Remove & Discard patch within 12 hours or as directed by MD 04/18/15   Jacklyn Shell, CNM  lidocaine (XYLOCAINE) 5 % ointment Apply 1 application topically as  needed. 04/18/15   Jacklyn Shell, CNM  megestrol (MEGACE) 40 MG tablet Take 1 tablet (40 mg total) by mouth 3 (three) times daily. 05/31/15   Tilda Burrow, MD  megestrol (MEGACE) 40 MG tablet TAKE 1 TABLET(40 MG) BY MOUTH THREE TIMES DAILY 09/09/15   Tilda Burrow, MD  naproxen (NAPROSYN) 500 MG tablet Take 1 tablet (500 mg total) by mouth 2 (two) times daily with a meal. 04/18/15   Jacklyn Shell, CNM  RECLIPSEN 0.15-30 MG-MCG tablet TAKE 1 TABLET EVERY DAY 05/14/15   Lazaro Arms, MD  valACYclovir  (VALTREX) 1000 MG tablet Take 1 tablet (1,000 mg total) by mouth daily. 04/18/15   Jacklyn Shell, CNM   BP 133/83 mmHg  Pulse 77  Temp(Src) 98.5 F (36.9 C) (Oral)  Resp 14  Ht 5' (1.524 m)  Wt 120 lb (54.432 kg)  BMI 23.44 kg/m2  SpO2 100%   20:48:41 Orthostatic Vital Signs BD  Orthostatic Lying  - BP- Lying: 119/78 mmHg ; Pulse- Lying: 62  Orthostatic Sitting - BP- Sitting: 140/83 mmHg ; Pulse- Sitting: 67  Orthostatic Standing at 0 minutes - BP- Standing at 0 minutes: 143/95 mmHg ; Pulse- Standing at 0 minutes: 70        Physical Exam 2015: Physical examination:  Nursing notes reviewed; Vital signs and O2 SAT reviewed;  Constitutional: Well developed, Well nourished, Well hydrated, In no acute distress; Head:  Normocephalic, atraumatic; Eyes: EOMI, PERRL, No scleral icterus; ENMT: Mouth and pharynx normal, Mucous membranes moist; Neck: Supple, Full range of motion, No lymphadenopathy; Cardiovascular: Regular rate and rhythm, No murmur, rub, or gallop; Respiratory: Breath sounds clear & equal bilaterally, No rales, rhonchi, wheezes.  Speaking full sentences with ease, Normal respiratory effort/excursion; Chest: Nontender, Movement normal; Abdomen: Soft, Nontender, Nondistended, Normal bowel sounds; Genitourinary: No CVA tenderness; Extremities: Pulses normal, No tenderness, No edema, No calf edema or asymmetry.; Neuro: AA&Ox3, Major CN grossly intact.  Speech clear. No gross focal motor or sensory deficits in extremities.; Skin: Color normal, Warm, Dry.   ED Course  Procedures (including critical care time) Labs Review  Imaging Review  I have personally reviewed and evaluated these images and lab results as part of my medical decision-making.   EKG Interpretation None      MDM  MDM Reviewed: previous chart, nursing note and vitals Reviewed previous: labs Interpretation: labs     Results for orders placed or performed during the hospital encounter of  11/15/15  Pregnancy, urine  Result Value Ref Range   Preg Test, Ur NEGATIVE NEGATIVE  Urinalysis, Routine w reflex microscopic (not at [facility])  Result Value Ref Range   Color, Urine YELLOW YELLOW   APPearance CLEAR CLEAR   Specific Gravity, Urine 1.020 1.005 - 1.030   pH 7.0 5.0 - 8.0   Glucose, UA NEGATIVE NEGATIVE mg/dL   Hgb urine dipstick TRACE (A) NEGATIVE   Bilirubin Urine NEGATIVE NEGATIVE   Ketones, ur 40 (A) NEGATIVE mg/dL   Protein, ur NEGATIVE NEGATIVE mg/dL   Nitrite NEGATIVE NEGATIVE   Leukocytes, UA NEGATIVE NEGATIVE  CBC with Differential  Result Value Ref Range   WBC 7.7 4.0 - 10.5 K/uL   RBC 4.89 3.87 - 5.11 MIL/uL   Hemoglobin 13.0 12.0 - 15.0 g/dL   HCT 16.1 09.6 - 04.5 %   MCV 77.1 (L) 78.0 - 100.0 fL   MCH 26.6 26.0 - 34.0 pg   MCHC 34.5 30.0 - 36.0 g/dL   RDW 40.9 81.1 - 91.4 %  Platelets 342 150 - 400 K/uL   Neutrophils Relative % 50 %   Neutro Abs 3.8 1.7 - 7.7 K/uL   Lymphocytes Relative 46 %   Lymphs Abs 3.6 0.7 - 4.0 K/uL   Monocytes Relative 3 %   Monocytes Absolute 0.2 0.1 - 1.0 K/uL   Eosinophils Relative 1 %   Eosinophils Absolute 0.1 0.0 - 0.7 K/uL   Basophils Relative 0 %   Basophils Absolute 0.0 0.0 - 0.1 K/uL  Comprehensive metabolic panel  Result Value Ref Range   Sodium 136 135 - 145 mmol/L   Potassium 3.6 3.5 - 5.1 mmol/L   Chloride 105 101 - 111 mmol/L   CO2 24 22 - 32 mmol/L   Glucose, Bld 89 65 - 99 mg/dL   BUN 5 (L) 6 - 20 mg/dL   Creatinine, Ser 1.610.61 0.44 - 1.00 mg/dL   Calcium 8.9 8.9 - 09.610.3 mg/dL   Total Protein 7.7 6.5 - 8.1 g/dL   Albumin 4.6 3.5 - 5.0 g/dL   AST 21 15 - 41 U/L   ALT 19 14 - 54 U/L   Alkaline Phosphatase 55 38 - 126 U/L   Total Bilirubin 1.2 0.3 - 1.2 mg/dL   GFR calc non Af Amer >60 >60 mL/min   GFR calc Af Amer >60 >60 mL/min   Anion gap 7 5 - 15  Lipase, blood  Result Value Ref Range   Lipase 22 11 - 51 U/L  Urine microscopic-add on  Result Value Ref Range   Squamous Epithelial / LPF 6-30  (A) NONE SEEN   WBC, UA NONE SEEN 0 - 5 WBC/hpf   RBC / HPF 0-5 0 - 5 RBC/hpf   Bacteria, UA RARE (A) NONE SEEN    2340:  Pt has tol PO well while in the ED without N/V.  No stooling while in the ED.  Abd remains benign, VSS. Feels better and wants to go home now. Dx and testing d/w pt.  Questions answered.  Verb understanding, agreeable to d/c home with outpt f/u.      Samuel JesterKathleen Ka Bench, DO 11/18/15 1536

## 2015-11-15 NOTE — Discharge Instructions (Signed)
Take the prescription as directed.  Increase your fluid intake (ie:  Gatoraide) for the next few days, as discussed.  Eat a bland diet and advance to your regular diet slowly as you can tolerate it.  Call your regular medical doctor Monday to schedule a follow up appointment in the next 3 days.  Return to the Emergency Department immediately if not improving (or even worsening) despite taking the medicines as prescribed, any black or bloody stool or vomit, if you develop a fever over "101," or for any other concerns.

## 2015-11-15 NOTE — ED Notes (Signed)
Pt c/o emesis x 3 days. Pt also c/o fatigue and decreased urine output.

## 2015-11-19 ENCOUNTER — Encounter: Payer: Self-pay | Admitting: Adult Health

## 2015-11-19 ENCOUNTER — Ambulatory Visit (INDEPENDENT_AMBULATORY_CARE_PROVIDER_SITE_OTHER): Payer: Medicaid Other | Admitting: Adult Health

## 2015-11-19 DIAGNOSIS — Z3202 Encounter for pregnancy test, result negative: Secondary | ICD-10-CM

## 2015-11-19 DIAGNOSIS — N946 Dysmenorrhea, unspecified: Secondary | ICD-10-CM | POA: Diagnosis not present

## 2015-11-19 DIAGNOSIS — N920 Excessive and frequent menstruation with regular cycle: Secondary | ICD-10-CM

## 2015-11-19 HISTORY — DX: Excessive and frequent menstruation with regular cycle: N92.0

## 2015-11-19 HISTORY — DX: Dysmenorrhea, unspecified: N94.6

## 2015-11-19 LAB — POCT URINE PREGNANCY: Preg Test, Ur: NEGATIVE

## 2015-11-19 MED ORDER — MEGESTROL ACETATE 40 MG PO TABS: ORAL_TABLET | ORAL | 1 refills | Status: DC

## 2015-11-19 NOTE — Patient Instructions (Signed)
Take megace  Return in 1 week for [place]

## 2015-11-19 NOTE — Progress Notes (Signed)
Subjective:     Patient ID: Ruth Mullen, female   DOB: 1985-05-13, 30 y.o.   MRN: [id number]  HPI Ruth Mullen is a 30 year old black female in complaining of heavy painful periods.Has been taking megace and ran out.Periods may last 2 weeks and may change every 2 hours.Wants megace refilled and is thinking about ablation, as option.  Review of Systems Patient denies any headaches, hearing loss, fatigue, blurred vision, shortness of breath, chest pain, problems with bowel movements, urination, or intercourse(not having sex). No joint pain or mood swings.See HPI for positives.  Reviewed past medical,surgical, social and family history. Reviewed medications and allergies.     Objective:   Physical Exam BP 102/68 (BP Location: Left Arm, Patient Position: Sitting, Cuff Size: Normal)   Pulse 80   Ht 5' (1.524 m)   Wt 113 lb 8 oz (51.5 kg)   LMP 10/29/2015   BMI 22.17 kg/m UPT negative, Skin warm and dry. Neck: mid line trachea, normal thyroid, good ROM, no lymphadenopathy noted. Lungs: clear to ausculation bilaterally. Cardiovascular: regular rate and rhythm.Pelvic deferred today.   Discussed ablation and tubal and IUD.Will refill megace, and get [place] next week to assess uterus. Face time 15 minutes with 50% counseling.  Assessment:     Menorrhagia  Dysmenorrhea     Plan:     GC/CHL sent on urine Rx megace 40 mg #45 3 x 5 days then 2 x 5 days then 1 daily with 1 refill Return in 1 week for GYN [place], will talk after [place] results back  Review handouts on IUD,Ablation and tubal

## 2015-11-20 LAB — GC/CHLAMYDIA PROBE AMP
Chlamydia trachomatis, NAA: NEGATIVE
Neisseria gonorrhoeae by PCR: NEGATIVE

## 2015-11-26 ENCOUNTER — Other Ambulatory Visit: Payer: Medicaid Other

## 2015-11-28 ENCOUNTER — Emergency Department (HOSPITAL_COMMUNITY)
Admission: EM | Admit: 2015-11-28 | Discharge: 2015-11-28 | Disposition: A | Discharge status: Home / Self Care | Payer: Medicaid Other | Attending: Emergency Medicine | Admitting: Emergency Medicine

## 2015-11-28 ENCOUNTER — Ambulatory Visit (INDEPENDENT_AMBULATORY_CARE_PROVIDER_SITE_OTHER): Payer: Medicaid Other

## 2015-11-28 ENCOUNTER — Encounter (HOSPITAL_COMMUNITY): Payer: Self-pay | Admitting: Emergency Medicine

## 2015-11-28 DIAGNOSIS — N946 Dysmenorrhea, unspecified: Secondary | ICD-10-CM | POA: Diagnosis not present

## 2015-11-28 DIAGNOSIS — Z87891 Personal history of nicotine dependence: Secondary | ICD-10-CM | POA: Insufficient documentation

## 2015-11-28 DIAGNOSIS — J029 Acute pharyngitis, unspecified: Secondary | ICD-10-CM | POA: Diagnosis present

## 2015-11-28 DIAGNOSIS — R300 Dysuria: Secondary | ICD-10-CM | POA: Insufficient documentation

## 2015-11-28 DIAGNOSIS — N920 Excessive and frequent menstruation with regular cycle: Secondary | ICD-10-CM | POA: Diagnosis not present

## 2015-11-28 DIAGNOSIS — I10 Essential (primary) hypertension: Secondary | ICD-10-CM | POA: Diagnosis not present

## 2015-11-28 DIAGNOSIS — Z79899 Other long term (current) drug therapy: Secondary | ICD-10-CM | POA: Insufficient documentation

## 2015-11-28 DIAGNOSIS — J020 Streptococcal pharyngitis: Secondary | ICD-10-CM | POA: Insufficient documentation

## 2015-11-28 MED ORDER — IBUPROFEN 100 MG/5ML PO SUSP
400.0000 mg | Freq: Once | ORAL | Status: AC
  Administered 2015-11-28: 400 mg via ORAL
  Filled 2015-11-28: qty 20

## 2015-11-28 MED ORDER — AMOXICILLIN 250 MG/5ML PO SUSR
500.0000 mg | Freq: Once | ORAL | Status: AC
  Administered 2015-11-28: 500 mg via ORAL
  Filled 2015-11-28: qty 10

## 2015-11-28 MED ORDER — AMOXICILLIN 400 MG/5ML PO SUSR: 400.0000 mg | Freq: Three times a day (TID) | ORAL | 0 refills | Status: AC

## 2015-11-28 MED ORDER — IBUPROFEN 100 MG/5ML PO SUSP: 600.0000 mg | Freq: Four times a day (QID) | ORAL | 1 refills | Status: DC | PRN

## 2015-11-28 NOTE — ED Triage Notes (Signed)
Pt c/o sore throat and chills x 3 days. States she was seen at [facility] yesterday, diagnosed with strep, and started on amoxicillan. Reports unable to swallow antibiotic.

## 2015-11-28 NOTE — ED Notes (Signed)
PA at bedside.

## 2015-11-28 NOTE — ED Provider Notes (Signed)
[facility]PT Provider Note   CSN: [id number] Arrival date & time: 11/28/15  5284  First Provider Contact:  None       History   Chief Complaint Chief Complaint  Patient presents with  . Sore Throat    HPI Ruth Mullen is a 30 y.o. female.  The history is provided by the patient.  Sore Throat  This is a new problem. The current episode started more than 2 days ago. The problem occurs daily. The problem has been gradually worsening. Pertinent negatives include no chest pain, no abdominal pain, no headaches and no shortness of breath. Exacerbated by: swallowing. Nothing relieves the symptoms. She has tried nothing for the symptoms.    Past Medical History:  Diagnosis Date  . Anxiety as acute reaction to exceptional stress   . BV (bacterial vaginosis) 09/07/2013  . Chlamydia   . Depression 12/06/2014  . Dysmenorrhea 11/19/2015  . Fatigue 06/28/2014  . 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 Hypertension   . Menorrhagia with regular cycle 11/19/2015  . Vaginal discharge 09/07/2013    Patient Active Problem List   Diagnosis Date Noted  . Menorrhagia with regular cycle 11/19/2015  . Dysmenorrhea 11/19/2015  . Depression 12/06/2014  . Fatigue 06/28/2014  . Abnormal uterine bleeding (AUB) 01/15/2014  . Vaginal discharge 09/07/2013  . BV (bacterial vaginosis) 09/07/2013  . H/O cervical incompetence 07/18/2013  . Mild hypertension 07/18/2013  . 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 Marijuana use 01/02/2013    Past Surgical History:  Procedure Laterality Date  . CERVICAL CERCLAGE N/A 02/08/2013   Procedure: CERCLAGE CERVICAL MCDONALD;  Surgeon: Tilda Burrow, MD;  Location: [facility];  Service: Gynecology;  Laterality: N/A;  . CHOLECYSTECTOMY    . DILATION AND CURETTAGE OF UTERUS      OB History    Gravida Para Term Preterm AB Living   4 2 2   2 2    SAB TAB Ectopic Multiple Live Births   1 1     2        Home Medications    Prior to Admission  medications   Medication Sig Start Date End Date Taking? Authorizing Provider  megestrol (MEGACE) 40 MG tablet Take 3 daily for 5 days then 2 daily x5 then 1 daily 11/19/15   Adline Potter, NP  valACYclovir (VALTREX) 500 MG tablet Take 500 mg by mouth daily.    Historical Provider, MD    Family History Family History  Problem Relation Age of Onset  . Hypertension Mother     Social History Social History  Substance Use Topics  . Smoking status: Former Smoker    Packs/day: 0.00    Years: 1.00    Types: Cigarettes    Quit date: 09/21/2014  . Smokeless tobacco: Never Used  . Alcohol use No     Allergies   Review of patient's allergies indicates no known allergies.   Review of Systems Review of Systems  Constitutional: Negative for activity change.       All ROS Neg except as noted in HPI  HENT: Positive for congestion and sore throat. Negative for nosebleeds.   Eyes: Negative for photophobia and discharge.  Respiratory: Negative for cough, shortness of breath and wheezing.   Cardiovascular: Negative for chest pain and palpitations.  Gastrointestinal: Negative for abdominal pain and blood in stool.  Genitourinary: Positive for dysuria. Negative for frequency and hematuria.  Musculoskeletal: Negative for arthralgias, back pain and neck pain.  Skin: Negative.   Neurological: Negative for dizziness, seizures, speech difficulty and headaches.  Psychiatric/Behavioral: Negative for confusion and hallucinations.  All other systems reviewed and are negative.    Physical Exam Updated Vital Signs BP 131/87 (BP Location: Left Arm)   Pulse 89   Temp 98.3 F (36.8 C) (Oral)   Resp 16   Ht 5' (1.524 m)   Wt 51.3 kg   LMP 10/29/2015 (Exact Date)   SpO2 100%   BMI 22.07 kg/m   Physical Exam  Constitutional: She is oriented to person, place, and time. She appears well-developed and well-nourished.  Non-toxic appearance.  HENT:  Head: Normocephalic.  Right Ear: Tympanic  membrane and external ear normal.  Left Ear: Tympanic membrane and external ear normal.  Mouth/Throat: Uvula is midline. Uvula swelling present. Oropharyngeal exudate present.  Eyes: EOM and lids are normal. Pupils are equal, round, and reactive to light.  Neck: Normal range of motion. Neck supple. Carotid bruit is not present.  Cardiovascular: Normal rate, regular rhythm, normal heart sounds, intact distal pulses and normal pulses.   Pulmonary/Chest: Breath sounds normal. No respiratory distress.  Abdominal: Soft. Bowel sounds are normal. There is no tenderness. There is no guarding.  Musculoskeletal: Normal range of motion.  Lymphadenopathy:       Head (right side): No submandibular adenopathy present.       Head (left side): No submandibular adenopathy present.    She has no cervical adenopathy.  Neurological: She is alert and oriented to person, place, and time. She has normal strength. No cranial nerve deficit or sensory deficit.  Skin: Skin is warm and dry.  Psychiatric: She has a normal mood and affect. Her speech is normal.  Nursing note and vitals reviewed.    ED Treatments / Results  Labs (all labs ordered are listed, but only abnormal results are displayed) Labs Reviewed - No data to display  EKG  EKG Interpretation None       Radiology US Transvaginal Non-ob  Result Date: 11/28/2015 GYNECOLOGIC SONOGRAM Ruth Mullen is a 30 y.o. [id number] LMP 10/29/2015 for a pelvic sonogram for menorrhagia. Uterus                      6.1 x 4.7 x 2.8 cm, homogeneous anteverted uterus, wnl Endometrium          1.9 mm, symmetrical, wnl Right ovary             3.3 x 1.9 x 2 cm, wnl Left ovary                2.6 x 2.1 x 1.5 cm, wnl No free fluid,no pain during ultrasound. Technician Comments: PELVIC US TA/TV: Homogeneous anteverted uterus wnl,normal ov's bilat. (mobile),no free fluid seen,EEC 1.9 mm,no pain during ultrasound [employer] 11/28/2015 8:59 AM   US Pelvis Complete  Result  Date: 11/28/2015 GYNECOLOGIC SONOGRAM Ruth Mullen is a 30 y.o. [id number] LMP 10/29/2015 for a pelvic sonogram for menorrhagia. Uterus                      6.1 x 4.7 x 2.8 cm, homogeneous anteverted uterus, wnl Endometrium          1.9 mm, symmetrical, wnl Right ovary             3.3 x 1.9 x 2 cm, wnl Left ovary                2.6  x 2.1 x 1.5 cm, wnl No free fluid,no pain during ultrasound. Technician Comments: PELVIC US TA/TV: Homogeneous anteverted uterus wnl,normal ov's bilat. (mobile),no free fluid seen,EEC 1.9 mm,no pain during ultrasound [employer] 11/28/2015 8:59 AM    Procedures Procedures (including critical care time)  Medications Ordered in ED Medications  amoxicillin (AMOXIL) 250 MG/5ML suspension 500 mg (500 mg Oral Given 11/28/15 0942)  ibuprofen (ADVIL,MOTRIN) 100 MG/5ML suspension 400 mg (400 mg Oral Given 11/28/15 0942)     Initial Impression / Assessment and Plan / ED Course  I have reviewed the triage vital signs and the nursing notes.  Pertinent labs & imaging results that were available during my care of the patient were reviewed by me and considered in my medical decision making (see chart for details).  Clinical Course    *I have reviewed nursing notes, vital signs, and all appropriate lab and imaging results for this patient.**  Final Clinical Impressions(s) / ED Diagnoses Pt can not take tablet meds for strep. She does not want injection. Amoxil liquid ordered.  Pt to follow up with primary MD   Final diagnoses:  Strep pharyngitis    New Prescriptions New Prescriptions   No medications on file     Ivery Quale, Cordelia Poche 11/28/15 2327    Gerhard Munch, MD 11/30/15 [phone]

## 2015-11-28 NOTE — Progress Notes (Signed)
PELVIC US TA/TV: Homogeneous anteverted uterus wnl,normal ov's (mobile),no free fluid seen,EEC 1.9 mm,no pain during ultrasound

## 2015-11-28 NOTE — Discharge Instructions (Signed)
Saltwater gargles maybe helpful. Chloraseptic spray may also be helpful with pain. Please use the liquid ibuprofen 600 mg every 6 hours. Use the liquid Amoxil 3 times daily. Wash hands frequently. Keep your distance from others as this is contagious.

## 2015-12-02 ENCOUNTER — Telehealth: Payer: Self-pay | Admitting: Adult Health

## 2015-12-02 NOTE — Telephone Encounter (Signed)
Left message to call me, if she does, US was normal

## 2015-12-02 NOTE — Telephone Encounter (Signed)
Pt aware US was normal 

## 2015-12-31 ENCOUNTER — Other Ambulatory Visit: Payer: Self-pay | Admitting: Adult Health

## 2016-01-07 ENCOUNTER — Other Ambulatory Visit: Payer: Self-pay | Admitting: Adult Health

## 2016-05-06 ENCOUNTER — Other Ambulatory Visit: Payer: Self-pay | Admitting: Advanced Practice Midwife

## 2016-07-28 ENCOUNTER — Other Ambulatory Visit: Payer: Self-pay | Admitting: Adult Health

## 2016-12-24 ENCOUNTER — Ambulatory Visit: Payer: Medicaid Other | Admitting: Adult Health

## 2017-01-13 ENCOUNTER — Emergency Department (HOSPITAL_COMMUNITY)
Admission: EM | Admit: 2017-01-13 | Discharge: 2017-01-13 | Disposition: A | Discharge status: Home / Self Care | Payer: Self-pay | Attending: Emergency Medicine | Admitting: Emergency Medicine

## 2017-01-13 ENCOUNTER — Encounter (HOSPITAL_COMMUNITY): Payer: Self-pay | Admitting: Emergency Medicine

## 2017-01-13 DIAGNOSIS — Z87891 Personal history of nicotine dependence: Secondary | ICD-10-CM | POA: Insufficient documentation

## 2017-01-13 DIAGNOSIS — Z79899 Other long term (current) drug therapy: Secondary | ICD-10-CM | POA: Insufficient documentation

## 2017-01-13 DIAGNOSIS — K047 Periapical abscess without sinus: Secondary | ICD-10-CM | POA: Insufficient documentation

## 2017-01-13 DIAGNOSIS — I10 Essential (primary) hypertension: Secondary | ICD-10-CM | POA: Insufficient documentation

## 2017-01-13 MED ORDER — TRAMADOL HCL 50 MG PO TABS: 50.0000 mg | ORAL_TABLET | Freq: Four times a day (QID) | ORAL | 0 refills | Status: DC | PRN

## 2017-01-13 MED ORDER — AMOXICILLIN 250 MG PO CAPS
500.0000 mg | ORAL_CAPSULE | Freq: Once | ORAL | Status: AC
  Administered 2017-01-13: 500 mg via ORAL
  Filled 2017-01-13: qty 2

## 2017-01-13 MED ORDER — AMOXICILLIN 500 MG PO CAPS: 500.0000 mg | ORAL_CAPSULE | Freq: Three times a day (TID) | ORAL | 0 refills | Status: AC

## 2017-01-13 NOTE — Discharge Instructions (Signed)
Complete your entire course of antibiotics as prescribed.  You  may use the tramadol for pain relief but do not drive within 4 hours of taking as this will make you drowsy.  Avoid applying heat or ice to this abscess area which can worsen your symptoms.  You may use warm salt water swish and spit treatment or half peroxide and water swish and spit after meals to keep this area clean as discussed.  Call the dentist listed above for further management of your symptoms. ° ° °

## 2017-01-13 NOTE — ED Provider Notes (Signed)
[facility]PT Provider Note   CSN: [id number] Arrival date & time: 01/13/17  1059     History   Chief Complaint Chief Complaint  Patient presents with  . Dental Pain    right upper    HPI Ruth Mullen is a 31 y.o. female.  The history is provided by the patient.  Dental Pain   This is a new problem. The current episode started more than 2 days ago. The problem occurs constantly. The problem has been gradually worsening. The pain is at a severity of 6/10. The pain is moderate. Treatments tried: ibuprofen. The treatment provided no relief.    Past Medical History:  Diagnosis Date  . Anxiety as acute reaction to exceptional stress   . BV (bacterial vaginosis) 09/07/2013  . Chlamydia   . Depression 12/06/2014  . Dysmenorrhea 11/19/2015  . Fatigue 06/28/2014  . 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 Hypertension   . Menorrhagia with regular cycle 11/19/2015  . Vaginal discharge 09/07/2013    Patient Active Problem List   Diagnosis Date Noted  . Menorrhagia with regular cycle 11/19/2015  . Dysmenorrhea 11/19/2015  . Depression 12/06/2014  . Fatigue 06/28/2014  . Abnormal uterine bleeding (AUB) 01/15/2014  . Vaginal discharge 09/07/2013  . BV (bacterial vaginosis) 09/07/2013  . H/O cervical incompetence 07/18/2013  . Mild hypertension 07/18/2013  . 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 Marijuana use 01/02/2013    Past Surgical History:  Procedure Laterality Date  . CERVICAL CERCLAGE N/A 02/08/2013   Procedure: CERCLAGE CERVICAL MCDONALD;  Surgeon: Tilda Burrow, MD;  Location: [facility];  Service: Gynecology;  Laterality: N/A;  . CHOLECYSTECTOMY    . DILATION AND CURETTAGE OF UTERUS      OB History    Gravida Para Term Preterm AB Living   SAB TAB Ectopic Multiple Live Births   Home Medications    Prior to Admission medications   Medication Sig Start Date End Date Taking? Authorizing Provider  amoxicillin (AMOXIL)  500 MG capsule Take 1 capsule (500 mg total) by mouth 3 (three) times daily. 01/13/17 01/23/17  Burgess Amor, PA-C  ibuprofen (CHILD IBUPROFEN) 100 MG/5ML suspension Take 30 mLs (600 mg total) by mouth every 6 (six) hours as needed. 11/28/15   Ivery Quale, PA-C  megestrol (MEGACE) 40 MG tablet TAKE 3 TABLETS BY MOUTH DAILY FOR 5 DAYS THEN TAKE 2 TABLETS DAILY FOR 5 DAYS THEN 1 TABLET DAILY 01/07/16   Adline Potter, NP  traMADol (ULTRAM) 50 MG tablet Take 1 tablet (50 mg total) by mouth every 6 (six) hours as needed. 01/13/17   Burgess Amor, PA-C  valACYclovir (VALTREX) 1000 MG tablet TAKE 1 TABLET BY MOUTH DAILY 05/06/16   Cresenzo-Dishmon, Scarlette Calico, CNM  valACYclovir (VALTREX) 500 MG tablet Take 500 mg by mouth daily.    [provider]    Family History Family History  Problem Relation Age of Onset  . Hypertension Mother     Social History Social History  Substance Use Topics  . Smoking status: Former Smoker    Packs/day: 0.00    Years: 1.00    Types: Cigarettes    Quit date: 09/21/2014  . Smokeless tobacco: Never Used  . Alcohol use No     Allergies   Patient has no known allergies.   Review of Systems Review of Systems  Constitutional: Negative for fever.  HENT: Positive for dental problem. Negative for facial swelling and sore throat.   Respiratory: Negative for shortness of breath.   Musculoskeletal: Negative for neck pain and neck stiffness.     Physical Exam Updated Vital Signs BP 122/71 (BP Location: Right Arm)   Pulse 81   Temp 98.1 F (36.7 C) (Oral)   Resp 18   Ht 5' (1.524 m)   Wt 55.3 kg (122 lb)   LMP 01/03/2017 (Exact Date)   SpO2 99%   BMI 23.83 kg/m   Physical Exam  Constitutional: She is oriented to person, place, and time. She appears well-developed and well-nourished. No distress.  HENT:  Head: Normocephalic and atraumatic.  Right Ear: Tympanic membrane and external ear normal.  Left Ear: Tympanic membrane and external ear normal.   Mouth/Throat: Uvula is midline, oropharynx is clear and moist and mucous membranes are normal. No oral lesions. No trismus in the jaw. Dental caries present. No dental abscesses or uvula swelling.  Sublingual space soft.  Eyes: Conjunctivae are normal.  Neck: Normal range of motion. Neck supple.  Cardiovascular: Normal rate and normal heart sounds.   Pulmonary/Chest: Effort normal.  Abdominal: She exhibits no distension.  Musculoskeletal: Normal range of motion.  Lymphadenopathy:    She has no cervical adenopathy.  Neurological: She is alert and oriented to person, place, and time.  Skin: Skin is warm and dry. No erythema.  Psychiatric: She has a normal mood and affect.     ED Treatments / Results  Labs (all labs ordered are listed, but only abnormal results are displayed) Labs Reviewed - No data to display  EKG  EKG Interpretation None       Radiology No results found.  Procedures Procedures (including critical care time)  Medications Ordered in ED Medications  amoxicillin (AMOXIL) capsule 500 mg (not administered)     Initial Impression / Assessment and Plan / ED Course  I have reviewed the triage vital signs and the nursing notes.  Pertinent labs & imaging results that were available during my care of the patient were reviewed by me and considered in my medical decision making (see chart for details).     Deep cavity right upper 1st molar, suspect apical infection.  No trismus, no facial edema or erythema. No abscess appreciated.  Final Clinical Impressions(s) / ED Diagnoses   Final diagnoses:  Dental infection    New Prescriptions New Prescriptions   AMOXICILLIN (AMOXIL) 500 MG CAPSULE    Take 1 capsule (500 mg total) by mouth 3 (three) times daily.   TRAMADOL (ULTRAM) 50 MG TABLET    Take 1 tablet (50 mg total) by mouth every 6 (six) hours as needed.     Burgess Amor, PA-C 01/13/17 1147    Jacalyn Lefevre, MD 01/13/17 [phone]

## 2017-01-13 NOTE — ED Triage Notes (Signed)
Increasing dental pain to right upper.  History of dental cavities on both sides.  Rates pain 6/10.

## 2017-02-01 ENCOUNTER — Ambulatory Visit (INDEPENDENT_AMBULATORY_CARE_PROVIDER_SITE_OTHER): Payer: Medicaid Other | Admitting: Adult Health

## 2017-02-01 ENCOUNTER — Encounter: Payer: Self-pay | Admitting: Adult Health

## 2017-02-01 ENCOUNTER — Other Ambulatory Visit (HOSPITAL_COMMUNITY)
Admission: RE | Admit: 2017-02-01 | Discharge: 2017-02-01 | Disposition: A | Discharge status: Home / Self Care | Payer: Self-pay | Source: Ambulatory Visit | Attending: Adult Health | Admitting: Adult Health

## 2017-02-01 VITALS — BP 120/62 | HR 96 | Resp 16 | Ht 62.0 in | Wt 121.0 lb

## 2017-02-01 DIAGNOSIS — Z309 Encounter for contraceptive management, unspecified: Secondary | ICD-10-CM

## 2017-02-01 DIAGNOSIS — O3680X0 Pregnancy with inconclusive fetal viability, not applicable or unspecified: Secondary | ICD-10-CM | POA: Insufficient documentation

## 2017-02-01 DIAGNOSIS — Z30011 Encounter for initial prescription of contraceptive pills: Secondary | ICD-10-CM | POA: Insufficient documentation

## 2017-02-01 DIAGNOSIS — Z3009 Encounter for other general counseling and advice on contraception: Secondary | ICD-10-CM

## 2017-02-01 DIAGNOSIS — Z01419 Encounter for gynecological examination (general) (routine) without abnormal findings: Secondary | ICD-10-CM | POA: Insufficient documentation

## 2017-02-01 MED ORDER — NORETHIN-ETH ESTRAD-FE BIPHAS 1 MG-10 MCG / 10 MCG PO TABS: 1.0000 | ORAL_TABLET | Freq: Every day | ORAL | 11 refills | Status: DC

## 2017-02-01 MED ORDER — VALACYCLOVIR HCL 1 G PO TABS: 1000.0000 mg | ORAL_TABLET | Freq: Every day | ORAL | 12 refills | Status: DC

## 2017-02-01 NOTE — Patient Instructions (Signed)
Physical in 1 year Pap in 3 if normal 

## 2017-02-01 NOTE — Progress Notes (Signed)
Patient ID: [name], female   DOB: March 06, 1986, 31 y.o.   MRN: [id number] History of Present Illness: Patrica is a 31 year old black female, G4 P2, A2,in for a well woman gyn exam and pap and request birth control.    Current Medications, Allergies, Past Medical History, Past Surgical History, Family History and Social History were reviewed in [employer] record.     Review of Systems: Patient denies any headaches, hearing loss, fatigue, blurred vision, shortness of breath, chest pain, abdominal pain, problems with bowel movements, urination, or intercourse. No joint pain or mood swings.She is moody with period.     Physical Exam:BP 120/62 (BP Location: Left Arm, Patient Position: Sitting, Cuff Size: Normal)   Pulse 96   Resp 16   Ht  (1.575 m)   Wt 121 lb (54.9 kg)   LMP 01/06/2017 (Exact Date)   SpO2 99%   BMI 22.13 kg/m  General:  Well developed, well nourished, no acute distress Skin:  Warm and dry Neck:  Midline trachea, normal thyroid, good ROM, no lymphadenopathy Lungs; Clear to auscultation bilaterally Breast:  No dominant palpable mass, retraction, or nipple discharge Cardiovascular: Regular rate and rhythm Abdomen:  Soft, non tender, no hepatosplenomegaly Pelvic:  External genitalia is normal in appearance, no lesions.  The vagina is normal in appearance. Urethra has no lesions or masses. The cervix is bulbous.Pap with GC/CHL performed.  Uterus is felt to be normal size, shape, and contour.  No adnexal masses or tenderness noted.Bladder is non tender, no masses felt. Extremities/musculoskeletal:  No swelling or varicosities noted, no clubbing or cyanosis Psych:  No mood changes, alert and cooperative,seems happy PHQ 2 score 0.Will rx lo loestrin, use condoms.   Impression: 1. Encounter for gynecological examination with Papanicolaou smear of cervix   2. Family planning   3. Encounter for initial prescription of contraceptive pills        Plan: Meds ordered this encounter  Medications  . Norethindrone-Ethinyl Estradiol-Fe Biphas (LO LOESTRIN FE) 1 MG-10 MCG / 10 MCG tablet    Sig: Take 1 tablet by mouth daily. Take 1 daily by mouth    Dispense:  1 Package    Refill:  11    BIN [id number], PCN [id number], GRP [id number]    Order Specific Question:   Supervising Provider    Answer:   Duane Lope H [[id number]]  . valACYclovir (VALTREX) 1000 MG tablet    Sig: Take 1 tablet (1,000 mg total) by mouth daily.    Dispense:  30 tablet    Refill:  12    Order Specific Question:   Supervising Provider    Answer:   Lazaro Arms [[id number]]  Use condoms  Check HIV and RPR Physical in 1 year Pap in 3 if normal

## 2017-02-02 LAB — RPR: RPR: NONREACTIVE

## 2017-02-02 LAB — HIV ANTIBODY (ROUTINE TESTING W REFLEX): HIV Screen 4th Generation wRfx: NONREACTIVE

## 2017-02-03 LAB — CYTOLOGY - PAP
CHLAMYDIA, DNA PROBE: NEGATIVE
DIAGNOSIS: NEGATIVE
NEISSERIA GONORRHEA: NEGATIVE

## 2017-03-30 ENCOUNTER — Ambulatory Visit: Payer: Medicaid Other | Admitting: Adult Health

## 2017-03-31 ENCOUNTER — Encounter: Payer: Self-pay | Admitting: Adult Health

## 2017-03-31 ENCOUNTER — Other Ambulatory Visit: Payer: Self-pay

## 2017-03-31 ENCOUNTER — Ambulatory Visit (INDEPENDENT_AMBULATORY_CARE_PROVIDER_SITE_OTHER): Payer: Medicaid Other | Admitting: Adult Health

## 2017-03-31 VITALS — BP 128/76 | HR 100 | Ht 60.0 in | Wt 121.0 lb

## 2017-03-31 DIAGNOSIS — Z3201 Encounter for pregnancy test, result positive: Secondary | ICD-10-CM | POA: Diagnosis not present

## 2017-03-31 DIAGNOSIS — Z3009 Encounter for other general counseling and advice on contraception: Secondary | ICD-10-CM

## 2017-03-31 DIAGNOSIS — O3680X0 Pregnancy with inconclusive fetal viability, not applicable or unspecified: Secondary | ICD-10-CM

## 2017-03-31 DIAGNOSIS — N926 Irregular menstruation, unspecified: Secondary | ICD-10-CM

## 2017-03-31 DIAGNOSIS — Z3490 Encounter for supervision of normal pregnancy, unspecified, unspecified trimester: Secondary | ICD-10-CM

## 2017-03-31 LAB — POCT URINE PREGNANCY: PREG TEST UR: POSITIVE — AB

## 2017-03-31 NOTE — Progress Notes (Signed)
Subjective:     Patient ID: Barnetta HammersmithBrittaney N Streater, female   DOB: 1985/10/16, 31 y.o.   MRN: [id number]  HPI Loretha StaplerBrittaney is a 31 year old black female in for UPT, she just started OCs in October and did not use condoms and then was on antibiotics for tooth.She is teary and not sure what she will do yet.  Review of Systems +missed period, got pregnant on the pill Reviewed past medical,surgical, social and family history. Reviewed medications and allergies.     Objective:   Physical Exam BP 128/76 (BP Location: Right Arm, Patient Position: Sitting, Cuff Size: Normal)   Pulse 100   Ht 5' (1.524 m)   Wt 121 lb (54.9 kg)   LMP 02/02/2017 (Exact Date)   BMI 23.63 kg/m UPT +, about 8+1 week by LMP with EDD 11/09/17.Skin warm and dry. Neck: mid line trachea, normal thyroid, good ROM, no lymphadenopathy noted. Lungs: clear to ausculation bilaterally. Cardiovascular: regular rate and rhythm.Abdomen is soft and non tender.  Will get US next week and she will talk with partner and let me know, what she will do, she did just stop OCs.     Assessment:     1. Missed period   2. Positive pregnancy test   3. Pregnancy, unspecified gestational age   504. Encounter to determine fetal viability of pregnancy, single or unspecified fetus       Plan:    Take [employer] Return in 1 week for dating US Review handouts on first trimester and by [facility]

## 2017-03-31 NOTE — Patient Instructions (Signed)
First Trimester of Pregnancy The first trimester of pregnancy is from week 1 until the end of week 13 (months 1 through 3). A week after a sperm fertilizes an egg, the egg will implant on the wall of the uterus. This embryo will begin to develop into a baby. Genes from you and your partner will form the baby. The female genes will determine whether the baby will be a boy or a girl. At 6-8 weeks, the eyes and face will be formed, and the heartbeat can be seen on ultrasound. At the end of 12 weeks, all the baby's organs will be formed. Now that you are pregnant, you will want to do everything you can to have a healthy baby. Two of the most important things are to get good prenatal care and to follow your health care provider's instructions. Prenatal care is all the medical care you receive before the baby's birth. This care will help prevent, find, and treat any problems during the pregnancy and childbirth. Body changes during your first trimester Your body goes through many changes during pregnancy. The changes vary from woman to woman.  You may gain or lose a couple of pounds at first.  You may feel sick to your stomach (nauseous) and you may throw up (vomit). If the vomiting is uncontrollable, call your health care provider.  You may tire easily.  You may develop headaches that can be relieved by medicines. All medicines should be approved by your health care provider.  You may urinate more often. Painful urination may mean you have a bladder infection.  You may develop heartburn as a result of your pregnancy.  You may develop constipation because certain hormones are causing the muscles that push stool through your intestines to slow down.  You may develop hemorrhoids or swollen veins (varicose veins).  Your breasts may begin to grow larger and become tender. Your nipples may stick out more, and the tissue that surrounds them (areola) may become darker.  Your gums may bleed and may be  sensitive to brushing and flossing.  Dark spots or blotches (chloasma, mask of pregnancy) may develop on your face. This will likely fade after the baby is born.  Your menstrual periods will stop.  You may have a loss of appetite.  You may develop cravings for certain kinds of food.  You may have changes in your emotions from day to day, such as being excited to be pregnant or being concerned that something may go wrong with the pregnancy and baby.  You may have more vivid and strange dreams.  You may have changes in your hair. These can include thickening of your hair, rapid growth, and changes in texture. Some women also have hair loss during or after pregnancy, or hair that feels dry or thin. Your hair will most likely return to normal after your baby is born.  What to expect at prenatal visits During a routine prenatal visit:  You will be weighed to make sure you and the baby are growing normally.  Your blood pressure will be taken.  Your abdomen will be measured to track your baby's growth.  The fetal heartbeat will be listened to between weeks 10 and 14 of your pregnancy.  Test results from any previous visits will be discussed.  Your health care provider may ask you:  How you are feeling.  If you are feeling the baby move.  If you have had any abnormal symptoms, such as leaking fluid, bleeding, severe headaches,   or abdominal cramping.  If you are using any tobacco products, including cigarettes, chewing tobacco, and electronic cigarettes.  If you have any questions.  Other tests that may be performed during your first trimester include:  Blood tests to find your blood type and to check for the presence of any previous infections. The tests will also be used to check for low iron levels (anemia) and protein on red blood cells (Rh antibodies). Depending on your risk factors, or if you previously had diabetes during pregnancy, you may have tests to check for high blood  sugar that affects pregnant women (gestational diabetes).  Urine tests to check for infections, diabetes, or protein in the urine.  An ultrasound to confirm the proper growth and development of the baby.  Fetal screens for spinal cord problems (spina bifida) and Down syndrome.  HIV (human immunodeficiency virus) testing. Routine prenatal testing includes screening for HIV, unless you choose not to have this test.  You may need other tests to make sure you and the baby are doing well.  Follow these instructions at home: Medicines  Follow your health care provider's instructions regarding medicine use. Specific medicines may be either safe or unsafe to take during pregnancy.  Take a prenatal vitamin that contains at least 600 micrograms (mcg) of folic acid.  If you develop constipation, try taking a stool softener if your health care provider approves. Eating and drinking  Eat a balanced diet that includes fresh fruits and vegetables, whole grains, good sources of protein such as meat, eggs, or tofu, and low-fat dairy. Your health care provider will help you determine the amount of weight gain that is right for you.  Avoid raw meat and uncooked cheese. These carry germs that can cause birth defects in the baby.  Eating four or five small meals rather than three large meals a day may help relieve nausea and vomiting. If you start to feel nauseous, eating a few soda crackers can be helpful. Drinking liquids between meals, instead of during meals, also seems to help ease nausea and vomiting.  Limit foods that are high in fat and processed sugars, such as fried and sweet foods.  To prevent constipation: ? Eat foods that are high in fiber, such as fresh fruits and vegetables, whole grains, and beans. ? Drink enough fluid to keep your urine clear or pale yellow. Activity  Exercise only as directed by your health care provider. Most women can continue their usual exercise routine during  pregnancy. Try to exercise for 30 minutes at least 5 days a week. Exercising will help you: ? Control your weight. ? Stay in shape. ? Be prepared for labor and delivery.  Experiencing pain or cramping in the lower abdomen or lower back is a good sign that you should stop exercising. Check with your health care provider before continuing with normal exercises.  Try to avoid standing for long periods of time. Move your legs often if you must stand in one place for a long time.  Avoid heavy lifting.  Wear low-heeled shoes and practice good posture.  You may continue to have sex unless your health care provider tells you not to. Relieving pain and discomfort  Wear a good support bra to relieve breast tenderness.  Take warm sitz baths to soothe any pain or discomfort caused by hemorrhoids. Use hemorrhoid cream if your health care provider approves.  Rest with your legs elevated if you have leg cramps or low back pain.  If you develop   varicose veins in your legs, wear support hose. Elevate your feet for 15 minutes, 3-4 times a day. Limit salt in your diet. Prenatal care  Schedule your prenatal visits by the twelfth week of pregnancy. They are usually scheduled monthly at first, then more often in the last 2 months before delivery.  Write down your questions. Take them to your prenatal visits.  Keep all your prenatal visits as told by your health care provider. This is important. Safety  Wear your seat belt at all times when driving.  Make a list of emergency phone numbers, including numbers for family, friends, the hospital, and police and fire departments. General instructions  Ask your health care provider for a referral to a local prenatal education class. Begin classes no later than the beginning of month 6 of your pregnancy.  Ask for help if you have counseling or nutritional needs during pregnancy. Your health care provider can offer advice or refer you to specialists for help  with various needs.  Do not use hot tubs, steam rooms, or saunas.  Do not douche or use tampons or scented sanitary pads.  Do not cross your legs for long periods of time.  Avoid cat litter boxes and soil used by cats. These carry germs that can cause birth defects in the baby and possibly loss of the fetus by miscarriage or stillbirth.  Avoid all smoking, herbs, alcohol, and medicines not prescribed by your health care provider. Chemicals in these products affect the formation and growth of the baby.  Do not use any products that contain nicotine or tobacco, such as cigarettes and e-cigarettes. If you need help quitting, ask your health care provider. You may receive counseling support and other resources to help you quit.  Schedule a dentist appointment. At home, brush your teeth with a soft toothbrush and be gentle when you floss. Contact a health care provider if:  You have dizziness.  You have mild pelvic cramps, pelvic pressure, or nagging pain in the abdominal area.  You have persistent nausea, vomiting, or diarrhea.  You have a bad smelling vaginal discharge.  You have pain when you urinate.  You notice increased swelling in your face, hands, legs, or ankles.  You are exposed to fifth disease or chickenpox.  You are exposed to German measles (rubella) and have never had it. Get help right away if:  You have a fever.  You are leaking fluid from your vagina.  You have spotting or bleeding from your vagina.  You have severe abdominal cramping or pain.  You have rapid weight gain or loss.  You vomit blood or material that looks like coffee grounds.  You develop a severe headache.  You have shortness of breath.  You have any kind of trauma, such as from a fall or a car accident. Summary  The first trimester of pregnancy is from week 1 until the end of week 13 (months 1 through 3).  Your body goes through many changes during pregnancy. The changes vary from  woman to woman.  You will have routine prenatal visits. During those visits, your health care provider will examine you, discuss any test results you may have, and talk with you about how you are feeling. This information is not intended to replace advice given to you by your health care provider. Make sure you discuss any questions you have with your health care provider. Document Released: 04/07/2001 Document Revised: 03/25/2016 Document Reviewed: 03/25/2016 Elsevier Interactive Patient Education  2017 [employer]vier   Inc.  

## 2017-04-07 ENCOUNTER — Ambulatory Visit (INDEPENDENT_AMBULATORY_CARE_PROVIDER_SITE_OTHER): Payer: Self-pay

## 2017-04-07 ENCOUNTER — Telehealth: Payer: Self-pay | Admitting: Adult Health

## 2017-04-07 DIAGNOSIS — Z3A09 9 weeks gestation of pregnancy: Secondary | ICD-10-CM

## 2017-04-07 DIAGNOSIS — O3680X0 Pregnancy with inconclusive fetal viability, not applicable or unspecified: Secondary | ICD-10-CM

## 2017-04-07 MED ORDER — PROMETHAZINE HCL 25 MG PO TABS: 25.0000 mg | ORAL_TABLET | Freq: Four times a day (QID) | ORAL | 1 refills | Status: DC | PRN

## 2017-04-07 NOTE — Progress Notes (Signed)
US 7+1 wks,single IUP w/ys,positive fht 130 bpm,normal ovaries bilat,crl 10.9 mm,EDD 11/23/2017 by [place]

## 2017-04-07 NOTE — Telephone Encounter (Signed)
Pt has US today is about 7 weeks, needs something for nausea and requests note to sit at work, still not sure if she will continue pregnancy or not.

## 2017-04-21 ENCOUNTER — Ambulatory Visit: Payer: Medicaid Other | Admitting: *Deleted

## 2017-04-21 ENCOUNTER — Encounter: Payer: Medicaid Other | Admitting: Women's Health

## 2017-07-15 ENCOUNTER — Telehealth: Payer: Self-pay | Admitting: Adult Health

## 2017-07-15 NOTE — Telephone Encounter (Signed)
Number busy

## 2017-07-15 NOTE — Telephone Encounter (Signed)
Patient called stating that she would like to know if Victorino DikeJennifer could call her in some Birth control patches. Please contact pt

## 2017-10-14 ENCOUNTER — Emergency Department (HOSPITAL_COMMUNITY): Payer: Self-pay

## 2017-10-14 ENCOUNTER — Emergency Department (HOSPITAL_COMMUNITY)
Admission: EM | Admit: 2017-10-14 | Discharge: 2017-10-14 | Disposition: A | Discharge status: Home / Self Care | Payer: Self-pay | Attending: Emergency Medicine | Admitting: Emergency Medicine

## 2017-10-14 ENCOUNTER — Other Ambulatory Visit: Payer: Self-pay

## 2017-10-14 ENCOUNTER — Encounter (HOSPITAL_COMMUNITY): Payer: Self-pay | Admitting: Emergency Medicine

## 2017-10-14 DIAGNOSIS — I10 Essential (primary) hypertension: Secondary | ICD-10-CM | POA: Insufficient documentation

## 2017-10-14 DIAGNOSIS — Z79899 Other long term (current) drug therapy: Secondary | ICD-10-CM | POA: Insufficient documentation

## 2017-10-14 DIAGNOSIS — R002 Palpitations: Secondary | ICD-10-CM | POA: Insufficient documentation

## 2017-10-14 DIAGNOSIS — Z87891 Personal history of nicotine dependence: Secondary | ICD-10-CM | POA: Insufficient documentation

## 2017-10-14 LAB — CBC
HCT: 36.5 % (ref 36.0–46.0)
HEMOGLOBIN: 12.3 g/dL (ref 12.0–15.0)
MCH: 26 pg (ref 26.0–34.0)
MCHC: 33.7 g/dL (ref 30.0–36.0)
MCV: 77.2 fL — ABNORMAL LOW (ref 78.0–100.0)
Platelets: 308 10*3/uL (ref 150–400)
RBC: 4.73 MIL/uL (ref 3.87–5.11)
RDW: 14.5 % (ref 11.5–15.5)
WBC: 6.6 10*3/uL (ref 4.0–10.5)

## 2017-10-14 LAB — I-STAT BETA HCG BLOOD, ED (MC, WL, AP ONLY)

## 2017-10-14 LAB — BASIC METABOLIC PANEL
ANION GAP: 6 (ref 5–15)
BUN: 5 mg/dL — AB (ref 6–20)
CALCIUM: 8.5 mg/dL — AB (ref 8.9–10.3)
CO2: 25 mmol/L (ref 22–32)
Chloride: 108 mmol/L (ref 101–111)
Creatinine, Ser: 0.51 mg/dL (ref 0.44–1.00)
GFR calc Af Amer: >60 mL/min (ref >60)
GLUCOSE: 102 mg/dL — AB (ref 65–99)
Potassium: 3.4 mmol/L — ABNORMAL LOW (ref 3.5–5.1)
Sodium: 139 mmol/L (ref 135–145)

## 2017-10-14 LAB — TROPONIN I: Troponin I: <0.03 ng/mL (ref <0.03)

## 2017-10-14 NOTE — Discharge Instructions (Addendum)
Test showed no life-threatening condition.  Recommend a primary care doctor.  Stop smoking.  A Holter monitor at some point to assess your palpitations.

## 2017-10-14 NOTE — ED Triage Notes (Signed)
Patient complaining of chest tightness with palpitations x 1 month worsening last night.

## 2017-10-18 NOTE — ED Provider Notes (Signed)
[facility] Provider Note   CSN: [id number] Arrival date & time: 10/14/17  1032     History   Chief Complaint Chief Complaint  Patient presents with  . Palpitations    HPI Ruth Mullen is a 32 y.o. female.  Patient presents with palpitations for 2 years, worse between 10 PM and 4 AM today.  Symptoms described as "fluttering" but no substernal chest pain.  Negative dyspnea, diaphoresis, nausea.  Episodes occur 4-5 times a day and feel like a sense of "butterflies".  They are not associated with activity.  She does smoke cigarettes.  Past medical history hypertension.  No diabetes.     Past Medical History:  Diagnosis Date  . Anxiety as acute reaction to exceptional stress   . BV (bacterial vaginosis) 09/07/2013  . Chlamydia   . Depression 12/06/2014  . Dysmenorrhea 11/19/2015  . Fatigue 06/28/2014  . 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 Hypertension   . Menorrhagia with regular cycle 11/19/2015  . Vaginal discharge 09/07/2013    Patient Active Problem List   Diagnosis Date Noted  . Positive pregnancy test 03/31/2017  . Encounter for initial prescription of contraceptive pills 02/01/2017  . Encounter to determine fetal viability of pregnancy 02/01/2017  . Menorrhagia with regular cycle 11/19/2015  . Dysmenorrhea 11/19/2015  . Depression 12/06/2014  . Fatigue 06/28/2014  . Abnormal uterine bleeding (AUB) 01/15/2014  . Vaginal discharge 09/07/2013  . BV (bacterial vaginosis) 09/07/2013  . H/O cervical incompetence 07/18/2013  . Mild hypertension 07/18/2013  . 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 Marijuana use 01/02/2013  . Pregnancy 11/01/2012    Past Surgical History:  Procedure Laterality Date  . CERVICAL CERCLAGE N/A 02/08/2013   Procedure: CERCLAGE CERVICAL MCDONALD;  Surgeon: Tilda BurrowJohn V Ferguson, MD;  Location: [facility];  Service: Gynecology;  Laterality: N/A;  . CHOLECYSTECTOMY    . DILATION AND CURETTAGE OF UTERUS       OB  History    Gravida  5   Para  2   Term  2   Preterm      AB  2   Living  2     SAB  1   TAB  1   Ectopic      Multiple      Live Births  2            Home Medications    Prior to Admission medications   Medication Sig Start Date End Date Taking? Authorizing Provider  valACYclovir (VALTREX) 1000 MG tablet Take 1 tablet (1,000 mg total) by mouth daily. Patient taking differently: Take 1,000 mg by mouth daily as needed.  02/01/17  Yes Adline PotterGriffin, Jennifer A, NP    Family History Family History  Problem Relation Age of Onset  . Hypertension Mother     Social History Social History   Tobacco Use  . Smoking status: Former Smoker    Packs/day: 0.00    Years: 1.00    Pack years: 0.00    Types: Cigarettes    Last attempt to quit: 09/21/2014    Years since quitting: 3.0  . Smokeless tobacco: Never Used  Substance Use Topics  . Alcohol use: No  . Drug use: No     Allergies   Patient has no known allergies.   Review of Systems Review of Systems  All other systems reviewed and are negative.    Physical Exam Updated Vital Signs BP 103/89   Pulse  72   Temp 98 F (36.7 C) (Oral)   Resp 20   Ht 5' (1.524 m)   Wt 55.3 kg (122 lb)   LMP 09/25/2017   SpO2 98%   Breastfeeding? Unknown   BMI 23.83 kg/m   Physical Exam  Constitutional: She is oriented to person, place, and time. She appears well-developed and well-nourished.  HENT:  Head: Normocephalic and atraumatic.  Eyes: Conjunctivae are normal.  Neck: Neck supple.  Cardiovascular: Normal rate and regular rhythm.  Pulmonary/Chest: Effort normal and breath sounds normal.  Abdominal: Soft. Bowel sounds are normal.  Musculoskeletal: Normal range of motion.  Neurological: She is alert and oriented to person, place, and time.  Skin: Skin is warm and dry.  Psychiatric: She has a normal mood and affect. Her behavior is normal.  Nursing note and vitals reviewed.    ED Treatments / Results   Labs (all labs ordered are listed, but only abnormal results are displayed) Labs Reviewed  BASIC METABOLIC PANEL - Abnormal; Notable for the following components:      Result Value   Potassium 3.4 (*)    Glucose, Bld 102 (*)    BUN 5 (*)    Calcium 8.5 (*)    All other components within normal limits  CBC - Abnormal; Notable for the following components:   MCV 77.2 (*)    All other components within normal limits  TROPONIN I  I-STAT BETA HCG BLOOD, ED ([facility], [facility], [facility] ONLY)    EKG EKG Interpretation  Date/Time:  Thursday October 14 2017 10:45:34 EDT Ventricular Rate:  79 PR Interval:    QRS Duration: 85 QT Interval:  385 QTC Calculation: 442 R Axis:   73 Text Interpretation:  Sinus rhythm Confirmed by Ross Marcus ([id number]) on 10/15/2017 2:24:42 PM   Radiology No results found.  Procedures Procedures (including critical care time)  Medications Ordered in ED Medications - No data to display   Initial Impression / Assessment and Plan / ED Course  I have reviewed the triage vital signs and the nursing notes.  Pertinent labs & imaging results that were available during my care of the patient were reviewed by me and considered in my medical decision making (see chart for details).     Patient presents with palpitations.  Screening tests including labs, EKG, chest x-ray showed no acute findings.  Patient encouraged to stop smoking.  She will follow-up with primary care and/or cardiology for a possible Holter monitor.  Final Clinical Impressions(s) / ED Diagnoses   Final diagnoses:  Palpitations    ED Discharge Orders    None       Donnetta Hutching, MD 10/18/17 [phone]

## 2018-05-12 ENCOUNTER — Other Ambulatory Visit: Payer: Self-pay

## 2018-05-12 ENCOUNTER — Emergency Department (HOSPITAL_COMMUNITY)
Admission: EM | Admit: 2018-05-12 | Discharge: 2018-05-12 | Disposition: A | Discharge status: Home / Self Care | Payer: Medicaid Other | Attending: Emergency Medicine | Admitting: Emergency Medicine

## 2018-05-12 ENCOUNTER — Encounter (HOSPITAL_COMMUNITY): Payer: Self-pay | Admitting: Emergency Medicine

## 2018-05-12 DIAGNOSIS — I10 Essential (primary) hypertension: Secondary | ICD-10-CM | POA: Insufficient documentation

## 2018-05-12 DIAGNOSIS — K0889 Other specified disorders of teeth and supporting structures: Secondary | ICD-10-CM

## 2018-05-12 DIAGNOSIS — K029 Dental caries, unspecified: Secondary | ICD-10-CM

## 2018-05-12 DIAGNOSIS — F17210 Nicotine dependence, cigarettes, uncomplicated: Secondary | ICD-10-CM | POA: Insufficient documentation

## 2018-05-12 MED ORDER — TRAMADOL HCL 50 MG PO TABS: 50.0000 mg | ORAL_TABLET | Freq: Four times a day (QID) | ORAL | 0 refills | Status: DC | PRN

## 2018-05-12 MED ORDER — AMOXICILLIN 500 MG PO CAPS: 500.0000 mg | ORAL_CAPSULE | Freq: Three times a day (TID) | ORAL | 0 refills | Status: DC

## 2018-05-12 MED ORDER — IBUPROFEN 600 MG PO TABS: 600.0000 mg | ORAL_TABLET | Freq: Four times a day (QID) | ORAL | 0 refills | Status: DC

## 2018-05-12 MED ORDER — IBUPROFEN 400 MG PO TABS
400.0000 mg | ORAL_TABLET | Freq: Once | ORAL | Status: AC
  Administered 2018-05-12: 400 mg via ORAL
  Filled 2018-05-12: qty 1

## 2018-05-12 MED ORDER — AMOXICILLIN 250 MG PO CAPS
500.0000 mg | ORAL_CAPSULE | Freq: Once | ORAL | Status: AC
Start: 2018-05-12 — End: 2018-05-12
  Administered 2018-05-12: 500 mg via ORAL
  Filled 2018-05-12: qty 2

## 2018-05-12 NOTE — ED Provider Notes (Signed)
[facility] Provider Note   CSN: [id number] Arrival date & time: 05/12/18  [id number]     History   Chief Complaint Chief Complaint  Patient presents with  . Dental Pain    HPI Ruth Mullen is a 33 y.o. female.  The history is provided by the patient.  Dental Pain  Location:  Lower Lower teeth location:  18/LL 2nd molar and 19/LL 1st molar Quality:  Aching and throbbing Severity:  Moderate Onset quality:  Gradual Duration:  1 week Timing:  Intermittent Progression:  Worsening Context: dental caries and filling fell out   Relieved by:  Nothing Worsened by:  Nothing Ineffective treatments:  Acetaminophen Associated symptoms: gum swelling, headaches and neck pain   Associated symptoms: no fever   Risk factors: lack of dental care   Risk factors: no cancer, no diabetes and no immunosuppression     Past Medical History:  Diagnosis Date  . Anxiety as acute reaction to exceptional stress   . BV (bacterial vaginosis) 09/07/2013  . Chlamydia   . Depression 12/06/2014  . Dysmenorrhea 11/19/2015  . Fatigue 06/28/2014  . 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 Hypertension   . Menorrhagia with regular cycle 11/19/2015  . Vaginal discharge 09/07/2013    Patient Active Problem List   Diagnosis Date Noted  . Positive pregnancy test 03/31/2017  . Encounter for initial prescription of contraceptive pills 02/01/2017  . Encounter to determine fetal viability of pregnancy 02/01/2017  . Menorrhagia with regular cycle 11/19/2015  . Dysmenorrhea 11/19/2015  . Depression 12/06/2014  . Fatigue 06/28/2014  . Abnormal uterine bleeding (AUB) 01/15/2014  . Vaginal discharge 09/07/2013  . BV (bacterial vaginosis) 09/07/2013  . H/O cervical incompetence 07/18/2013  . Mild hypertension 07/18/2013  . 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 Marijuana use 01/02/2013  . Pregnancy 11/01/2012    Past Surgical History:  Procedure Laterality Date  . CERVICAL CERCLAGE  N/A 02/08/2013   Procedure: CERCLAGE CERVICAL MCDONALD;  Surgeon: Tilda BurrowJohn V Ferguson, MD;  Location: [facility];  Service: Gynecology;  Laterality: N/A;  . CHOLECYSTECTOMY    . DILATION AND CURETTAGE OF UTERUS       OB History    Gravida  5   Para  2   Term  2   Preterm      AB  2   Living  2     SAB  1   TAB  1   Ectopic      Multiple      Live Births  2            Home Medications    Prior to Admission medications   Medication Sig Start Date End Date Taking? Authorizing Provider  valACYclovir (VALTREX) 1000 MG tablet Take 1 tablet (1,000 mg total) by mouth daily. Patient taking differently: Take 1,000 mg by mouth daily as needed.  02/01/17   Adline PotterGriffin, Jennifer A, NP    Family History Family History  Problem Relation Age of Onset  . Hypertension Mother     Social History Social History   Tobacco Use  . Smoking status: Current Every Day Smoker    Packs/day: 0.50    Years: 1.00    Pack years: 0.50    Types: Cigarettes    Last attempt to quit: 09/21/2014    Years since quitting: 3.6  . Smokeless tobacco: Never Used  Substance Use Topics  . Alcohol use: No  . Drug use: No  Allergies   Patient has no known allergies.   Review of Systems Review of Systems  Constitutional: Negative for activity change and fever.       All ROS Neg except as noted in HPI  HENT: Negative for nosebleeds.   Eyes: Negative for photophobia and discharge.  Respiratory: Negative for cough, shortness of breath and wheezing.   Cardiovascular: Negative for chest pain and palpitations.  Gastrointestinal: Negative for abdominal pain and blood in stool.  Genitourinary: Negative for dysuria, frequency and hematuria.  Musculoskeletal: Positive for neck pain. Negative for arthralgias and back pain.  Skin: Negative.   Neurological: Positive for headaches. Negative for dizziness, seizures and speech difficulty.  Psychiatric/Behavioral: Negative for confusion and hallucinations.      Physical Exam Updated Vital Signs BP (!) 149/89 (BP Location: Right Arm)   Pulse 87   Temp 98.1 F (36.7 C) (Oral)   Resp 18   Ht 5' (1.524 m)   Wt 54.4 kg   LMP 05/12/2018   SpO2 100%   BMI 23.44 kg/m   Physical Exam Vitals signs and nursing note reviewed.  Constitutional:      Appearance: She is well-developed. She is not toxic-appearing.  HENT:     Head: Normocephalic.     Right Ear: Tympanic membrane and external ear normal.     Left Ear: Tympanic membrane and external ear normal.     Mouth/Throat:     Dentition: Abnormal dentition. Dental tenderness and dental caries present.   Eyes:     General: Lids are normal.     Pupils: Pupils are equal, round, and reactive to light.  Neck:     Musculoskeletal: Normal range of motion and neck supple.     Vascular: No carotid bruit.  Cardiovascular:     Rate and Rhythm: Normal rate and regular rhythm.     Pulses: Normal pulses.     Heart sounds: Normal heart sounds.  Pulmonary:     Effort: No respiratory distress.     Breath sounds: Normal breath sounds.  Abdominal:     General: Bowel sounds are normal.     Palpations: Abdomen is soft.     Tenderness: There is no abdominal tenderness. There is no guarding.  Musculoskeletal: Normal range of motion.  Lymphadenopathy:     Head:     Right side of head: No submandibular adenopathy.     Left side of head: No submandibular adenopathy.     Cervical: No cervical adenopathy.  Skin:    General: Skin is warm and dry.  Neurological:     Mental Status: She is alert and oriented to person, place, and time.     Cranial Nerves: No cranial nerve deficit.     Sensory: No sensory deficit.  Psychiatric:        Speech: Speech normal.      ED Treatments / Results  Labs (all labs ordered are listed, but only abnormal results are displayed) Labs Reviewed - No data to display  EKG None  Radiology No results found.  Procedures Procedures (including critical care  time)  Medications Ordered in ED Medications - No data to display   Initial Impression / Assessment and Plan / ED Course  I have reviewed the triage vital signs and the nursing notes.  Pertinent labs & imaging results that were available during my care of the patient were reviewed by me and considered in my medical decision making (see chart for details).  Final Clinical Impressions(s) / ED Diagnoses MDM  Vital signs reviewed.  Pulse oximetry is 100% on room air.  Within normal limits by my interpretation.  The patient has dental caries on the left lower jaw area, and the right upper jaw area.  The left lower seems to be bothering him more than anything else.  There is some mild swelling around the gum, but no visible abscess.  There is no evidence for Ludewig's angina or other emergent changes.  I have instructed the patient that I would give her resource information concerning dentists.  We also discussed that the dentist would be the one who could give her the most help with her pain and discomfort.  The patient will be started on antibiotics and ibuprofen to assist with her discomfort.  Patient acknowledges understanding of these instructions.   Final diagnoses:  Dental caries  Pain, dental    ED Discharge Orders         Ordered    amoxicillin (AMOXIL) 500 MG capsule  3 times daily     05/12/18 0931    ibuprofen (ADVIL,MOTRIN) 600 MG tablet  4 times daily     05/12/18 0931    traMADol (ULTRAM) 50 MG tablet  Every 6 hours PRN     05/12/18 0931           Ivery QualeBryant, Atsushi Yom, PA-C 05/12/18 2049    Samuel JesterMcManus, Kathleen, DO 05/14/18 [phone]

## 2018-05-12 NOTE — ED Triage Notes (Signed)
Pt c/o dental pain to lower left x 1 week, reports she had a filling come out and she replaced with a temporary filling that has also came out, pain has increased with no relief after taking OTC aspirin, peroxide rinse, orajel, ibuprofen, pt reports she has no dental ins

## 2018-05-12 NOTE — Discharge Instructions (Addendum)
Your blood pressure is mildly elevated.  Please have this rechecked soon.  The dental cavities noted on exam did not show evidence of an abscess at this time, but do need dental treatment as soon as possible.  Please use 600 mg of ibuprofen with breakfast, lunch, dinner, and at bedtime.  Please use Amoxil 3 times daily with food.  Use Ultram at bedtime, or every 6 hours as needed for severe pain. This medication may cause drowsiness. Please do not drink, drive, or participate in activity that requires concentration while taking this medication.  Dental resource information has been included in your discharge instructions.

## 2018-10-09 ENCOUNTER — Other Ambulatory Visit: Payer: Self-pay

## 2018-10-09 ENCOUNTER — Inpatient Hospital Stay (HOSPITAL_COMMUNITY)
Admission: AD | Admit: 2018-10-09 | Discharge: 2018-10-09 | Disposition: A | Discharge status: Home / Self Care | Payer: Medicaid Other | Attending: Obstetrics & Gynecology | Admitting: Obstetrics & Gynecology

## 2018-10-09 DIAGNOSIS — R109 Unspecified abdominal pain: Secondary | ICD-10-CM

## 2018-10-09 DIAGNOSIS — R300 Dysuria: Secondary | ICD-10-CM | POA: Insufficient documentation

## 2018-10-09 NOTE — MAU Note (Signed)
Pt reports to MAU, denies being pregnant.  States she is currently on her period.  Pt reports left sided abd. Pain and urinary freq. And urgency.

## 2018-10-09 NOTE — MAU Provider Note (Signed)
First Provider Initiated Contact with Patient 10/09/18 [phone]      S Ms. KARINGTON ZARAZUA is a 33 y.o. [phone] non-pregnant female who presents to MAU today with complaint of abdominal pain & dysuria. Pt adamant that she is not pregnant & is currently on her period. Reports feelings of dysuria, urinary frequency, & urgency. Denies fever/chills.    O BP 128/84 (BP Location: Right Arm)   Pulse 81   Temp 98.1 F (36.7 C) (Oral)   Resp 18   SpO2 96%  Physical Exam  Nursing note and vitals reviewed. Constitutional: She appears well-developed and well-nourished. No distress.  HENT:  Head: Normocephalic and atraumatic.  Respiratory: Effort normal. No respiratory distress.  Skin: She is not diaphoretic.  Psychiatric: She has a normal mood and affect. Her behavior is normal. Judgment and thought content normal.    A Non pregnant female Medical screening exam complete 1. Dysuria      P Discharge from MAU in stable condition Patient given the option of transfer to [facility] for further evaluation or seek care in outpatient facility of choice List of options for follow-up given  Warning signs for worsening condition that would warrant emergency follow-up discussed Patient may return to MAU as needed for pregnancy related complaints  Jorje Guild, NP 10/09/2018 9:18 AM

## 2018-10-10 ENCOUNTER — Emergency Department (HOSPITAL_COMMUNITY)
Admission: EM | Admit: 2018-10-10 | Discharge: 2018-10-10 | Disposition: A | Discharge status: Home / Self Care | Payer: Medicaid Other | Attending: Emergency Medicine | Admitting: Emergency Medicine

## 2018-10-10 ENCOUNTER — Other Ambulatory Visit: Payer: Self-pay

## 2018-10-10 ENCOUNTER — Emergency Department (HOSPITAL_COMMUNITY): Payer: Medicaid Other

## 2018-10-10 ENCOUNTER — Encounter (HOSPITAL_COMMUNITY): Payer: Self-pay | Admitting: Emergency Medicine

## 2018-10-10 DIAGNOSIS — I10 Essential (primary) hypertension: Secondary | ICD-10-CM | POA: Insufficient documentation

## 2018-10-10 DIAGNOSIS — N760 Acute vaginitis: Secondary | ICD-10-CM | POA: Diagnosis not present

## 2018-10-10 DIAGNOSIS — N10 Acute tubulo-interstitial nephritis: Secondary | ICD-10-CM | POA: Diagnosis not present

## 2018-10-10 DIAGNOSIS — B9689 Other specified bacterial agents as the cause of diseases classified elsewhere: Secondary | ICD-10-CM

## 2018-10-10 DIAGNOSIS — F17210 Nicotine dependence, cigarettes, uncomplicated: Secondary | ICD-10-CM | POA: Insufficient documentation

## 2018-10-10 DIAGNOSIS — N12 Tubulo-interstitial nephritis, not specified as acute or chronic: Secondary | ICD-10-CM

## 2018-10-10 DIAGNOSIS — R1012 Left upper quadrant pain: Secondary | ICD-10-CM | POA: Diagnosis present

## 2018-10-10 LAB — CBC WITH DIFFERENTIAL/PLATELET
Abs Immature Granulocytes: 0.05 10*3/uL (ref 0.00–0.07)
Basophils Absolute: 0 10*3/uL (ref 0.0–0.1)
Basophils Relative: 0 %
Eosinophils Absolute: 0.1 10*3/uL (ref 0.0–0.5)
Eosinophils Relative: 1 %
HCT: 40.9 % (ref 36.0–46.0)
Hemoglobin: 13.1 g/dL (ref 12.0–15.0)
Immature Granulocytes: 0 %
Lymphocytes Relative: 16 %
Lymphs Abs: 2.3 10*3/uL (ref 0.7–4.0)
MCH: 26.4 pg (ref 26.0–34.0)
MCHC: 32 g/dL (ref 30.0–36.0)
MCV: 82.3 fL (ref 80.0–100.0)
Monocytes Absolute: 0.5 10*3/uL (ref 0.1–1.0)
Monocytes Relative: 4 %
Neutro Abs: 11.2 10*3/uL — ABNORMAL HIGH (ref 1.7–7.7)
Neutrophils Relative %: 79 %
Platelets: 373 10*3/uL (ref 150–400)
RBC: 4.97 MIL/uL (ref 3.87–5.11)
RDW: 15 % (ref 11.5–15.5)
WBC: 14.3 10*3/uL — ABNORMAL HIGH (ref 4.0–10.5)
nRBC: 0 % (ref 0.0–0.2)

## 2018-10-10 LAB — COMPREHENSIVE METABOLIC PANEL
ALT: 12 U/L (ref 0–44)
AST: 18 U/L (ref 15–41)
Albumin: 4.3 g/dL (ref 3.5–5.0)
Alkaline Phosphatase: 66 U/L (ref 38–126)
Anion gap: 13 (ref 5–15)
BUN: 9 mg/dL (ref 6–20)
CO2: 26 mmol/L (ref 22–32)
Calcium: 9.6 mg/dL (ref 8.9–10.3)
Chloride: 102 mmol/L (ref 98–111)
Creatinine, Ser: 0.73 mg/dL (ref 0.44–1.00)
GFR calc Af Amer: >60 mL/min (ref >60)
GFR calc non Af Amer: >60 mL/min (ref >60)
Glucose, Bld: 104 mg/dL — ABNORMAL HIGH (ref 70–99)
Potassium: 4 mmol/L (ref 3.5–5.1)
Sodium: 141 mmol/L (ref 135–145)
Total Bilirubin: 0.5 mg/dL (ref 0.3–1.2)
Total Protein: 7.9 g/dL (ref 6.5–8.1)

## 2018-10-10 LAB — URINALYSIS, ROUTINE W REFLEX MICROSCOPIC
Bilirubin Urine: NEGATIVE
Glucose, UA: NEGATIVE mg/dL
Ketones, ur: NEGATIVE mg/dL
Nitrite: POSITIVE — AB
Protein, ur: NEGATIVE mg/dL
Specific Gravity, Urine: 1.014 (ref 1.005–1.030)
WBC, UA: >50 WBC/hpf — ABNORMAL HIGH (ref 0–5)
pH: 5 (ref 5.0–8.0)

## 2018-10-10 LAB — WET PREP, GENITAL
Sperm: NONE SEEN
Trich, Wet Prep: NONE SEEN
Yeast Wet Prep HPF POC: NONE SEEN

## 2018-10-10 LAB — RAPID HIV SCREEN (HIV 1/2 AB+AG)
HIV 1/2 Antibodies: NONREACTIVE
HIV-1 P24 Antigen - HIV24: NONREACTIVE

## 2018-10-10 LAB — POC URINE PREG, ED: Preg Test, Ur: NEGATIVE

## 2018-10-10 LAB — LIPASE, BLOOD: Lipase: 38 U/L (ref 11–51)

## 2018-10-10 MED ORDER — METRONIDAZOLE 500 MG PO TABS: 500.0000 mg | ORAL_TABLET | Freq: Two times a day (BID) | ORAL | 0 refills | Status: DC

## 2018-10-10 MED ORDER — AZITHROMYCIN 250 MG PO TABS
1000.0000 mg | ORAL_TABLET | Freq: Once | ORAL | Status: AC
  Administered 2018-10-10: 1000 mg via ORAL
  Filled 2018-10-10: qty 4

## 2018-10-10 MED ORDER — KETOROLAC TROMETHAMINE 30 MG/ML IJ SOLN
30.0000 mg | Freq: Once | INTRAMUSCULAR | Status: AC
  Administered 2018-10-10: 30 mg via INTRAVENOUS
  Filled 2018-10-10: qty 1

## 2018-10-10 MED ORDER — ONDANSETRON HCL 4 MG PO TABS: 4.0000 mg | ORAL_TABLET | Freq: Four times a day (QID) | ORAL | 0 refills | Status: DC

## 2018-10-10 MED ORDER — SODIUM CHLORIDE 0.9 % IV BOLUS
1000.0000 mL | Freq: Once | INTRAVENOUS | Status: AC
  Administered 2018-10-10: 1000 mL via INTRAVENOUS

## 2018-10-10 MED ORDER — IOHEXOL 300 MG/ML  SOLN
100.0000 mL | Freq: Once | INTRAMUSCULAR | Status: AC | PRN
  Administered 2018-10-10: 100 mL via INTRAVENOUS

## 2018-10-10 MED ORDER — HYDROCODONE-ACETAMINOPHEN 5-325 MG PO TABS: 1.0000 | ORAL_TABLET | ORAL | 0 refills | Status: DC | PRN

## 2018-10-10 MED ORDER — SODIUM CHLORIDE 0.9 % IV SOLN
1.0000 g | Freq: Once | INTRAVENOUS | Status: AC
  Administered 2018-10-10: 1 g via INTRAVENOUS
  Filled 2018-10-10: qty 10

## 2018-10-10 NOTE — ED Provider Notes (Signed)
[facility] EMERGENCY DEPARTMENT Provider Note   CSN: [id number] Arrival date & time: 10/10/18  0516    History   Chief Complaint Chief Complaint  Patient presents with  . Flank Pain    HPI Ruth Mullen is a 33 y.o. female.     Patient presents to the emergency department for evaluation of left flank pain.  She reports that she has been having some pain for several months but 3 days ago started having increased pain.  She went to urgent care yesterday and was told that she had a urinary tract infection, was prescribed Macrobid.  She has taken 2 doses but tonight the pain has significantly worsened.  She reports pain in the left flank that radiates across the center of her abdomen.  Pain worsens when she walks.  She has noticed that she has pain with urination and the pain worsens at the end of the stream, feels like she has to force the rest of the urine out.     Past Medical History:  Diagnosis Date  . Anxiety as acute reaction to exceptional stress   . BV (bacterial vaginosis) 09/07/2013  . Chlamydia   . Depression 12/06/2014  . Dysmenorrhea 11/19/2015  . Fatigue 06/28/2014  . 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 Hypertension   . Menorrhagia with regular cycle 11/19/2015  . Vaginal discharge 09/07/2013    Patient Active Problem List   Diagnosis Date Noted  . Encounter for initial prescription of contraceptive pills 02/01/2017  . Menorrhagia with regular cycle 11/19/2015  . Dysmenorrhea 11/19/2015  . Depression 12/06/2014  . Fatigue 06/28/2014  . Abnormal uterine bleeding (AUB) 01/15/2014  . Vaginal discharge 09/07/2013  . BV (bacterial vaginosis) 09/07/2013  . H/O cervical incompetence 07/18/2013  . Mild hypertension 07/18/2013  . 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 Marijuana use 01/02/2013    Past Surgical History:  Procedure Laterality Date  . CERVICAL CERCLAGE N/A 02/08/2013   Procedure: CERCLAGE CERVICAL MCDONALD;  Surgeon: Tilda BurrowJohn V Ferguson, MD;   Location: [facility];  Service: Gynecology;  Laterality: N/A;  . CHOLECYSTECTOMY    . DILATION AND CURETTAGE OF UTERUS       OB History    Gravida  5   Para  2   Term  2   Preterm      AB  2   Living  2     SAB  1   TAB  1   Ectopic      Multiple      Live Births  2            Home Medications    Prior to Admission medications   Medication Sig Start Date End Date Taking? Authorizing Provider  ibuprofen (ADVIL,MOTRIN) 600 MG tablet Take 1 tablet (600 mg total) by mouth 4 (four) times daily. 05/12/18   Ivery QualeBryant, Hobson, PA-C  traMADol (ULTRAM) 50 MG tablet Take 1 tablet (50 mg total) by mouth every 6 (six) hours as needed. 05/12/18   Ivery QualeBryant, Hobson, PA-C  valACYclovir (VALTREX) 1000 MG tablet Take 1 tablet (1,000 mg total) by mouth daily. Patient taking differently: Take 1,000 mg by mouth daily as needed.  02/01/17   Adline PotterGriffin, Jennifer A, NP    Family History Family History  Problem Relation Age of Onset  . Hypertension Mother     Social History Social History   Tobacco Use  . Smoking status: Current Every Day Smoker    Packs/day: 0.50  Years: 1.00    Pack years: 0.50    Types: Cigarettes    Last attempt to quit: 09/21/2014    Years since quitting: 4.0  . Smokeless tobacco: Never Used  Substance Use Topics  . Alcohol use: No  . Drug use: No     Allergies   Patient has no known allergies.   Review of Systems Review of Systems  Gastrointestinal: Positive for abdominal pain.  Genitourinary: Positive for dysuria, flank pain and pelvic pain.  All other systems reviewed and are negative.    Physical Exam Updated Vital Signs BP (!) 141/91 (BP Location: Left Arm)   Pulse 97   Temp 98.7 F (37.1 C) (Oral)   Resp 16   Ht 5' (1.524 m)   Wt 59.4 kg   LMP 10/03/2018   SpO2 100%   BMI 25.58 kg/m   Physical Exam Vitals signs and nursing note reviewed.  Constitutional:      General: She is not in acute distress.    Appearance: Normal appearance.  She is well-developed.  HENT:     Head: Normocephalic and atraumatic.     Right Ear: Hearing normal.     Left Ear: Hearing normal.     Nose: Nose normal.  Eyes:     Conjunctiva/sclera: Conjunctivae normal.     Pupils: Pupils are equal, round, and reactive to light.  Neck:     Musculoskeletal: Normal range of motion and neck supple.  Cardiovascular:     Rate and Rhythm: Regular rhythm.     Heart sounds: S1 normal and S2 normal. No murmur. No friction rub. No gallop.   Pulmonary:     Effort: Pulmonary effort is normal. No respiratory distress.     Breath sounds: Normal breath sounds.  Chest:     Chest wall: No tenderness.  Abdominal:     General: Bowel sounds are normal.     Palpations: Abdomen is soft.     Tenderness: There is abdominal tenderness in the right lower quadrant, periumbilical area, suprapubic area and left lower quadrant. There is left CVA tenderness. There is no guarding or rebound. Negative signs include Murphy's sign and McBurney's sign.     Hernia: No hernia is present.    Genitourinary:    Comments: External vaginal exam normal  Speculum exam reveals closed cervix with yellowish discharge from os, cervix friable  Very minimal cervical motion tenderness, however  No adnexal masses, fullness or tenderness Musculoskeletal: Normal range of motion.  Skin:    General: Skin is warm and dry.     Findings: No rash.  Neurological:     Mental Status: She is alert and oriented to person, place, and time.     GCS: GCS eye subscore is 4. GCS verbal subscore is 5. GCS motor subscore is 6.     Cranial Nerves: No cranial nerve deficit.     Sensory: No sensory deficit.     Coordination: Coordination normal.  Psychiatric:        Speech: Speech normal.        Behavior: Behavior normal.        Thought Content: Thought content normal.      ED Treatments / Results  Labs (all labs ordered are listed, but only abnormal results are displayed) Labs Reviewed  WET PREP,  GENITAL - Abnormal; Notable for the following components:      Result Value   Clue Cells Wet Prep HPF POC PRESENT (*)    WBC, Wet Prep  HPF POC MANY (*)    All other components within normal limits  COMPREHENSIVE METABOLIC PANEL - Abnormal; Notable for the following components:   Glucose, Bld 104 (*)    All other components within normal limits  URINALYSIS, ROUTINE W REFLEX MICROSCOPIC - Abnormal; Notable for the following components:   Color, Urine AMBER (*)    APPearance HAZY (*)    Hgb urine dipstick MODERATE (*)    Nitrite POSITIVE (*)    Leukocytes,Ua MODERATE (*)    WBC, UA >50 (*)    Bacteria, UA RARE (*)    All other components within normal limits  URINE CULTURE  CBC WITH DIFFERENTIAL/PLATELET  LIPASE, BLOOD  HIV ANTIBODY (ROUTINE TESTING W REFLEX)  RAPID HIV SCREEN (HIV 1/2 AB+AG)  RPR  POC URINE PREG, ED  GC/CHLAMYDIA PROBE AMP ([place]) NOT AT [facility]    EKG None  Radiology No results found.  Procedures Procedures (including critical care time)  Medications Ordered in ED Medications  cefTRIAXone (ROCEPHIN) 1 g in sodium chloride 0.9 % 100 mL IVPB (has no administration in time range)  azithromycin (ZITHROMAX) tablet 1,000 mg (has no administration in time range)  ketorolac (TORADOL) 30 MG/ML injection 30 mg (has no administration in time range)  sodium chloride 0.9 % bolus 1,000 mL (has no administration in time range)     Initial Impression / Assessment and Plan / ED Course  I have reviewed the triage vital signs and the nursing notes.  Pertinent labs & imaging results that were available during my care of the patient were reviewed by me and considered in my medical decision making (see chart for details).        Patient presents to the emergency department for evaluation of flank and abdominal pain.  She reports that she has been having these symptoms of mild pain for months but over the last 4 days she has had increased pain.  She has been noticing  dysuria and left-sided flank pain.  She was seen in urgent care yesterday and started on Macrobid.  She has taken 2 doses but pain worsened overnight.  Patient does have left CVA tenderness but also diffuse abdominal tenderness, predominantly lower and left-sided.  Pelvic exam did reveal some thick yellowish discharge from the cervix, however very minimal if any cervical motion tenderness.  Remainder of examination unremarkable.  Urinalysis does suggest urinary tract infection.  Culture pending.  Patient administered Rocephin 1 g IV for treatment of UTI.  Will add Zithromax 1 g with GC and Chlamydia pending.  Patient will undergo CT scan to further evaluate for the diffuse abdominal tenderness.  Will sign out to oncoming ER physician.  If CT does not show any significant abnormalities, suspect discharged with analgesia and continued antibiotic coverage for urinary tract infection, possible pyelonephritis.  Final Clinical Impressions(s) / ED Diagnoses   Final diagnoses:  Pyelonephritis    ED Discharge Orders    None       , Gwenyth Allegra, MD 10/10/18 [phone]

## 2018-10-10 NOTE — Discharge Instructions (Addendum)
Continue the antibiotic Macrobid.  Take the Zofran and the hydrocodone as needed for pain and nausea.  CT scan was consistent with bladder infection.  Clinically it sounds as if maybe there may be evidence of kidney infection.  Would expect improvement over the next 24 hours.  Return for any new or worse symptoms.  Take the Flagyl for vaginal discharge.

## 2018-10-10 NOTE — ED Notes (Signed)
Patient transported to CT 

## 2018-10-10 NOTE — ED Provider Notes (Signed)
CT scan with findings consistent with cystitis.  No other significant findings.  Patient will be discharged on continuing with her Macrobid pain medication and Zofran for nausea.  Will also add on Flagyl due to some clue cells on the wet prep.  And based on some of the vaginal discharge may be that is the cause of that.   Fredia Sorrow, MD 10/10/18 [phone]

## 2018-10-10 NOTE — ED Triage Notes (Signed)
Pt c/o of flank pain x3 months. Pain increased on Thursday but waited until Sunday to go to urgent care, was told she had a UTI and given abx. Pain increased at this time.

## 2018-10-11 LAB — URINE CULTURE: Culture: 40000 — AB

## 2018-10-11 LAB — RPR: RPR Ser Ql: NONREACTIVE

## 2018-10-12 ENCOUNTER — Telehealth: Payer: Self-pay | Admitting: Emergency Medicine

## 2018-10-12 LAB — GC/CHLAMYDIA PROBE AMP (~~LOC~~) NOT AT ARMC
Chlamydia: NEGATIVE
Neisseria Gonorrhea: POSITIVE — AB

## 2018-10-12 NOTE — Telephone Encounter (Signed)
Post ED Visit - Positive Culture Follow-up: Successful Patient Follow-Up  Culture assessed and recommendations reviewed by:  []  Elenor Quinones, Pharm.D. []  Heide Guile, Pharm.D., BCPS AQ-ID []  Parks Neptune, Pharm.D., BCPS []  Alycia Rossetti, Pharm.D., BCPS []  [place], Pharm.D., BCPS, AAHIVP []  Legrand Como, Pharm.D., BCPS, AAHIVP []  Salome Arnt, PharmD, BCPS []  Johnnette Gourd, PharmD, BCPS []  Hughes Better, PharmD, BCPS []  Leeroy Cha, PharmD Elicia Lamp PharmD  Positive urine culture  []  Patient discharged without antimicrobial prescription and treatment is now indicated []  Organism is resistant to prescribed ED discharge antimicrobial []  Patient with positive blood cultures  Changes discussed with ED provider:Johanna Irene Pap PA New antibiotic prescription symptom check, if not better stop macrobid and amoxicillin 875mg  po q 12 hours x 5 days  Continue flagyl  Patient states dysuria is better continue current antibiotics   Hazle Nordmann 10/12/2018, 2:10 PM

## 2018-10-12 NOTE — Progress Notes (Signed)
ED Antimicrobial Stewardship Positive Culture Follow Up   Ruth Mullen is an 33 y.o. female who presented to [facility] on 10/10/2018 with a chief complaint of  Chief Complaint  Patient presents with  . Flank Pain    Recent Results (from the past 720 hour(s))  Wet prep, genital     Status: Abnormal   Collection Time: 10/10/18  5:35 AM   Specimen: Vaginal  Result Value Ref Range Status   Yeast Wet Prep HPF POC NONE SEEN NONE SEEN Final   Trich, Wet Prep NONE SEEN NONE SEEN Final   Clue Cells Wet Prep HPF POC PRESENT (A) NONE SEEN Final   WBC, Wet Prep HPF POC MANY (A) NONE SEEN Final   Sperm NONE SEEN  Final    Comment: Performed at [facility], [address] [place], [place] [address]  Urine Culture     Status: Abnormal   Collection Time: 10/10/18  5:38 AM   Specimen: Urine, Clean Catch  Result Value Ref Range Status   Specimen Description   Final    URINE, CLEAN CATCH Performed at [facility], [address], [place], [place] [address]    Special Requests   Final    NONE Performed at [facility], [address], [place], [place] [address]    Culture (A)  Final    40,000 COLONIES/mL CORYNEBACTERIUM SPECIES Standardized susceptibility testing for this organism is not available. Performed at [facility], [place] [address], [place], [place] [address]    Report Status 10/11/2018 FINAL  Final    [x]  Treated with Macrobid for UTI and Flagyl for clue cells on wet prep  New antibiotic prescription: Flow Manager to call patient. If feeling better and increased flank pain/dysuria resolving, then no further treatment indicated. If still experiencing symptoms, stop Macrobid and start amoxicillin 875mg  PO Q12H x 5 days. Continue Flagyl regardless.  ED Provider: Janeece Fitting, PA-C   Elicia Lamp P 10/12/2018, 11:35 AM Clinical Pharmacist Monday - Friday phone -  [phone] Saturday - Sunday phone - [phone]

## 2018-10-13 NOTE — ED Notes (Addendum)
Pt called requesting antibiotic for gonnorhea. Dr Laverta Baltimore called in a course of doxy and diflucan to [facility]

## 2018-10-22 ENCOUNTER — Other Ambulatory Visit: Payer: Self-pay

## 2018-10-22 ENCOUNTER — Emergency Department (HOSPITAL_COMMUNITY)
Admission: EM | Admit: 2018-10-22 | Discharge: 2018-10-22 | Disposition: A | Discharge status: Home / Self Care | Payer: Medicaid Other | Attending: Emergency Medicine | Admitting: Emergency Medicine

## 2018-10-22 ENCOUNTER — Encounter (HOSPITAL_COMMUNITY): Payer: Self-pay | Admitting: Emergency Medicine

## 2018-10-22 DIAGNOSIS — K047 Periapical abscess without sinus: Secondary | ICD-10-CM | POA: Diagnosis not present

## 2018-10-22 DIAGNOSIS — F17210 Nicotine dependence, cigarettes, uncomplicated: Secondary | ICD-10-CM | POA: Diagnosis not present

## 2018-10-22 DIAGNOSIS — Z791 Long term (current) use of non-steroidal anti-inflammatories (NSAID): Secondary | ICD-10-CM | POA: Diagnosis not present

## 2018-10-22 DIAGNOSIS — I10 Essential (primary) hypertension: Secondary | ICD-10-CM | POA: Insufficient documentation

## 2018-10-22 DIAGNOSIS — K089 Disorder of teeth and supporting structures, unspecified: Secondary | ICD-10-CM | POA: Diagnosis present

## 2018-10-22 DIAGNOSIS — Z79899 Other long term (current) drug therapy: Secondary | ICD-10-CM | POA: Insufficient documentation

## 2018-10-22 MED ORDER — CLINDAMYCIN HCL 150 MG PO CAPS: 300.0000 mg | ORAL_CAPSULE | Freq: Three times a day (TID) | ORAL | 0 refills | Status: AC

## 2018-10-22 NOTE — Discharge Instructions (Signed)
I have prescribed antibiotics to help with your dental infection, please take 1 tablet 3 times a day for the next 7 days.  You ultimately need to follow-up with a dentist in order to have dental care.  If you experience any fever, difficulty swallowing, pain with eye movement please return to the emergency room.

## 2018-10-22 NOTE — ED Provider Notes (Signed)
[facility] EMERGENCY DEPARTMENT Provider Note   CSN: [id number] Arrival date & time: 10/22/18  1558    History   Chief Complaint Chief Complaint  Patient presents with  . Dental Pain    HPI Ruth Mullen is a 33 y.o. female.     33 y.o female with a  PMH of HSV, Anxiety presents to the ED with a chief complaint of dental pain since november. Patient reports pain along the lower part of her jaw, this is worse with mastication, and air. She reports a similar episode in the past were she was prescribed ibuprofen 600 mg, she reports taking this prior to arrival with improvement in symptoms.  Patient states she is aware she needs to see a dentist, has tried to avoid "the dentist fee since November ".  She denies any difficulty swallowing, pain with eye movement, fevers.  The history is provided by the patient.  Dental Pain Associated symptoms: no fever     Past Medical History:  Diagnosis Date  . Anxiety as acute reaction to exceptional stress   . BV (bacterial vaginosis) 09/07/2013  . Chlamydia   . Depression 12/06/2014  . Dysmenorrhea 11/19/2015  . Fatigue 06/28/2014  . 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 Hypertension   . Menorrhagia with regular cycle 11/19/2015  . Vaginal discharge 09/07/2013    Patient Active Problem List   Diagnosis Date Noted  . Encounter for initial prescription of contraceptive pills 02/01/2017  . Menorrhagia with regular cycle 11/19/2015  . Dysmenorrhea 11/19/2015  . Depression 12/06/2014  . Fatigue 06/28/2014  . Abnormal uterine bleeding (AUB) 01/15/2014  . Vaginal discharge 09/07/2013  . BV (bacterial vaginosis) 09/07/2013  . H/O cervical incompetence 07/18/2013  . Mild hypertension 07/18/2013  . 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 Marijuana use 01/02/2013    Past Surgical History:  Procedure Laterality Date  . CERVICAL CERCLAGE N/A 02/08/2013   Procedure: CERCLAGE CERVICAL MCDONALD;  Surgeon: Jonnie Kind, MD;  Location:  [facility];  Service: Gynecology;  Laterality: N/A;  . CHOLECYSTECTOMY    . DILATION AND CURETTAGE OF UTERUS       OB History    Gravida  5   Para  2   Term  2   Preterm      AB  2   Living  2     SAB  1   TAB  1   Ectopic      Multiple      Live Births  2            Home Medications    Prior to Admission medications   Medication Sig Start Date End Date Taking? Authorizing Provider  clindamycin (CLEOCIN) 150 MG capsule Take 2 capsules (300 mg total) by mouth 3 (three) times daily for 7 days. 10/22/18 10/29/18  Janeece Fitting, PA-C  HYDROcodone-acetaminophen (NORCO/VICODIN) 5-325 MG tablet Take 1 tablet by mouth every 4 (four) hours as needed for moderate pain. 10/10/18   Orpah Greek, MD  ibuprofen (ADVIL,MOTRIN) 600 MG tablet Take 1 tablet (600 mg total) by mouth 4 (four) times daily. 05/12/18   Lily Kocher, PA-C  metroNIDAZOLE (FLAGYL) 500 MG tablet Take 1 tablet (500 mg total) by mouth 2 (two) times daily. 10/10/18   Fredia Sorrow, MD  ondansetron (ZOFRAN) 4 MG tablet Take 1 tablet (4 mg total) by mouth every 6 (six) hours. 10/10/18   Orpah Greek, MD  traMADol (ULTRAM) 50 MG tablet  Take 1 tablet (50 mg total) by mouth every 6 (six) hours as needed. 05/12/18   Ivery QualeBryant, Hobson, PA-C  valACYclovir (VALTREX) 1000 MG tablet Take 1 tablet (1,000 mg total) by mouth daily. Patient taking differently: Take 1,000 mg by mouth daily as needed.  02/01/17   Adline PotterGriffin, Jennifer A, NP    Family History Family History  Problem Relation Age of Onset  . Hypertension Mother     Social History Social History   Tobacco Use  . Smoking status: Current Every Day Smoker    Packs/day: 0.50    Years: 1.00    Pack years: 0.50    Types: Cigarettes    Last attempt to quit: 09/21/2014    Years since quitting: 4.0  . Smokeless tobacco: Never Used  Substance Use Topics  . Alcohol use: No  . Drug use: No     Allergies   Patient has no known allergies.   Review of  Systems Review of Systems  Constitutional: Negative for fever.  HENT: Positive for dental problem.      Physical Exam Updated Vital Signs BP (!) 140/91 (BP Location: Right Arm)   Pulse 97   Temp 98.5 F (36.9 C) (Oral)   Resp 16   Ht 5' (1.524 m)   Wt 59.4 kg   LMP 10/03/2018   SpO2 100%   BMI 25.58 kg/m   Physical Exam Vitals signs and nursing note reviewed.  Constitutional:      General: She is not in acute distress.    Appearance: She is well-developed.  HENT:     Head: Normocephalic and atraumatic.     Mouth/Throat:     Mouth: Mucous membranes are moist.     Pharynx: Oropharynx is clear. Uvula midline. No oropharyngeal exudate.      Comments: No facial swelling, poor dentition throughout.  Eyes:     Pupils: Pupils are equal, round, and reactive to light.  Neck:     Musculoskeletal: Normal range of motion.  Cardiovascular:     Rate and Rhythm: Regular rhythm.     Heart sounds: Normal heart sounds.  Pulmonary:     Effort: Pulmonary effort is normal. No respiratory distress.     Breath sounds: Normal breath sounds.  Abdominal:     General: Bowel sounds are normal. There is no distension.     Palpations: Abdomen is soft.     Tenderness: There is no abdominal tenderness.  Musculoskeletal:        General: No tenderness or deformity.     Right lower leg: No edema.     Left lower leg: No edema.  Skin:    General: Skin is warm and dry.  Neurological:     Mental Status: She is alert and oriented to person, place, and time.      ED Treatments / Results  Labs (all labs ordered are listed, but only abnormal results are displayed) Labs Reviewed - No data to display  EKG None  Radiology No results found.  Procedures Procedures (including critical care time)  Medications Ordered in ED Medications - No data to display   Initial Impression / Assessment and Plan / ED Course  I have reviewed the triage vital signs and the nursing notes.  Pertinent labs  & imaging results that were available during my care of the patient were reviewed by me and considered in my medical decision making (see chart for details).       Patient with no past medical history  presents to the ED with complaints of left lower dental pain times yesterday.  Reports a similar episode in November, was given 600 mg ibuprofen prescription.  Reports taking this prior to arrival with improvement in symptoms.  During evaluation there is a small amount of white purulent abscess on the left lower gum live. Will provide patient with abx prescription along with dental resources for further follow up. Patient is aware she needs to schedule an appointment to obtain dental care. She understands and agrees with a management. Return precautions provided.    Portions of this note were generated with Scientist, clinical (histocompatibility and immunogenetics)Dragon dictation software. Dictation errors may occur despite best attempts at proofreading.    Final Clinical Impressions(s) / ED Diagnoses   Final diagnoses:  Dental infection    ED Discharge Orders         Ordered    clindamycin (CLEOCIN) 150 MG capsule  3 times daily     10/22/18 1705           Claude MangesSoto, Mirian Casco, PA-C 10/22/18 1708    Bethann BerkshireZammit, Joseph, MD 10/25/18 1310

## 2018-10-22 NOTE — ED Notes (Signed)
Here for eval of tooth pain   Tooth bad for awhile, swelling and increasing pain today

## 2018-10-22 NOTE — ED Triage Notes (Addendum)
Patient c/o left lower dental pain. Per patient dental pain intermittently "for a while" but swelling started this morning. Denies any fevers, sore throat, or ear pain. Per patient took ibuprofen prior to coming to ED.

## 2018-10-29 ENCOUNTER — Other Ambulatory Visit: Payer: Self-pay

## 2018-10-29 ENCOUNTER — Encounter (HOSPITAL_COMMUNITY): Payer: Self-pay | Admitting: *Deleted

## 2018-10-29 ENCOUNTER — Inpatient Hospital Stay (HOSPITAL_COMMUNITY)
Admission: EM | Admit: 2018-10-29 | Discharge: 2018-11-01 | DRG: 918 | Disposition: A | Discharge status: Home / Self Care | Payer: Medicaid Other | Attending: Internal Medicine | Admitting: Internal Medicine

## 2018-10-29 DIAGNOSIS — K0889 Other specified disorders of teeth and supporting structures: Secondary | ICD-10-CM | POA: Diagnosis present

## 2018-10-29 DIAGNOSIS — Z1159 Encounter for screening for other viral diseases: Secondary | ICD-10-CM

## 2018-10-29 DIAGNOSIS — Z8249 Family history of ischemic heart disease and other diseases of the circulatory system: Secondary | ICD-10-CM

## 2018-10-29 DIAGNOSIS — R740 Nonspecific elevation of levels of transaminase and lactic acid dehydrogenase [LDH]: Secondary | ICD-10-CM | POA: Diagnosis not present

## 2018-10-29 DIAGNOSIS — I10 Essential (primary) hypertension: Secondary | ICD-10-CM | POA: Diagnosis present

## 2018-10-29 DIAGNOSIS — I959 Hypotension, unspecified: Secondary | ICD-10-CM | POA: Diagnosis not present

## 2018-10-29 DIAGNOSIS — T391X1A Poisoning by 4-Aminophenol derivatives, accidental (unintentional), initial encounter: Principal | ICD-10-CM | POA: Diagnosis present

## 2018-10-29 DIAGNOSIS — R1084 Generalized abdominal pain: Secondary | ICD-10-CM | POA: Diagnosis not present

## 2018-10-29 DIAGNOSIS — R0689 Other abnormalities of breathing: Secondary | ICD-10-CM | POA: Diagnosis not present

## 2018-10-29 DIAGNOSIS — R52 Pain, unspecified: Secondary | ICD-10-CM | POA: Diagnosis not present

## 2018-10-29 DIAGNOSIS — R1013 Epigastric pain: Secondary | ICD-10-CM | POA: Diagnosis not present

## 2018-10-29 DIAGNOSIS — F17210 Nicotine dependence, cigarettes, uncomplicated: Secondary | ICD-10-CM | POA: Diagnosis present

## 2018-10-29 DIAGNOSIS — K2970 Gastritis, unspecified, without bleeding: Secondary | ICD-10-CM | POA: Diagnosis present

## 2018-10-29 DIAGNOSIS — R7401 Elevation of levels of liver transaminase levels: Secondary | ICD-10-CM | POA: Diagnosis present

## 2018-10-29 DIAGNOSIS — R1010 Upper abdominal pain, unspecified: Secondary | ICD-10-CM | POA: Diagnosis not present

## 2018-10-29 DIAGNOSIS — E876 Hypokalemia: Secondary | ICD-10-CM | POA: Diagnosis present

## 2018-10-29 DIAGNOSIS — Z03818 Encounter for observation for suspected exposure to other biological agents ruled out: Secondary | ICD-10-CM | POA: Diagnosis not present

## 2018-10-29 LAB — CBC WITH DIFFERENTIAL/PLATELET
Abs Immature Granulocytes: 0.04 10*3/uL (ref 0.00–0.07)
Basophils Absolute: 0 10*3/uL (ref 0.0–0.1)
Basophils Relative: 0 %
Eosinophils Absolute: 0.1 10*3/uL (ref 0.0–0.5)
Eosinophils Relative: 1 %
HCT: 39.2 % (ref 36.0–46.0)
Hemoglobin: 12.8 g/dL (ref 12.0–15.0)
Immature Granulocytes: 0 %
Lymphocytes Relative: 7 %
Lymphs Abs: 0.7 10*3/uL (ref 0.7–4.0)
MCH: 26.4 pg (ref 26.0–34.0)
MCHC: 32.7 g/dL (ref 30.0–36.0)
MCV: 80.8 fL (ref 80.0–100.0)
Monocytes Absolute: 0.2 10*3/uL (ref 0.1–1.0)
Monocytes Relative: 1 %
Neutro Abs: 9.4 10*3/uL — ABNORMAL HIGH (ref 1.7–7.7)
Neutrophils Relative %: 91 %
Platelets: 310 10*3/uL (ref 150–400)
RBC: 4.85 MIL/uL (ref 3.87–5.11)
RDW: 15 % (ref 11.5–15.5)
WBC: 10.4 10*3/uL (ref 4.0–10.5)
nRBC: 0 % (ref 0.0–0.2)

## 2018-10-29 MED ORDER — ALUM & MAG HYDROXIDE-SIMETH 200-200-20 MG/5ML PO SUSP
30.0000 mL | Freq: Once | ORAL | Status: DC
  Filled 2018-10-29: qty 30

## 2018-10-29 MED ORDER — PANTOPRAZOLE SODIUM 40 MG IV SOLR
40.0000 mg | Freq: Once | INTRAVENOUS | Status: AC
  Administered 2018-10-29: 40 mg via INTRAVENOUS
  Filled 2018-10-29: qty 40

## 2018-10-29 MED ORDER — ONDANSETRON HCL 4 MG/2ML IJ SOLN
4.0000 mg | Freq: Once | INTRAMUSCULAR | Status: AC
  Administered 2018-10-29: 4 mg via INTRAVENOUS
  Filled 2018-10-29: qty 2

## 2018-10-29 MED ORDER — LIDOCAINE VISCOUS HCL 2 % MT SOLN
15.0000 mL | Freq: Once | OROMUCOSAL | Status: DC
  Filled 2018-10-29: qty 15

## 2018-10-29 MED ORDER — MORPHINE SULFATE (PF) 4 MG/ML IV SOLN
4.0000 mg | Freq: Once | INTRAVENOUS | Status: AC
  Administered 2018-10-29: 4 mg via INTRAVENOUS
  Filled 2018-10-29: qty 1

## 2018-10-29 NOTE — ED Provider Notes (Addendum)
[facility] EMERGENCY DEPARTMENT Provider Note   CSN: [id number] Arrival date & time: 10/29/18  2154  History   Chief Complaint Chief Complaint  Patient presents with  . Abdominal Pain   HPI Ruth Mullen is a 33 y.o. female with past medical history significant for hypertension, anxiety who presents for evaluation of abdominal pain and nausea. Patient states she has had intermittentepigastric pain since this morning.  States she took ibuprofen without relief of her symptoms.  Patient states she has had persistent nausea since this morning as well.  Patient states she does take ibuprofen frequently especially for her chronic dental pain as well as tylenol the last 3 days.  Denies any alcohol use or history of pancreatitis.  Not worse with food intake.  Pain is rated a 5/10.  Pain does not radiate.  Describes her pain as an aching sensation. Denies fever, chills, emesis, chest pain, shortness of breath, chance of pregnancy, dysuria, diarrhea, constipation, hematemesis, melena, hematochezia.  Denies additional aggravating or alleviating factors.  Abdominal surgeries--laparoscopic cholecystectomy 2007, with intraoperative cholangiogram showing no stone obstruction.  History obtained from patient and past medical records.  No interpreter was used.     HPI  Past Medical History:  Diagnosis Date  . Anxiety as acute reaction to exceptional stress   . BV (bacterial vaginosis) 09/07/2013  . Chlamydia   . Depression 12/06/2014  . Dysmenorrhea 11/19/2015  . Fatigue 06/28/2014  . 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 Hypertension   . Menorrhagia with regular cycle 11/19/2015  . Vaginal discharge 09/07/2013    Patient Active Problem List   Diagnosis Date Noted  . Encounter for initial prescription of contraceptive pills 02/01/2017  . Menorrhagia with regular cycle 11/19/2015  . Dysmenorrhea 11/19/2015  . Depression 12/06/2014  . Fatigue 06/28/2014  . Abnormal uterine bleeding (AUB)  01/15/2014  . Vaginal discharge 09/07/2013  . BV (bacterial vaginosis) 09/07/2013  . H/O cervical incompetence 07/18/2013  . Mild hypertension 07/18/2013  . 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 Marijuana use 01/02/2013    Past Surgical History:  Procedure Laterality Date  . CERVICAL CERCLAGE N/A 02/08/2013   Procedure: CERCLAGE CERVICAL MCDONALD;  Surgeon: Jonnie Kind, MD;  Location: [facility];  Service: Gynecology;  Laterality: N/A;  . CHOLECYSTECTOMY    . DILATION AND CURETTAGE OF UTERUS       OB History    Gravida  5   Para  2   Term  2   Preterm      AB  2   Living  2     SAB  1   TAB  1   Ectopic      Multiple      Live Births  2            Home Medications    Prior to Admission medications   Medication Sig Start Date End Date Taking? Authorizing Provider  HYDROcodone-acetaminophen (NORCO/VICODIN) 5-325 MG tablet Take 1 tablet by mouth every 4 (four) hours as needed for moderate pain. 10/10/18  Yes Pollina, Gwenyth Allegra, MD  ibuprofen (ADVIL,MOTRIN) 600 MG tablet Take 1 tablet (600 mg total) by mouth 4 (four) times daily. 05/12/18  Yes Lily Kocher, PA-C  metroNIDAZOLE (FLAGYL) 500 MG tablet Take 1 tablet (500 mg total) by mouth 2 (two) times daily. 10/10/18   Fredia Sorrow, MD  ondansetron (ZOFRAN) 4 MG tablet Take 1 tablet (4 mg total) by mouth every 6 (six) hours. Patient not  taking: Reported on 10/29/2018 10/10/18   Orpah Greek, MD  valACYclovir (VALTREX) 1000 MG tablet Take 1 tablet (1,000 mg total) by mouth daily. Patient taking differently: Take 1,000 mg by mouth daily as needed.  02/01/17   Estill Dooms, NP    Family History Family History  Problem Relation Age of Onset  . Hypertension Mother     Social History Social History   Tobacco Use  . Smoking status: Current Every Day Smoker    Packs/day: 0.50    Years: 1.00    Pack years: 0.50    Types: Cigarettes    Last attempt to quit: 09/21/2014    Years  since quitting: 4.1  . Smokeless tobacco: Never Used  Substance Use Topics  . Alcohol use: No  . Drug use: No     Allergies   Patient has no known allergies.   Review of Systems Review of Systems  Constitutional: Negative.   HENT: Negative.   Respiratory: Negative.   Cardiovascular: Negative.   Gastrointestinal: Positive for abdominal pain, nausea and vomiting. Negative for abdominal distention, anal bleeding, blood in stool, constipation, diarrhea and rectal pain.  Genitourinary: Negative.   Musculoskeletal: Negative.   Neurological: Negative.   All other systems reviewed and are negative.  Physical Exam Updated Vital Signs BP 129/86   Pulse 71   Temp 98.6 F (37 C) (Oral)   Resp 20   Ht 5' (1.524 m)   Wt 59.4 kg   LMP 10/03/2018   SpO2 100%   BMI 25.58 kg/m   Physical Exam Vitals signs and nursing note reviewed.  Constitutional:      General: She is not in acute distress.    Appearance: She is well-developed. She is not ill-appearing, toxic-appearing or diaphoretic.  HENT:     Head: Normocephalic and atraumatic.     Mouth/Throat:     Mouth: Mucous membranes are moist.     Pharynx: Oropharynx is clear.  Eyes:     Pupils: Pupils are equal, round, and reactive to light.  Neck:     Musculoskeletal: Normal range of motion.  Cardiovascular:     Rate and Rhythm: Normal rate.     Heart sounds: Normal heart sounds. No murmur. No gallop.   Pulmonary:     Effort: Pulmonary effort is normal. No respiratory distress.     Breath sounds: Normal breath sounds.  Abdominal:     General: There is no distension.     Palpations: Abdomen is soft.     Tenderness: There is abdominal tenderness in the epigastric area. There is no right CVA tenderness, left CVA tenderness, guarding or rebound. Negative signs include Murphy's sign.     Hernia: No hernia is present.     Comments: Soft without rebound or guarding.  Tenderness to epigastric region.  No overlying skin changes to  abdominal wall.  Negative Murphy sign.  No obvious abdominal wall herniations.  Musculoskeletal: Normal range of motion.     Comments: Moves all 4 extremities without difficulty.  Skin:    General: Skin is warm and dry.  Neurological:     Mental Status: She is alert.    ED Treatments / Results  Labs (all labs ordered are listed, but only abnormal results are displayed) Labs Reviewed  CBC WITH DIFFERENTIAL/PLATELET - Abnormal; Notable for the following components:      Result Value   Neutro Abs 9.4 (*)    All other components within normal limits  COMPREHENSIVE METABOLIC PANEL -  Abnormal; Notable for the following components:   Potassium 3.2 (*)    Calcium 8.7 (*)    AST 188 (*)    ALT 92 (*)    All other components within normal limits  URINE CULTURE  LIPASE, BLOOD  URINALYSIS, ROUTINE W REFLEX MICROSCOPIC  HCG, QUANTITATIVE, PREGNANCY  ACETAMINOPHEN LEVEL    EKG EKG Interpretation  Date/Time:  Saturday October 29 2018 22:14:34 EDT Ventricular Rate:  81 PR Interval:    QRS Duration: 91 QT Interval:  371 QTC Calculation: 431 R Axis:   80 Text Interpretation:  Sinus rhythm Confirmed by Milton Ferguson [phone]) on 10/29/2018 10:23:05 PM   Radiology No results found.  Procedures Procedures (including critical care time)  Medications Ordered in ED Medications  alum & mag hydroxide-simeth (MAALOX/MYLANTA) 200-200-20 MG/5ML suspension 30 mL (has no administration in time range)    And  lidocaine (XYLOCAINE) 2 % viscous mouth solution 15 mL (has no administration in time range)  morphine 4 MG/ML injection 4 mg (4 mg Intravenous Given 10/29/18 2307)  ondansetron (ZOFRAN) injection 4 mg (4 mg Intravenous Given 10/29/18 2306)  pantoprazole (PROTONIX) injection 40 mg (40 mg Intravenous Given 10/29/18 2333)   Initial Impression / Assessment and Plan / ED Course  I have reviewed the triage vital signs and the nursing notes.  Pertinent labs & imaging results that were available  during my care of the patient were reviewed by me and considered in my medical decision making (see chart for details).  33 year old female appears otherwise well presents for evaluation of epigastric abdominal pain and nausea.  Afebrile, nonseptic, non-ill-appearing.  Heart and lungs clear.  Does use NSAIDs frequently and Tylenol for dental pain however denies hematemesis or alcohol use.  No history of gastric ulcerations.  No evidence of upper GI.  Abdomen soft without rebound or guarding.  She does have epigastric tenderness.  She is s/p laparoscopic cholecystectomy.  She has negative Murphy sign.  I have low suspicion for retained stone.  Will obtain labs, provide IV fluids, antiemetics.  DDX; Ulcer, gastritis, GI bleed, appendicitis,pancreatitis, Reflux, bowel obstruction, bowel perforation, cholecystitis, diverticulitis, PID or ectopic pregnancy.   Nursing has notified me that patient is actively vomiting nonbilious, nonbloody emesis.  Will provide Zofran prior to GI cocktail.  CBC without leukocytosis, hemoglobin 51.8 Metabolic panel with mild hypo-kalemia at 3.2, elevated AST at 188, ALT at 92, no elevation in alk phos or bilirubin.  Pending hcg, urinalysis,tylenol level   Care transferred at shift change to Dr. Tomi Bamberger who will determine ultimate treatment, plan and disposition.     Final Clinical Impressions(s) / ED Diagnoses   Final diagnoses:  Epigastric pain    ED Discharge Orders    None       Darren Caldron A, PA-C 10/30/18 0015    Pamlea Finder A, PA-C 10/30/18 0029    Milton Ferguson, MD 10/30/18 1029

## 2018-10-29 NOTE — ED Triage Notes (Signed)
Pt in by EMS with IV  She is normotensive   Sent to triage due to no beds  Is to be triaged and stay in back waiting area until bed available per CN

## 2018-10-29 NOTE — ED Notes (Signed)
EKG done and seen by Dr Roderic Palau. Patient placed in gown and on 12 lead

## 2018-10-30 ENCOUNTER — Emergency Department (HOSPITAL_COMMUNITY): Payer: Medicaid Other

## 2018-10-30 ENCOUNTER — Other Ambulatory Visit: Payer: Self-pay

## 2018-10-30 ENCOUNTER — Encounter (HOSPITAL_COMMUNITY): Payer: Self-pay

## 2018-10-30 DIAGNOSIS — R740 Nonspecific elevation of levels of transaminase and lactic acid dehydrogenase [LDH]: Secondary | ICD-10-CM

## 2018-10-30 DIAGNOSIS — R7401 Elevation of levels of liver transaminase levels: Secondary | ICD-10-CM | POA: Diagnosis present

## 2018-10-30 LAB — COMPREHENSIVE METABOLIC PANEL
ALT: 92 U/L — ABNORMAL HIGH (ref 0–44)
AST: 188 U/L — ABNORMAL HIGH (ref 15–41)
Albumin: 4 g/dL (ref 3.5–5.0)
Alkaline Phosphatase: 62 U/L (ref 38–126)
Anion gap: 13 (ref 5–15)
BUN: 8 mg/dL (ref 6–20)
CO2: 22 mmol/L (ref 22–32)
Calcium: 8.7 mg/dL — ABNORMAL LOW (ref 8.9–10.3)
Chloride: 105 mmol/L (ref 98–111)
Creatinine, Ser: 0.49 mg/dL (ref 0.44–1.00)
GFR calc Af Amer: >60 mL/min (ref >60)
GFR calc non Af Amer: >60 mL/min (ref >60)
Glucose, Bld: 94 mg/dL (ref 70–99)
Potassium: 3.2 mmol/L — ABNORMAL LOW (ref 3.5–5.1)
Sodium: 140 mmol/L (ref 135–145)
Total Bilirubin: 1.2 mg/dL (ref 0.3–1.2)
Total Protein: 7.3 g/dL (ref 6.5–8.1)

## 2018-10-30 LAB — URINALYSIS, ROUTINE W REFLEX MICROSCOPIC
Bilirubin Urine: NEGATIVE
Glucose, UA: NEGATIVE mg/dL
Ketones, ur: NEGATIVE mg/dL
Nitrite: NEGATIVE
Protein, ur: 30 mg/dL — AB
Specific Gravity, Urine: 1.023 (ref 1.005–1.030)
pH: 5 (ref 5.0–8.0)

## 2018-10-30 LAB — SALICYLATE LEVEL: Salicylate Lvl: <7 mg/dL (ref 2.8–30.0)

## 2018-10-30 LAB — RAPID URINE DRUG SCREEN, HOSP PERFORMED
Amphetamines: NOT DETECTED
Barbiturates: NOT DETECTED
Benzodiazepines: NOT DETECTED
Cocaine: NOT DETECTED
Opiates: POSITIVE — AB
Tetrahydrocannabinol: POSITIVE — AB

## 2018-10-30 LAB — LIPASE, BLOOD: Lipase: 27 U/L (ref 11–51)

## 2018-10-30 LAB — HCG, QUANTITATIVE, PREGNANCY: hCG, Beta Chain, Quant, S: <1 m[IU]/mL (ref <5)

## 2018-10-30 LAB — ACETAMINOPHEN LEVEL: Acetaminophen (Tylenol), Serum: <10 ug/mL — ABNORMAL LOW (ref 10–30)

## 2018-10-30 LAB — PROTIME-INR
INR: 1.2 (ref 0.8–1.2)
Prothrombin Time: 14.6 seconds (ref 11.4–15.2)

## 2018-10-30 LAB — APTT: aPTT: 27 seconds (ref 24–36)

## 2018-10-30 LAB — CK: Total CK: 67 U/L (ref 38–234)

## 2018-10-30 LAB — SARS CORONAVIRUS 2 BY RT PCR (HOSPITAL ORDER, PERFORMED IN ~~LOC~~ HOSPITAL LAB): SARS Coronavirus 2: NEGATIVE

## 2018-10-30 MED ORDER — PANTOPRAZOLE SODIUM 40 MG PO TBEC
40.0000 mg | DELAYED_RELEASE_TABLET | Freq: Every day | ORAL | Status: DC
  Administered 2018-10-30 – 2018-11-01 (×3): 40 mg via ORAL
  Filled 2018-10-30 (×3): qty 1

## 2018-10-30 MED ORDER — ACETYLCYSTEINE LOAD VIA INFUSION
150.0000 mg/kg | Freq: Once | INTRAVENOUS | Status: AC
  Administered 2018-10-30: 8910 mg via INTRAVENOUS
  Filled 2018-10-30: qty 223

## 2018-10-30 MED ORDER — ONDANSETRON HCL 4 MG PO TABS: 4.0000 mg | ORAL_TABLET | Freq: Four times a day (QID) | ORAL | Status: DC | PRN

## 2018-10-30 MED ORDER — OXYCODONE HCL 5 MG PO TABS: 5.0000 mg | ORAL_TABLET | Freq: Four times a day (QID) | ORAL | Status: DC | PRN

## 2018-10-30 MED ORDER — DEXTROSE 5 % IV SOLN
15.0000 mg/kg/h | INTRAVENOUS | Status: DC
  Administered 2018-10-30 – 2018-10-31 (×2): 15 mg/kg/h via INTRAVENOUS
  Filled 2018-10-30: qty 120
  Filled 2018-10-30 (×3): qty 150

## 2018-10-30 MED ORDER — ACETYLCYSTEINE 200 MG/ML IV SOLN
INTRAVENOUS | Status: AC
  Filled 2018-10-30: qty 150

## 2018-10-30 MED ORDER — SODIUM CHLORIDE 0.9% FLUSH
3.0000 mL | Freq: Two times a day (BID) | INTRAVENOUS | Status: DC
  Administered 2018-10-31: 3 mL via INTRAVENOUS

## 2018-10-30 MED ORDER — POTASSIUM CHLORIDE CRYS ER 20 MEQ PO TBCR
40.0000 meq | EXTENDED_RELEASE_TABLET | Freq: Once | ORAL | Status: AC
  Administered 2018-10-30: 40 meq via ORAL
  Filled 2018-10-30: qty 2

## 2018-10-30 MED ORDER — MAGIC MOUTHWASH W/LIDOCAINE
5.0000 mL | Freq: Four times a day (QID) | ORAL | Status: DC | PRN
  Filled 2018-10-30: qty 5

## 2018-10-30 MED ORDER — ENOXAPARIN SODIUM 40 MG/0.4ML ~~LOC~~ SOLN
40.0000 mg | SUBCUTANEOUS | Status: DC
  Administered 2018-10-30: 40 mg via SUBCUTANEOUS
  Filled 2018-10-30 (×2): qty 0.4

## 2018-10-30 MED ORDER — ALUM & MAG HYDROXIDE-SIMETH 200-200-20 MG/5ML PO SUSP: 15.0000 mL | ORAL | Status: DC | PRN

## 2018-10-30 MED ORDER — ONDANSETRON HCL 4 MG/2ML IJ SOLN: 4.0000 mg | Freq: Four times a day (QID) | INTRAMUSCULAR | Status: DC | PRN

## 2018-10-30 MED ORDER — SODIUM CHLORIDE 0.9 % IV SOLN: 250.0000 mL | INTRAVENOUS | Status: DC | PRN

## 2018-10-30 MED ORDER — SODIUM CHLORIDE 0.9% FLUSH: 3.0000 mL | INTRAVENOUS | Status: DC | PRN

## 2018-10-30 NOTE — Progress Notes (Signed)
Pharmacy consulted for possible acetaminophen overdose. Per Poison Control contacted by RN, start N-acetylcysteine IV due to elevated LFTs. Medication order placed for N-acetylcysteine at recommended dose (150 mg/kg IV x1 followed by 15 mg/kg/hr). Recommended salicylate level and urine tox screen. Will follow-up with NAC administration and lab orders.   Ruth Mullen Ruth Mullen 10/30/2018 6:04 AM

## 2018-10-30 NOTE — ED Notes (Signed)
Pt refused to be swabbed for SARS Coronavirus 2. Educated pt on the importance of swabbing to protect staff and herself. Pt still refused. Pt was on the phone with her mama crying stating "I ain't gonna let this lady stick this shit up my nose to test me for covid because I haven't been anywhere."

## 2018-10-30 NOTE — ED Provider Notes (Signed)
Patient was left at change of shift.  At that time she had some elevated LFTs.  The PA informed me that patient had been taking extra Tylenol due to a tooth ache.  Tylenol level was sent and patient was discussed with poison control.  They recommended starting patient on Mucomyst.  4:30 AM when I talked to the patient she states she has been having pain like her pain she had before she had her gallbladder removed 13 years ago.  She states however the last 24 hours is gotten a lot worse.  She denies taking any extra Tylenol.  She states she has been taking ibuprofen 600 mg.  She is very specific about that.  We discussed getting an ultrasound of her abdomen this morning to look for retained stone.  I am going to re-talk to poison control to see if we need to continue the Mucomyst.  04:44 AM Poison Control mucomyst won't hurt her liver until we discover what is causing the elevation of her LFT's.  Recommends admission for Mucomyst if her ultrasound does not reveal the cause of her elevated LFTs.  Patient's Mucomyst is being given IV so that will not interfere with her getting her ultrasound this morning.   EKG Interpretation  Date/Time:  Sunday October 30 2018 06:16:43 EDT Ventricular Rate:  54 PR Interval:    QRS Duration: 83 QT Interval:  422 QTC Calculation: 400 R Axis:   73 Text Interpretation:  Sinus rhythm No significant change since last tracing yesterday Confirmed by Rolland Porter [phone]) on 10/30/2018 8:24:32 AM      Pt waiting to get [place] RUQ done this am.      Rolland Porter, MD 10/30/18 [phone]

## 2018-10-30 NOTE — ED Notes (Signed)
Patient resting on stretcher with eyes closed at this time. Patient states that she is not having any pain at this time.

## 2018-10-30 NOTE — ED Notes (Signed)
Spoke with Poison Control about elevated liver enzymes per Dr. Johnsie Kindred request. Poison Control stated that since AST is higher than the ALT there is a possibility of rhabdo, so we would need to do further testing to rule out other causes. Stated that we should also treat with N-acetylcysteine at 150mg /kg as a loading dose, then 15mg /kg/hr as a maintenance dose for 24 hours. Two hours before the medication is finished there should be a repeat CMET to check that the liver enzymes are trending down. EDP aware

## 2018-10-30 NOTE — H&P (Addendum)
History and Physical    Ruth HammersmithBrittaney N Khiev ZOX:[id number] DOB: [birth year] DOA: 10/29/2018  [facility]   Patient coming from: Home  Chief Complaint: Epigastric abdominal pain  HPI: Ruth Mullen is a 33 y.o. female with medical history significant for some mild anxiety and possible hypertension who has been struggling with some lower dental pain over the last 3 days as well as some swelling.  She states that she needs her tooth pulled and has been trying to go to dentist but came to the ED several days ago and received some clindamycin as well as ibuprofen that she has been on.  She started to develop some intermittent epigastric pain as well as some nausea which prompted her to come back to the ED on 7/4.  She denies any alcohol use currently and states that the pain is not worse with food intake.  She denies any radiation of the pain, nor does she have fevers, chills, emesis, constipation, or diarrhea.  She did have some mild nausea, but not currently.  She has had prior laparoscopic cholecystectomy in 2007.   ED Course: Patient was noted to have mild hypokalemia with potassium 3.2 as well as elevated AST of 188 and ALT of 92.  EDP had noted that patient was taking some extra Tylenol due to her toothache at home and had discussed the case with poison control who had recommended starting the patient on Mucomyst even prior to a Tylenol level that was obtained.  He is determined that she will need to finish a 24-hour course of Mucomyst further recommendations as right upper quadrant abdominal ultrasound has not revealed any significant findings.  This is despite her Tylenol level being low.  Poison control was recommended repeat a.m. labs to ensure that levels are stabilizing prior to discharge home.  She has been given GI cocktail, some Zofran, Protonix IV with some minimal improvement in her symptoms noted.  Review of Systems: All others reviewed and otherwise negative.   Past Medical History:  Diagnosis Date  . Anxiety as acute reaction to exceptional stress   . BV (bacterial vaginosis) 09/07/2013  . Chlamydia   . Depression 12/06/2014  . Dysmenorrhea 11/19/2015  . Fatigue 06/28/2014  . 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 Hypertension   . Menorrhagia with regular cycle 11/19/2015  . Vaginal discharge 09/07/2013    Past Surgical History:  Procedure Laterality Date  . CERVICAL CERCLAGE N/A 02/08/2013   Procedure: CERCLAGE CERVICAL MCDONALD;  Surgeon: Tilda BurrowJohn V Ferguson, MD;  Location: [facility];  Service: Gynecology;  Laterality: N/A;  . CHOLECYSTECTOMY    . DILATION AND CURETTAGE OF UTERUS       reports that she has been smoking cigarettes. She has a 0.50 pack-year smoking history. She has never used smokeless tobacco. She reports that she does not drink alcohol or use drugs.  No Known Allergies  Family History  Problem Relation Age of Onset  . Hypertension Mother     Prior to Admission medications   Medication Sig Start Date End Date Taking? Authorizing Provider  HYDROcodone-acetaminophen (NORCO/VICODIN) 5-325 MG tablet Take 1 tablet by mouth every 4 (four) hours as needed for moderate pain. 10/10/18  Yes Pollina, Canary Brimhristopher J, MD  ibuprofen (ADVIL,MOTRIN) 600 MG tablet Take 1 tablet (600 mg total) by mouth 4 (four) times daily. 05/12/18  Yes Ivery QualeBryant, Hobson, PA-C  metroNIDAZOLE (FLAGYL) 500 MG tablet Take 1 tablet (500 mg total) by mouth 2 (two) times daily. 10/10/18  Vanetta MuldersZackowski, Scott, MD  ondansetron (ZOFRAN) 4 MG tablet Take 1 tablet (4 mg total) by mouth every 6 (six) hours. Patient not taking: Reported on 10/29/2018 10/10/18   Gilda CreasePollina, Christopher J, MD  valACYclovir (VALTREX) 1000 MG tablet Take 1 tablet (1,000 mg total) by mouth daily. Patient taking differently: Take 1,000 mg by mouth daily as needed.  02/01/17   Adline PotterGriffin, Jennifer A, NP    Physical Exam: Vitals:   10/30/18 0618 10/30/18 0635 10/30/18 0700 10/30/18 0800  BP: 117/76 130/83 140/88 (!)  143/93  Pulse: 60 (!) 56 (!) 54 85  Resp: 18 14 12 15   Temp:    98.6 F (37 C)  TempSrc:      SpO2: 100% 100% 100% 96%  Weight:      Height:        Constitutional: NAD, calm, comfortable Vitals:   10/30/18 0618 10/30/18 0635 10/30/18 0700 10/30/18 0800  BP: 117/76 130/83 140/88 (!) 143/93  Pulse: 60 (!) 56 (!) 54 85  Resp: 18 14 12 15   Temp:    98.6 F (37 C)  TempSrc:      SpO2: 100% 100% 100% 96%  Weight:      Height:       Eyes: lids and conjunctivae normal ENMT: Mucous membranes are moist.  Neck: normal, supple Respiratory: clear to auscultation bilaterally. Normal respiratory effort. No accessory muscle use.  Cardiovascular: Regular rate and rhythm, no murmurs. No extremity edema. Abdomen: no tenderness, no distention. Bowel sounds positive.  Musculoskeletal:  No joint deformity upper and lower extremities.   Skin: no rashes, lesions, ulcers.  Psychiatric: Normal judgment and insight. Alert and oriented x 3. Normal mood.   Labs on Admission: I have personally reviewed following labs and imaging studies  CBC: Recent Labs  Lab 10/29/18 2255  WBC 10.4  NEUTROABS 9.4*  HGB 12.8  HCT 39.2  MCV 80.8  PLT 310   Basic Metabolic Panel: Recent Labs  Lab 10/29/18 2255  NA 140  K 3.2*  CL 105  CO2 22  GLUCOSE 94  BUN 8  CREATININE 0.49  CALCIUM 8.7*   GFR: Estimated Creatinine Clearance: 82.9 mL/min (by C-G formula based on SCr of 0.49 mg/dL). Liver Function Tests: Recent Labs  Lab 10/29/18 2255  AST 188*  ALT 92*  ALKPHOS 62  BILITOT 1.2  PROT 7.3  ALBUMIN 4.0   Recent Labs  Lab 10/29/18 2255  LIPASE 27   No results for input(s): AMMONIA in the last 168 hours. Coagulation Profile: Recent Labs  Lab 10/30/18 0100  INR 1.2   Cardiac Enzymes: Recent Labs  Lab 10/30/18 0100  CKTOTAL 67   BNP (last 3 results) No results for input(s): PROBNP in the last 8760 hours. HbA1C: No results for input(s): HGBA1C in the last 72 hours. CBG: No  results for input(s): GLUCAP in the last 168 hours. Lipid Profile: No results for input(s): CHOL, HDL, LDLCALC, TRIG, CHOLHDL, LDLDIRECT in the last 72 hours. Thyroid Function Tests: No results for input(s): TSH, T4TOTAL, FREET4, T3FREE, THYROIDAB in the last 72 hours. Anemia Panel: No results for input(s): VITAMINB12, FOLATE, FERRITIN, TIBC, IRON, RETICCTPCT in the last 72 hours. Urine analysis:    Component Value Date/Time   COLORURINE YELLOW 10/30/2018 0022   APPEARANCEUR HAZY (A) 10/30/2018 0022   LABSPEC 1.023 10/30/2018 0022   PHURINE 5.0 10/30/2018 0022   GLUCOSEU NEGATIVE 10/30/2018 0022   HGBUR SMALL (A) 10/30/2018 0022   BILIRUBINUR NEGATIVE 10/30/2018 0022   KETONESUR NEGATIVE  10/30/2018 0022   PROTEINUR 30 (A) 10/30/2018 0022   UROBILINOGEN 0.2 05/01/2013 1320   NITRITE NEGATIVE 10/30/2018 0022   LEUKOCYTESUR MODERATE (A) 10/30/2018 0022    Radiological Exams on Admission: US Abdomen Limited Ruq  Result Date: 10/30/2018 CLINICAL DATA:  Right upper quadrant pain with elevated LFTs. Prior cholecystectomy. EXAM: ULTRASOUND ABDOMEN LIMITED RIGHT UPPER QUADRANT COMPARISON:  CT abdomen pelvis dated October 10, 2018. FINDINGS: Gallbladder: Surgically absent. Common bile duct: Diameter: 3 mm, normal. Liver: No focal lesion identified. Within normal limits in parenchymal echogenicity. Portal vein is patent on color Doppler imaging with normal direction of blood flow towards the liver. IMPRESSION: 1. Prior cholecystectomy. Otherwise unremarkable right upper quadrant ultrasound. Electronically Signed   By: Titus Dubin M.D.   On: 10/30/2018 09:52    EKG: Independently reviewed. 54bpm, SR.  Assessment/Plan Active Problems:   Transaminitis   Mild gastritis associated with mild associated transaminitis -Doubtful if patient actually took increased doses of Tylenol which would have contributed to this -I believe that the clindamycin and ibuprofen are contributing to the mild LFT  elevation -We will continue Mucomyst protocol as recommended by poison control repeat labs in a.m. -Maintain on Protonix as well as Maalox as needed -Start diet -Maintain on Zofran as needed for nausea or vomiting   Toothache -Avoid further NSAIDs and Tylenol and use low-dose oral narcotics as needed for pain control -No further clindamycin indicated   DVT prophylaxis: Lovenox Code Status: Full Family Communication:None at bedside  Disposition Plan:Admit for mucomyst and recheck LFTs in am; anticipate DC in 24 hours Consults called:None Admission status: Obs, MedSurg   Nazli Penn Darleen Crocker DO [facility] Pager [phone]  If 7PM-7AM, please contact night-coverage [email or web address] Password [email or web address]  10/30/2018, 11:43 AM

## 2018-10-30 NOTE — ED Notes (Signed)
Spoke to [employer] at [employer]. Recommended that patient have liver enzymes rechecked at 0500 tomorrow morning.

## 2018-10-30 NOTE — ED Notes (Signed)
Spoke with radiology [place] will be in at 0900.

## 2018-10-30 NOTE — ED Provider Notes (Signed)
Pt received at sign out with [place] RUQ and call for admission pending. See previous EDP notes for full H&P/HPI/MDM. IV mucomyst infusing d/t concern over LFT elevation and APAP use. Poison Control recommended admission for full treatment and repeat LFT's tomorrow morning. RUQ [place] reassuring.  1055: T/C returned from [facility]. Manuella Ghazi, case discussed, including:  HPI, pertinent PM/SHx, VS/PE, dx testing, ED course and treatment:  Agreeable to admit.        Francine Graven, DO 10/30/18 1117

## 2018-10-31 DIAGNOSIS — E876 Hypokalemia: Secondary | ICD-10-CM | POA: Diagnosis not present

## 2018-10-31 DIAGNOSIS — R1013 Epigastric pain: Secondary | ICD-10-CM | POA: Diagnosis not present

## 2018-10-31 DIAGNOSIS — T391X1A Poisoning by 4-Aminophenol derivatives, accidental (unintentional), initial encounter: Secondary | ICD-10-CM | POA: Diagnosis not present

## 2018-10-31 DIAGNOSIS — I10 Essential (primary) hypertension: Secondary | ICD-10-CM | POA: Diagnosis not present

## 2018-10-31 DIAGNOSIS — F17210 Nicotine dependence, cigarettes, uncomplicated: Secondary | ICD-10-CM | POA: Diagnosis not present

## 2018-10-31 DIAGNOSIS — Z1159 Encounter for screening for other viral diseases: Secondary | ICD-10-CM | POA: Diagnosis not present

## 2018-10-31 DIAGNOSIS — K2970 Gastritis, unspecified, without bleeding: Secondary | ICD-10-CM | POA: Diagnosis not present

## 2018-10-31 DIAGNOSIS — K0889 Other specified disorders of teeth and supporting structures: Secondary | ICD-10-CM | POA: Diagnosis not present

## 2018-10-31 DIAGNOSIS — Z8249 Family history of ischemic heart disease and other diseases of the circulatory system: Secondary | ICD-10-CM | POA: Diagnosis not present

## 2018-10-31 LAB — URINE CULTURE: Culture: <10000 — AB

## 2018-10-31 LAB — COMPREHENSIVE METABOLIC PANEL
ALT: 138 U/L — ABNORMAL HIGH (ref 0–44)
AST: 72 U/L — ABNORMAL HIGH (ref 15–41)
Albumin: 3.3 g/dL — ABNORMAL LOW (ref 3.5–5.0)
Alkaline Phosphatase: 59 U/L (ref 38–126)
Anion gap: 9 (ref 5–15)
BUN: 5 mg/dL — ABNORMAL LOW (ref 6–20)
CO2: 23 mmol/L (ref 22–32)
Calcium: 8.5 mg/dL — ABNORMAL LOW (ref 8.9–10.3)
Chloride: 105 mmol/L (ref 98–111)
Creatinine, Ser: 0.56 mg/dL (ref 0.44–1.00)
GFR calc Af Amer: >60 mL/min (ref >60)
GFR calc non Af Amer: >60 mL/min (ref >60)
Glucose, Bld: 91 mg/dL (ref 70–99)
Potassium: 3.1 mmol/L — ABNORMAL LOW (ref 3.5–5.1)
Sodium: 137 mmol/L (ref 135–145)
Total Bilirubin: 0.7 mg/dL (ref 0.3–1.2)
Total Protein: 6.3 g/dL — ABNORMAL LOW (ref 6.5–8.1)

## 2018-10-31 LAB — MAGNESIUM: Magnesium: 1.8 mg/dL (ref 1.7–2.4)

## 2018-10-31 MED ORDER — DEXTROSE 5 % IV SOLN: 15.0000 mg/kg/h | INTRAVENOUS | Status: DC

## 2018-10-31 MED ORDER — POTASSIUM CHLORIDE CRYS ER 20 MEQ PO TBCR
40.0000 meq | EXTENDED_RELEASE_TABLET | Freq: Two times a day (BID) | ORAL | Status: DC
  Administered 2018-10-31 – 2018-11-01 (×3): 40 meq via ORAL
  Filled 2018-10-31 (×3): qty 2

## 2018-10-31 NOTE — Progress Notes (Signed)
Poison control called. Update given. They asked for me to draw LFTs two hours before acetylcysteine done.  Will keep an eye on drip and place orders when needed.  Will call Poison Control back after labs are resulted

## 2018-10-31 NOTE — Progress Notes (Signed)
PROGRESS NOTE    Ruth Mullen  RSW:[id number] DOB: 13-Sep-1985 DOA: 10/29/2018 PCP: [facility] [place]   Brief Narrative:  Per HPI:  Ruth Mullen is a 33 y.o. female with medical history significant for some mild anxiety and possible hypertension who has been struggling with some lower dental pain over the last 3 days as well as some swelling.  She states that she needs her tooth pulled and has been trying to go to dentist but came to the ED several days ago and received some clindamycin as well as ibuprofen that she has been on.  She started to develop some intermittent epigastric pain as well as some nausea which prompted her to come back to the ED on 7/4.  She denies any alcohol use currently and states that the pain is not worse with food intake.  She denies any radiation of the pain, nor does she have fevers, chills, emesis, constipation, or diarrhea.  She did have some mild nausea, but not currently.  She has had prior laparoscopic cholecystectomy in 2007.  Patient was admitted with transaminitis likely secondary to Tylenol overdose as well as some mild associated gastritis.  She has remained on IV Mucomyst infusion per poison control recommendations.  Assessment & Plan:   Active Problems:   Transaminitis   Mild transaminitis likely secondary to Tylenol overdose as well as mild gastritis -We will continue Mucomyst protocol as recommended by poison control repeat labs in a.m. -Maintain on Protonix as well as Maalox as needed -Continue current diet -Maintain on Zofran as needed for nausea or vomiting -Repeat INR in a.m.   Toothache-stable -Avoid further NSAIDs and Tylenol and use low-dose oral narcotics as needed for pain control -No further clindamycin indicated   DVT prophylaxis: Lovenox Code Status: Full Family Communication: None at bedside Disposition Plan: Continue current management with IV Mucomyst and recheck LFTs in a.m. per poison control  recommendations.  Patient requires inpatient stay due to need for IV Mucomyst antidote.  Consultants:   None  Procedures:   None  Antimicrobials:   None   Subjective: Patient seen and evaluated today with no new acute complaints or concerns. No acute concerns or events noted overnight.  She denies any nausea or vomiting and has very mild epigastric abdominal pain this morning.  Objective: Vitals:   10/30/18 1245 10/30/18 1454 10/30/18 2134 10/31/18 0535  BP: 138/67 (!) 145/61 116/63 115/62  Pulse: 60 (!) 56 (!) 55 64  Resp: 14 14 18 14   Temp: 98.2 F (36.8 C)  98.3 F (36.8 C) 98.2 F (36.8 C)  TempSrc:   Oral Oral  SpO2: 100% 100% 100% 100%  Weight:      Height:        Intake/Output Summary (Last 24 hours) at 10/31/2018 0917 Last data filed at 10/31/2018 0600 Gross per 24 hour  Intake 971.72 ml  Output -  Net 971.72 ml   Filed Weights   10/29/18 2216  Weight: 59.4 kg    Examination:  General exam: Appears calm and comfortable  Respiratory system: Clear to auscultation. Respiratory effort normal. Cardiovascular system: S1 & S2 heard, RRR. No JVD, murmurs, rubs, gallops or clicks. No pedal edema. Gastrointestinal system: Abdomen is nondistended, soft and nontender. No organomegaly or masses felt. Normal bowel sounds heard. Central nervous system: Alert and oriented. No focal neurological deficits. Extremities: Symmetric 5 x 5 power. Skin: No rashes, lesions or ulcers Psychiatry: Judgement and insight appear normal. Mood & affect appropriate.  Data Reviewed: I have personally reviewed following labs and imaging studies  CBC: Recent Labs  Lab 10/29/18 2255  WBC 10.4  NEUTROABS 9.4*  HGB 12.8  HCT 39.2  MCV 80.8  PLT 310   Basic Metabolic Panel: Recent Labs  Lab 10/29/18 2255 10/31/18 0419  NA 140 137  K 3.2* 3.1*  CL 105 105  CO2 22 23  GLUCOSE 94 91  BUN 8 5*  CREATININE 0.49 0.56  CALCIUM 8.7* 8.5*  MG  --  1.8   GFR: Estimated  Creatinine Clearance: 82.9 mL/min (by C-G formula based on SCr of 0.56 mg/dL). Liver Function Tests: Recent Labs  Lab 10/29/18 2255 10/31/18 0419  AST 188* 72*  ALT 92* 138*  ALKPHOS 62 59  BILITOT 1.2 0.7  PROT 7.3 6.3*  ALBUMIN 4.0 3.3*   Recent Labs  Lab 10/29/18 2255  LIPASE 27   No results for input(s): AMMONIA in the last 168 hours. Coagulation Profile: Recent Labs  Lab 10/30/18 0100  INR 1.2   Cardiac Enzymes: Recent Labs  Lab 10/30/18 0100  CKTOTAL 67   BNP (last 3 results) No results for input(s): PROBNP in the last 8760 hours. HbA1C: No results for input(s): HGBA1C in the last 72 hours. CBG: No results for input(s): GLUCAP in the last 168 hours. Lipid Profile: No results for input(s): CHOL, HDL, LDLCALC, TRIG, CHOLHDL, LDLDIRECT in the last 72 hours. Thyroid Function Tests: No results for input(s): TSH, T4TOTAL, FREET4, T3FREE, THYROIDAB in the last 72 hours. Anemia Panel: No results for input(s): VITAMINB12, FOLATE, FERRITIN, TIBC, IRON, RETICCTPCT in the last 72 hours. Sepsis Labs: No results for input(s): PROCALCITON, LATICACIDVEN in the last 168 hours.  Recent Results (from the past 240 hour(s))  Urine culture     Status: Abnormal   Collection Time: 10/30/18 12:22 AM   Specimen: Urine, Clean Catch  Result Value Ref Range Status   Specimen Description   Final    URINE, CLEAN CATCH Performed at [facility], [address] [place], [place] [address]    Special Requests   Final    NONE Performed at [facility], [address] [place], [place] [address]    Culture (A)  Final    <10,000 COLONIES/mL INSIGNIFICANT GROWTH Performed at [facility], [address] [place], [place] [address]    Report Status 10/31/2018 FINAL  Final  SARS Coronavirus 2 (CEPHEID - Performed in [facility] hospital lab), Hosp Order     Status: None   Collection Time: 10/30/18  9:07 AM   Specimen: Nasopharyngeal Swab  Result Value Ref Range Status   SARS  Coronavirus 2 NEGATIVE NEGATIVE Final    Comment: (NOTE) If result is NEGATIVE SARS-CoV-2 target nucleic acids are NOT DETECTED. The SARS-CoV-2 RNA is generally detectable in upper and lower  respiratory specimens during the acute phase of infection. The lowest  concentration of SARS-CoV-2 viral copies this assay can detect is 250  copies / mL. A negative result does not preclude SARS-CoV-2 infection  and should not be used as the sole basis for treatment or other  patient management decisions.  A negative result may occur with  improper specimen collection / handling, submission of specimen other  than nasopharyngeal swab, presence of viral mutation(s) within the  areas targeted by this assay, and inadequate number of viral copies  (<250 copies / mL). A negative result must be combined with clinical  observations, patient history, and epidemiological information. If result is POSITIVE SARS-CoV-2  target nucleic acids are DETECTED. The SARS-CoV-2 RNA is generally detectable in upper and lower  respiratory specimens dur ing the acute phase of infection.  Positive  results are indicative of active infection with SARS-CoV-2.  Clinical  correlation with patient history and other diagnostic information is  necessary to determine patient infection status.  Positive results do  not rule out bacterial infection or co-infection with other viruses. If result is PRESUMPTIVE POSTIVE SARS-CoV-2 nucleic acids MAY BE PRESENT.   A presumptive positive result was obtained on the submitted specimen  and confirmed on repeat testing.  While 2019 novel coronavirus  (SARS-CoV-2) nucleic acids may be present in the submitted sample  additional confirmatory testing may be necessary for epidemiological  and / or clinical management purposes  to differentiate between  SARS-CoV-2 and other Sarbecovirus currently known to infect humans.  If clinically indicated additional testing with an alternate test   methodology [id number]) is advised. The SARS-CoV-2 RNA is generally  detectable in upper and lower respiratory sp ecimens during the acute  phase of infection. The expected result is Negative. Fact Sheet for Patients:  BoilerBrush.com.cyhttps://www.fda.gov/media/136312/download Fact Sheet for Healthcare Providers: https://pope.com/https://www.fda.gov/media/136313/download This test is not yet approved or cleared by the [place] FDA and has been authorized for detection and/or diagnosis of SARS-CoV-2 by FDA under an Emergency Use Authorization (EUA).  This EUA will remain in effect (meaning this test can be used) for the duration of the COVID-19 declaration under Section 564(b)(1) of the Act, 21 U.S.C. section 360bbb-3(b)(1), unless the authorization is terminated or revoked sooner. Performed at [facility], [address] [place], [place] [address]          Radiology Studies: Koreas Abdomen Limited Ruq  Result Date: 10/30/2018 CLINICAL DATA:  Right upper quadrant pain with elevated LFTs. Prior cholecystectomy. EXAM: ULTRASOUND ABDOMEN LIMITED RIGHT UPPER QUADRANT COMPARISON:  CT abdomen pelvis dated October 10, 2018. FINDINGS: Gallbladder: Surgically absent. Common bile duct: Diameter: 3 mm, normal. Liver: No focal lesion identified. Within normal limits in parenchymal echogenicity. Portal vein is patent on color Doppler imaging with normal direction of blood flow towards the liver. IMPRESSION: 1. Prior cholecystectomy. Otherwise unremarkable right upper quadrant ultrasound. Electronically Signed   By: Obie DredgeWilliam T Derry M.D.   On: 10/30/2018 09:52        Scheduled Meds: . enoxaparin (LOVENOX) injection  40 mg Subcutaneous Q24H  . pantoprazole  40 mg Oral Daily  . potassium chloride  40 mEq Oral BID  . sodium chloride flush  3 mL Intravenous Q12H   Continuous Infusions: . sodium chloride    . acetylcysteine 15 mg/kg/hr (10/30/18 1500)     LOS: 0 days    Time spent: 30 minutes    Makaylin Carlo Hoover BrunetteD Masaru Chamberlin, DO [facility] Pager [phone]  If 7PM-7AM, please contact night-coverage [email or web address] Password [email or web address] 10/31/2018, 9:17 AM

## 2018-10-31 NOTE — Progress Notes (Signed)
Lennette Bihari from [employer] called at this time to update on lab results.  Recommending an additional 23 hours of maintenance acetylcysteine dose.

## 2018-11-01 LAB — COMPREHENSIVE METABOLIC PANEL
ALT: 98 U/L — ABNORMAL HIGH (ref 0–44)
AST: 27 U/L (ref 15–41)
Albumin: 3.5 g/dL (ref 3.5–5.0)
Alkaline Phosphatase: 58 U/L (ref 38–126)
Anion gap: 9 (ref 5–15)
BUN: 5 mg/dL — ABNORMAL LOW (ref 6–20)
CO2: 22 mmol/L (ref 22–32)
Calcium: 8.7 mg/dL — ABNORMAL LOW (ref 8.9–10.3)
Chloride: 108 mmol/L (ref 98–111)
Creatinine, Ser: 0.51 mg/dL (ref 0.44–1.00)
GFR calc Af Amer: >60 mL/min (ref >60)
GFR calc non Af Amer: >60 mL/min (ref >60)
Glucose, Bld: 91 mg/dL (ref 70–99)
Potassium: 3.6 mmol/L (ref 3.5–5.1)
Sodium: 139 mmol/L (ref 135–145)
Total Bilirubin: 0.8 mg/dL (ref 0.3–1.2)
Total Protein: 6.7 g/dL (ref 6.5–8.1)

## 2018-11-01 LAB — PROTIME-INR
INR: 1.2 (ref 0.8–1.2)
Prothrombin Time: 14.8 seconds (ref 11.4–15.2)

## 2018-11-01 MED ORDER — PANTOPRAZOLE SODIUM 40 MG PO TBEC: 40.0000 mg | DELAYED_RELEASE_TABLET | Freq: Every day | ORAL | 0 refills | Status: DC

## 2018-11-01 NOTE — Plan of Care (Addendum)
Nsg Discharge Note  Admit Date:  10/29/2018 Discharge date: 11/01/2018   Helane Rima to be D/C'd Home per MD order.  AVS completed.  Copy for chart, and copy for patient signed, and dated. Patient/caregiver able to verbalize understanding.  Discharge Medication: Allergies as of 11/01/2018      Reactions   Clindamycin/lincomycin Other (See Comments)   Elevated LFTs      Medication List    STOP taking these medications   clindamycin 150 MG capsule Commonly known as: CLEOCIN   HYDROcodone-acetaminophen 5-325 MG tablet Commonly known as: NORCO/VICODIN   ibuprofen 600 MG tablet Commonly known as: ADVIL   metroNIDAZOLE 500 MG tablet Commonly known as: FLAGYL   ondansetron 4 MG tablet Commonly known as: ZOFRAN   valACYclovir 1000 MG tablet Commonly known as: VALTREX     TAKE these medications   pantoprazole 40 MG tablet Commonly known as: PROTONIX Take 1 tablet (40 mg total) by mouth daily.       Discharge Assessment: Vitals:   10/31/18 2227 11/01/18 0613  BP: (!) 121/95 132/68  Pulse: 65 (!) 56  Resp: 20 16  Temp: 98.4 F (36.9 C) 98.2 F (36.8 C)  SpO2: 95% 100%   Skin clean, dry and intact without evidence of skin break down, no evidence of skin tears noted. IV catheter discontinued intact. Site without signs and symptoms of complications - no redness or edema noted at insertion site, patient denies c/o pain - only slight tenderness at site.  Dressing with slight pressure applied.  D/c Instructions-Education: Discharge instructions given to patient/family with verbalized understanding. D/c education completed with patient/family including follow up instructions, medication list, d/c activities limitations if indicated, with other d/c instructions as indicated by MD - patient able to verbalize understanding, all questions fully answered. Patient instructed to return to ED, call 911, or call MD for any changes in condition.  Patient escorted via [place], and D/C home  via private auto.  Dorcas Mcmurray, LPN 1/0/9323 55:73 AM

## 2018-11-01 NOTE — Progress Notes (Signed)
Spoke to poison control.  We went over LFTs, Chemistries and PT/INR.  They said we could stop the acetylcysteine drip and they were releasing her from their end.

## 2018-11-01 NOTE — Discharge Summary (Signed)
Physician Discharge Summary  Barnetta HammersmithBrittaney N Knickerbocker ZOX:[id number] DOB: September 29, 1985 DOA: 10/29/2018  [facility]  Admit date: 10/29/2018  Discharge date: 11/01/2018  Admitted From:Home  Disposition:  Home  Recommendations for Outpatient Follow-up:  1. Follow up with PCP in 1-2 weeks 2. List of PCPs to be given prior to discharge to arrange follow-up 3. Follow-up with dentist as soon as possible after discharge 4. Avoid acetaminophen and acetaminophen containing products 5. Repeat CMP in PCP office in 1 week to ensure improvement 6. Protonix prescribed and should be continued for 1 month due to suspected gastritis  Home Health: None  Equipment/Devices: None  Discharge Condition: Stable  CODE STATUS: Full  Diet recommendation: Regular  Brief/Interim Summary: Per HPI: Callie N Woodis a 33 y.o.femalewith medical history significant forsome mild anxiety and possible hypertension who has been struggling with some lower dental pain over the last 3 days as well as some swelling. She states that she needs her tooth pulled and has been trying to go to dentist but came to the ED several days ago and received some clindamycin as well as ibuprofen that she has been on. She started to develop some intermittent epigastric pain as well as some nausea which prompted her to come back to the ED on 7/4. She denies any alcohol use currently and states that the pain is not worse with food intake. She denies any radiation of the pain, nor does she have fevers, chills, emesis, constipation, or diarrhea. She did have some mild nausea, but not currently. She has had prior laparoscopic cholecystectomy in 2007.  Patient was admitted with transaminitis likely secondary to Tylenol overdose as well as some mild associated gastritis.  She has remained on IV Mucomyst infusion per poison control recommendations over the last 48 hours with improvement in her liver enzyme levels.  Please  see below for details regarding levels.  She is okay for discharge from poison control standpoint and has been advised to avoid acetaminophen or acetaminophen containing products.  She will arrange follow-up with dentist for her toothache as soon as possible and will also call to arrange a follow-up appointment with PCP from list given in the next 1 to 2 weeks to repeat CMP.  She has no further abdominal symptoms or nausea or vomiting.  No other acute events noted during the course of this admission.  Patient is stable for discharge.  Discharge Diagnoses:  Active Problems:   Transaminitis  Principal discharge diagnosis: Mild transaminitis secondary to Tylenol overdose.  Discharge Instructions  Discharge Instructions    Diet - low sodium heart healthy   Complete by: As directed    Increase activity slowly   Complete by: As directed      Allergies as of 11/01/2018      Reactions   Clindamycin/lincomycin Other (See Comments)   Elevated LFTs      Medication List    STOP taking these medications   clindamycin 150 MG capsule Commonly known as: CLEOCIN   HYDROcodone-acetaminophen 5-325 MG tablet Commonly known as: NORCO/VICODIN   ibuprofen 600 MG tablet Commonly known as: ADVIL   metroNIDAZOLE 500 MG tablet Commonly known as: FLAGYL   ondansetron 4 MG tablet Commonly known as: ZOFRAN   valACYclovir 1000 MG tablet Commonly known as: VALTREX     TAKE these medications   pantoprazole 40 MG tablet Commonly known as: PROTONIX Take 1 tablet (40 mg total) by mouth daily.      Follow-up Information    pcp.  Schedule an appointment as soon as possible for a visit in 1 week(s).          Allergies  Allergen Reactions  . Clindamycin/Lincomycin Other (See Comments)    Elevated LFTs    Consultations:  None   Procedures/Studies: Ct Abdomen Pelvis W Contrast  Result Date: 10/10/2018 CLINICAL DATA:  Left flank pain for several months which has worsened over the past 3  days. EXAM: CT ABDOMEN AND PELVIS WITH CONTRAST TECHNIQUE: Multidetector CT imaging of the abdomen and pelvis was performed using the standard protocol following bolus administration of intravenous contrast. CONTRAST:  100 mL OMNIPAQUE IOHEXOL 300 MG/ML  SOLN COMPARISON:  CT abdomen and pelvis 01/06/2008. FINDINGS: Lower chest: Mild dependent atelectasis. No pleural or pericardial effusion. Hepatobiliary: No focal liver abnormality is seen. Status post cholecystectomy. No biliary dilatation. Pancreas: Unremarkable. No pancreatic ductal dilatation or surrounding inflammatory changes. Spleen: Normal in size without focal abnormality. Adrenals/Urinary Tract: The adrenal glands appear normal. Kidneys and ureters also appear normal. No urinary tract stone is seen. Walls of the urinary bladder are mildly thickened and there is some stranding about the bladder. Stomach/Bowel: Stomach is within normal limits. Appendix appears normal. No evidence of bowel wall thickening, distention, or inflammatory changes. Vascular/Lymphatic: No significant vascular findings are present. No enlarged abdominal or pelvic lymph nodes. Reproductive: Uterus and bilateral adnexa are unremarkable. Other: None. Musculoskeletal: Negative. IMPRESSION: Findings consistent with cystitis. The examination is otherwise negative. Electronically Signed   By: Drusilla Kannerhomas  Dalessio M.D.   On: 10/10/2018 07:25   [place] Abdomen Limited Ruq  Result Date: 10/30/2018 CLINICAL DATA:  Right upper quadrant pain with elevated LFTs. Prior cholecystectomy. EXAM: ULTRASOUND ABDOMEN LIMITED RIGHT UPPER QUADRANT COMPARISON:  CT abdomen pelvis dated October 10, 2018. FINDINGS: Gallbladder: Surgically absent. Common bile duct: Diameter: 3 mm, normal. Liver: No focal lesion identified. Within normal limits in parenchymal echogenicity. Portal vein is patent on color Doppler imaging with normal direction of blood flow towards the liver. IMPRESSION: 1. Prior cholecystectomy. Otherwise  unremarkable right upper quadrant ultrasound. Electronically Signed   By: Obie DredgeWilliam T Derry M.D.   On: 10/30/2018 09:52      Discharge Exam: Vitals:   10/31/18 2227 11/01/18 0613  BP: (!) 121/95 132/68  Pulse: 65 (!) 56  Resp: 20 16  Temp: 98.4 F (36.9 C) 98.2 F (36.8 C)  SpO2: 95% 100%   Vitals:   10/31/18 0535 10/31/18 1420 10/31/18 2227 11/01/18 0613  BP: 115/62 117/85 (!) 121/95 132/68  Pulse: 64 (!) 57 65 (!) 56  Resp: 14 16 20 16   Temp: 98.2 F (36.8 C) 98.4 F (36.9 C) 98.4 F (36.9 C) 98.2 F (36.8 C)  TempSrc: Oral Oral Oral Oral  SpO2: 100% 100% 95% 100%  Weight:      Height:        General: Pt is alert, awake, not in acute distress Cardiovascular: RRR, S1/S2 +, no rubs, no gallops Respiratory: CTA bilaterally, no wheezing, no rhonchi Abdominal: Soft, NT, ND, bowel sounds + Extremities: no edema, no cyanosis    The results of significant diagnostics from this hospitalization (including imaging, microbiology, ancillary and laboratory) are listed below for reference.     Microbiology: Recent Results (from the past 240 hour(s))  Urine culture     Status: Abnormal   Collection Time: 10/30/18 12:22 AM   Specimen: Urine, Clean Catch  Result Value Ref Range Status   Specimen Description   Final    URINE, CLEAN CATCH Performed at [facility], [address], [place], [place] [address]    Special Requests   Final    NONE Performed at [facility], [address], [place], [place] [address]    Culture (A)  Final    <10,000 COLONIES/mL INSIGNIFICANT GROWTH Performed at [facility] [address], [place], [place] [address]    Report Status 10/31/2018 FINAL  Final  SARS Coronavirus 2 (CEPHEID - Performed in [facility] hospital lab), Hosp Order     Status: None   Collection Time: 10/30/18  9:07 AM   Specimen: Nasopharyngeal Swab  Result Value Ref Range Status   SARS Coronavirus 2 NEGATIVE NEGATIVE Final    Comment: (NOTE) If result is  NEGATIVE SARS-CoV-2 target nucleic acids are NOT DETECTED. The SARS-CoV-2 RNA is generally detectable in upper and lower  respiratory specimens during the acute phase of infection. The lowest  concentration of SARS-CoV-2 viral copies this assay can detect is 250  copies / mL. A negative result does not preclude SARS-CoV-2 infection  and should not be used as the sole basis for treatment or other  patient management decisions.  A negative result may occur with  improper specimen collection / handling, submission of specimen other  than nasopharyngeal swab, presence of viral mutation(s) within the  areas targeted by this assay, and inadequate number of viral copies  (<250 copies / mL). A negative result must be combined with clinical  observations, patient history, and epidemiological information. If result is POSITIVE SARS-CoV-2 target nucleic acids are DETECTED. The SARS-CoV-2 RNA is generally detectable in upper and lower  respiratory specimens dur ing the acute phase of infection.  Positive  results are indicative of active infection with SARS-CoV-2.  Clinical  correlation with patient history and other diagnostic information is  necessary to determine patient infection status.  Positive results do  not rule out bacterial infection or co-infection with other viruses. If result is PRESUMPTIVE POSTIVE SARS-CoV-2 nucleic acids MAY BE PRESENT.   A presumptive positive result was obtained on the submitted specimen  and confirmed on repeat testing.  While 2019 novel coronavirus  (SARS-CoV-2) nucleic acids may be present in the submitted sample  additional confirmatory testing may be necessary for epidemiological  and / or clinical management purposes  to differentiate between  SARS-CoV-2 and other Sarbecovirus currently known to infect humans.  If clinically indicated additional testing with an alternate test  methodology [phone]) is advised. The SARS-CoV-2 RNA is generally  detectable  in upper and lower respiratory sp ecimens during the acute  phase of infection. The expected result is Negative. Fact Sheet for Patients:  StrictlyIdeas.no Fact Sheet for Healthcare Providers: BankingDealers.co.za This test is not yet approved or cleared by the [place] FDA and has been authorized for detection and/or diagnosis of SARS-CoV-2 by FDA under an Emergency Use Authorization (EUA).  This EUA will remain in effect (meaning this test can be used) for the duration of the COVID-19 declaration under Section 564(b)(1) of the Act, 21 U.S.C. section 360bbb-3(b)(1), unless the authorization is terminated or revoked sooner. Performed at [facility], [address], [place], [place] [address]      Labs: BNP (last 3 results) No results for input(s): BNP in the last 8760 hours. Basic Metabolic Panel: Recent Labs  Lab 10/29/18 2255 10/31/18 0419 11/01/18 0435  NA 140 137 139  K 3.2* 3.1* 3.6  CL 105 105 108  CO2 22 23 22   GLUCOSE 94 91 91  BUN 8 5* 5*  CREATININE 0.49 0.56 0.51  CALCIUM 8.7* 8.5* 8.7*  MG  --  1.8  --    Liver Function Tests: Recent Labs  Lab 10/29/18 2255 10/31/18 0419 11/01/18 0435  AST 188* 72* 27  ALT 92* 138* 98*  ALKPHOS 62 59 58  BILITOT 1.2 0.7 0.8  PROT 7.3 6.3* 6.7  ALBUMIN 4.0 3.3* 3.5   Recent Labs  Lab 10/29/18 2255  LIPASE 27   No results for input(s): AMMONIA in the last 168 hours. CBC: Recent Labs  Lab 10/29/18 2255  WBC 10.4  NEUTROABS 9.4*  HGB 12.8  HCT 39.2  MCV 80.8  PLT 310   Cardiac Enzymes: Recent Labs  Lab 10/30/18 0100  CKTOTAL 67   BNP: Invalid input(s): POCBNP CBG: No results for input(s): GLUCAP in the last 168 hours. D-Dimer No results for input(s): DDIMER in the last 72 hours. Hgb A1c No results for input(s): HGBA1C in the last 72 hours. Lipid Profile No results for input(s): CHOL, HDL, LDLCALC, TRIG, CHOLHDL, LDLDIRECT in the last 72  hours. Thyroid function studies No results for input(s): TSH, T4TOTAL, T3FREE, THYROIDAB in the last 72 hours.  Invalid input(s): FREET3 Anemia work up No results for input(s): VITAMINB12, FOLATE, FERRITIN, TIBC, IRON, RETICCTPCT in the last 72 hours. Urinalysis    Component Value Date/Time   COLORURINE YELLOW 10/30/2018 0022   APPEARANCEUR HAZY (A) 10/30/2018 0022   LABSPEC 1.023 10/30/2018 0022   PHURINE 5.0 10/30/2018 0022   GLUCOSEU NEGATIVE 10/30/2018 0022   HGBUR SMALL (A) 10/30/2018 0022   BILIRUBINUR NEGATIVE 10/30/2018 0022   KETONESUR NEGATIVE 10/30/2018 0022   PROTEINUR 30 (A) 10/30/2018 0022   UROBILINOGEN 0.2 05/01/2013 1320   NITRITE NEGATIVE 10/30/2018 0022   LEUKOCYTESUR MODERATE (A) 10/30/2018 0022   Sepsis Labs Invalid input(s): PROCALCITONIN,  WBC,  LACTICIDVEN Microbiology Recent Results (from the past 240 hour(s))  Urine culture     Status: Abnormal   Collection Time: 10/30/18 12:22 AM   Specimen: Urine, Clean Catch  Result Value Ref Range Status   Specimen Description   Final    URINE, CLEAN CATCH Performed at [facility], [address], [place], [place] [address]    Special Requests   Final    NONE Performed at [facility], [address], [place], [place] [address]    Culture (A)  Final    <10,000 COLONIES/mL INSIGNIFICANT GROWTH Performed at [facility], [address] [place], [place] [address]    Report Status 10/31/2018 FINAL  Final  SARS Coronavirus 2 (CEPHEID - Performed in [facility] hospital lab), Hosp Order     Status: None   Collection Time: 10/30/18  9:07 AM   Specimen: Nasopharyngeal Swab  Result Value Ref Range Status   SARS Coronavirus 2 NEGATIVE NEGATIVE Final    Comment: (NOTE) If result is NEGATIVE SARS-CoV-2 target nucleic acids are NOT DETECTED. The SARS-CoV-2 RNA is generally detectable in upper and lower  respiratory specimens during the acute phase of infection. The lowest  concentration of SARS-CoV-2 viral  copies this assay can detect is 250  copies / mL. A negative result does not preclude SARS-CoV-2 infection  and should not be used as the sole basis for treatment or other  patient management decisions.  A negative result may occur with  improper specimen collection / handling, submission of specimen other  than nasopharyngeal swab, presence of viral mutation(s) within the  areas targeted by this assay, and inadequate number of viral copies  (<  250 copies / mL). A negative result must be combined with clinical  observations, patient history, and epidemiological information. If result is POSITIVE SARS-CoV-2 target nucleic acids are DETECTED. The SARS-CoV-2 RNA is generally detectable in upper and lower  respiratory specimens dur ing the acute phase of infection.  Positive  results are indicative of active infection with SARS-CoV-2.  Clinical  correlation with patient history and other diagnostic information is  necessary to determine patient infection status.  Positive results do  not rule out bacterial infection or co-infection with other viruses. If result is PRESUMPTIVE POSTIVE SARS-CoV-2 nucleic acids MAY BE PRESENT.   A presumptive positive result was obtained on the submitted specimen  and confirmed on repeat testing.  While 2019 novel coronavirus  (SARS-CoV-2) nucleic acids may be present in the submitted sample  additional confirmatory testing may be necessary for epidemiological  and / or clinical management purposes  to differentiate between  SARS-CoV-2 and other Sarbecovirus currently known to infect humans.  If clinically indicated additional testing with an alternate test  methodology [phone]([id number]) is advised. The SARS-CoV-2 RNA is generally  detectable in upper and lower respiratory sp ecimens during the acute  phase of infection. The expected result is Negative. Fact Sheet for Patients:  BoilerBrush.com.cyhttps://www.fda.gov/media/136312/download Fact Sheet for Healthcare  Providers: https://pope.com/https://www.fda.gov/media/136313/download This test is not yet approved or cleared by the [place] FDA and has been authorized for detection and/or diagnosis of SARS-CoV-2 by FDA under an Emergency Use Authorization (EUA).  This EUA will remain in effect (meaning this test can be used) for the duration of the COVID-19 declaration under Section 564(b)(1) of the Act, 21 U.S.C. section 360bbb-3(b)(1), unless the authorization is terminated or revoked sooner. Performed at [facility], [address] [place], [place] [address]      Time coordinating discharge: 35 minutes  SIGNED:   Erick BlinksPratik D , DO [facility] 11/01/2018, 8:36 AM  If 7PM-7AM, please contact night-coverage [email or web address] Password [email or web address]

## 2018-11-18 ENCOUNTER — Telehealth: Payer: Medicaid Other | Admitting: Family

## 2018-11-18 ENCOUNTER — Other Ambulatory Visit: Payer: Self-pay

## 2018-11-18 ENCOUNTER — Other Ambulatory Visit: Payer: Medicaid Other

## 2018-11-18 DIAGNOSIS — Z20822 Contact with and (suspected) exposure to covid-19: Secondary | ICD-10-CM

## 2018-11-18 NOTE — Progress Notes (Signed)
E-Visit for Corona Virus Screening   Your current symptoms could be consistent with the coronavirus.  Many health care providers can now test patients at their office but not all are.  [facility] has multiple testing sites. For information on our COVID testing locations and hours go to [email or web address]  Please quarantine yourself while awaiting your test results.  We are enrolling you in our MyChart Home [place] for COVID19 . Daily you will receive a questionnaire within the MyChart website. Our COVID 19 response team willl be monitoriing your responses daily.    COVID-19 is a respiratory illness with symptoms that are similar to the flu. Symptoms are typically mild to moderate, but there have been cases of severe illness and death due to the virus. The following symptoms may appear 2-14 days after exposure: . Fever . Cough . Shortness of breath or difficulty breathing . Chills . Repeated shaking with chills . Muscle pain . Headache . Sore throat . New loss of taste or smell . Fatigue . Congestion or runny nose . Nausea or vomiting . Diarrhea  It is vitally important that if you feel that you have an infection such as this virus or any other virus that you stay home and away from places where you may spread it to others.  You should self-quarantine for 14 days if you have symptoms that could potentially be coronavirus or have been in close contact a with a person diagnosed with COVID-19 within the last 2 weeks. You should avoid contact with people age 65 and older.   You should wear a mask or cloth face covering over your nose and mouth if you must be around other people or animals, including pets (even at home). Try to stay at least 6 feet away from other people. This will protect the people around you.  You may also take acetaminophen (Tylenol) as needed for fever.   Reduce your risk of any infection by using the same precautions used for avoiding the  common cold or flu:  . Wash your hands often with soap and warm water for at least 20 seconds.  If soap and water are not readily available, use an alcohol-based hand sanitizer with at least 60% alcohol.  . If coughing or sneezing, cover your mouth and nose by coughing or sneezing into the elbow areas of your shirt or coat, into a tissue or into your sleeve (not your hands). . Avoid shaking hands with others and consider head nods or verbal greetings only. . Avoid touching your eyes, nose, or mouth with unwashed hands.  . Avoid close contact with people who are sick. . Avoid places or events with large numbers of people in one location, like concerts or sporting events. . Carefully consider travel plans you have or are making. . If you are planning any travel outside or inside the [place], visit the CDC's Travelers' Health webpage for the latest health notices. . If you have some symptoms but not all symptoms, continue to monitor at home and seek medical attention if your symptoms worsen. . If you are having a medical emergency, call 911.  HOME CARE . Only take medications as instructed by your medical team. . Drink plenty of fluids and get plenty of rest. . A steam or ultrasonic humidifier can help if you have congestion.   GET HELP RIGHT AWAY IF YOU HAVE EMERGENCY WARNING SIGNS** FOR COVID-19. If you or someone is showing any of these signs seek emergency medical care immediately. Call   911 or proceed to your closest emergency facility if: . You develop worsening high fever. . Trouble breathing . Bluish lips or face . Persistent pain or pressure in the chest . New confusion . Inability to wake or stay awake . You cough up blood. . Your symptoms become more severe  **This list is not all possible symptoms. Contact your medical provider for any symptoms that are sever or concerning to you.   MAKE SURE YOU   Understand these instructions.  Will watch your condition.  Will get help right  away if you are not doing well or get worse.  Your e-visit answers were reviewed by a board certified advanced clinical practitioner to complete your personal care plan.  Depending on the condition, your plan could have included both over the counter or prescription medications.  If there is a problem please reply once you have received a response from your provider.  Your safety is important to us.  If you have drug allergies check your prescription carefully.    You can use MyChart to ask questions about today's visit, request a non-urgent call back, or ask for a work or school excuse for 24 hours related to this e-Visit. If it has been greater than 24 hours you will need to follow up with your provider, or enter a new e-Visit to address those concerns. You will get an e-mail in the next two days asking about your experience.  I hope that your e-visit has been valuable and will speed your recovery. Thank you for using e-visits.   Greater than 5 minutes, yet less than 10 minutes of time have been spent researching, coordinating, and implementing care for this patient today.  Thank you for the details you included in the comment boxes. Those details are very helpful in determining the best course of treatment for you and help us to provide the best care.  

## 2018-11-21 LAB — NOVEL CORONAVIRUS, NAA: SARS-CoV-2, NAA: NOT DETECTED

## 2018-12-04 ENCOUNTER — Other Ambulatory Visit: Payer: Self-pay

## 2018-12-04 ENCOUNTER — Emergency Department (HOSPITAL_COMMUNITY): Payer: Self-pay

## 2018-12-04 ENCOUNTER — Encounter (HOSPITAL_COMMUNITY): Payer: Self-pay | Admitting: Emergency Medicine

## 2018-12-04 ENCOUNTER — Emergency Department (HOSPITAL_COMMUNITY)
Admission: EM | Admit: 2018-12-04 | Discharge: 2018-12-04 | Disposition: A | Discharge status: Home / Self Care | Payer: Self-pay | Attending: Emergency Medicine | Admitting: Emergency Medicine

## 2018-12-04 DIAGNOSIS — Y999 Unspecified external cause status: Secondary | ICD-10-CM | POA: Insufficient documentation

## 2018-12-04 DIAGNOSIS — S161XXA Strain of muscle, fascia and tendon at neck level, initial encounter: Secondary | ICD-10-CM | POA: Insufficient documentation

## 2018-12-04 DIAGNOSIS — I10 Essential (primary) hypertension: Secondary | ICD-10-CM | POA: Insufficient documentation

## 2018-12-04 DIAGNOSIS — X58XXXA Exposure to other specified factors, initial encounter: Secondary | ICD-10-CM | POA: Insufficient documentation

## 2018-12-04 DIAGNOSIS — F17210 Nicotine dependence, cigarettes, uncomplicated: Secondary | ICD-10-CM | POA: Insufficient documentation

## 2018-12-04 DIAGNOSIS — Y929 Unspecified place or not applicable: Secondary | ICD-10-CM | POA: Insufficient documentation

## 2018-12-04 DIAGNOSIS — Z79899 Other long term (current) drug therapy: Secondary | ICD-10-CM | POA: Insufficient documentation

## 2018-12-04 DIAGNOSIS — Y939 Activity, unspecified: Secondary | ICD-10-CM | POA: Insufficient documentation

## 2018-12-04 LAB — POC URINE PREG, ED: Preg Test, Ur: NEGATIVE

## 2018-12-04 MED ORDER — NAPROXEN 500 MG PO TABS: 500.0000 mg | ORAL_TABLET | Freq: Two times a day (BID) | ORAL | 0 refills | Status: DC

## 2018-12-04 MED ORDER — KETOROLAC TROMETHAMINE 60 MG/2ML IM SOLN
60.0000 mg | Freq: Once | INTRAMUSCULAR | Status: AC
  Administered 2018-12-04: 10:00:00 60 mg via INTRAMUSCULAR
  Filled 2018-12-04: qty 2

## 2018-12-04 MED ORDER — METOCLOPRAMIDE HCL 5 MG/ML IJ SOLN
10.0000 mg | Freq: Once | INTRAMUSCULAR | Status: AC
  Administered 2018-12-04: 10:00:00 10 mg via INTRAMUSCULAR
  Filled 2018-12-04: qty 2

## 2018-12-04 MED ORDER — METHOCARBAMOL 500 MG PO TABS: 500.0000 mg | ORAL_TABLET | Freq: Three times a day (TID) | ORAL | 0 refills | Status: DC

## 2018-12-04 NOTE — ED Triage Notes (Signed)
Pt reports neck pain without injury x 7 days.

## 2018-12-04 NOTE — Discharge Instructions (Addendum)
Alternate ice and heat to your neck.  Try to avoid wearing your braids for at least 1 week.  Follow-up with your primary doctor for recheck or return to the ER for any worsening symptoms.

## 2018-12-05 NOTE — ED Provider Notes (Signed)
[facility] EMERGENCY DEPARTMENT Provider Note   CSN: [id number] Arrival date & time: 12/04/18  [id number]     History   Chief Complaint Chief Complaint  Patient presents with  . Neck Pain    HPI Ruth Mullen is a 33 y.o. female.     HPI   Ruth Mullen is a 33 y.o. female who presents to the Emergency Department complaining of posterior neck pain and diffuse headache for 1 week.  She describes an aching pain that is associated with movement to her neck that radiates to the top of her shoulders.  Neck pain is also been associated with intermittent diffuse headache that this she describes as throbbing sensation around the top of her head.  Pain began several days after having large braids put in her hair.  She states that she wakes up with neck pain and that it gradually improves throughout the day but returns.  She denies known injury, pain numbness or weakness to her upper extremities, visual changes, fever, chills, neck stiffness and vomiting.  She has taken over-the-counter pain relievers with intermittent relief.   Past Medical History:  Diagnosis Date  . Anxiety as acute reaction to exceptional stress   . BV (bacterial vaginosis) 09/07/2013  . Chlamydia   . Depression 12/06/2014  . Dysmenorrhea 11/19/2015  . Fatigue 06/28/2014  . 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 Hypertension   . Menorrhagia with regular cycle 11/19/2015  . Vaginal discharge 09/07/2013    Patient Active Problem List   Diagnosis Date Noted  . Transaminitis 10/30/2018  . Encounter for initial prescription of contraceptive pills 02/01/2017  . Menorrhagia with regular cycle 11/19/2015  . Dysmenorrhea 11/19/2015  . Depression 12/06/2014  . Fatigue 06/28/2014  . Abnormal uterine bleeding (AUB) 01/15/2014  . Vaginal discharge 09/07/2013  . BV (bacterial vaginosis) 09/07/2013  . H/O cervical incompetence 07/18/2013  . Mild hypertension 07/18/2013  . 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 Marijuana  use 01/02/2013    Past Surgical History:  Procedure Laterality Date  . CERVICAL CERCLAGE N/A 02/08/2013   Procedure: CERCLAGE CERVICAL MCDONALD;  Surgeon: Tilda BurrowJohn V Ferguson, MD;  Location: [facility];  Service: Gynecology;  Laterality: N/A;  . CHOLECYSTECTOMY    . DILATION AND CURETTAGE OF UTERUS       OB History    Gravida  5   Para  2   Term  2   Preterm      AB  2   Living  2     SAB  1   TAB  1   Ectopic      Multiple      Live Births  2            Home Medications    Prior to Admission medications   Medication Sig Start Date End Date Taking? Authorizing Provider  amoxicillin (AMOXIL) 875 MG tablet Take 1 tablet by mouth 2 (two) times daily. 11/07/18  Yes [provider]  methocarbamol (ROBAXIN) 500 MG tablet Take 1 tablet (500 mg total) by mouth 3 (three) times daily. 12/04/18   Autym Siess, PA-C  naproxen (NAPROSYN) 500 MG tablet Take 1 tablet (500 mg total) by mouth 2 (two) times daily with a meal. 12/04/18   Marven Veley, PA-C  pantoprazole (PROTONIX) 40 MG tablet Take 1 tablet (40 mg total) by mouth daily. Patient not taking: Reported on 12/04/2018 11/01/18 12/01/18  Maurilio LovelyShah, Pratik D, DO    Family History Family  History  Problem Relation Age of Onset  . Hypertension Mother     Social History Social History   Tobacco Use  . Smoking status: Current Every Day Smoker    Packs/day: 0.50    Years: 1.00    Pack years: 0.50    Types: Cigarettes    Last attempt to quit: 09/21/2014    Years since quitting: 4.2  . Smokeless tobacco: Never Used  Substance Use Topics  . Alcohol use: No  . Drug use: No     Allergies   Clindamycin/lincomycin   Review of Systems Review of Systems  Constitutional: Negative for chills and fever.  Eyes: Negative for visual disturbance.  Respiratory: Negative for shortness of breath.   Cardiovascular: Negative for chest pain.  Gastrointestinal: Negative for nausea and vomiting.  Musculoskeletal: Positive for  neck pain. Negative for arthralgias, joint swelling and neck stiffness.  Skin: Negative for color change and wound.  Neurological: Positive for headaches. Negative for dizziness, syncope, speech difficulty, weakness and numbness.     Physical Exam Updated Vital Signs BP (!) 129/92 (BP Location: Right Arm)   Pulse 86   Temp 98 F (36.7 C) (Oral)   Resp 18   Ht 5' (1.524 m)   Wt 55.8 kg   LMP 10/30/2018   SpO2 98%   BMI 24.02 kg/m   Physical Exam Vitals signs and nursing note reviewed.  Constitutional:      Appearance: Normal appearance. She is not ill-appearing or toxic-appearing.     Comments: Patient is wearing a large, heavy synthetic hair braids.  HENT:     Head: Atraumatic.     Right Ear: Tympanic membrane and ear canal normal.     Left Ear: Tympanic membrane and ear canal normal.  Eyes:     Extraocular Movements: Extraocular movements intact.     Conjunctiva/sclera: Conjunctivae normal.     Pupils: Pupils are equal, round, and reactive to light.  Neck:     Musculoskeletal: Normal range of motion. Muscular tenderness present. No neck rigidity.     Meningeal: Kernig's sign absent.     Comments: Tenderness to palpation along the lower cervical spine and bilateral cervical paraspinal muscles. Cardiovascular:     Rate and Rhythm: Normal rate and regular rhythm.     Pulses: Normal pulses.  Pulmonary:     Effort: Pulmonary effort is normal.  Chest:     Chest wall: No tenderness.  Lymphadenopathy:     Cervical: No cervical adenopathy.  Skin:    General: Skin is warm.     Findings: No erythema or rash.  Neurological:     General: No focal deficit present.     Mental Status: She is alert.     GCS: GCS eye subscore is 4. GCS verbal subscore is 5. GCS motor subscore is 6.     Sensory: Sensation is intact. No sensory deficit.     Motor: Motor function is intact. No weakness.     Coordination: Coordination is intact.     Comments: CN II-XII intact.  Speech clear.  No  pronator drift.  Grip strength strong and symmetrical bilaterally.  Normal finger-nose testing.      ED Treatments / Results  Labs (all labs ordered are listed, but only abnormal results are displayed) Labs Reviewed  POC URINE PREG, ED    EKG None  Radiology Dg Cervical Spine Complete  Result Date: 12/04/2018 CLINICAL DATA:  33 year old female with history of posterior neck pain. EXAM: CERVICAL SPINE -  COMPLETE 4+ VIEW COMPARISON:  No priors. FINDINGS: There is no evidence of cervical spine fracture or prevertebral soft tissue swelling. Alignment is normal. No other significant bone abnormalities are identified. IMPRESSION: Negative cervical spine radiographs. Electronically Signed   By: Vinnie Langton M.D.   On: 12/04/2018 11:36    Procedures Procedures (including critical care time)  Medications Ordered in ED Medications  ketorolac (TORADOL) injection 60 mg (60 mg Intramuscular Given 12/04/18 0959)  metoCLOPramide (REGLAN) injection 10 mg (10 mg Intramuscular Given 12/04/18 0957)     Initial Impression / Assessment and Plan / ED Course  I have reviewed the triage vital signs and the nursing notes.  Pertinent labs & imaging results that were available during my care of the patient were reviewed by me and considered in my medical decision making (see chart for details).        Patient with reported intermittent headache and neck pain for 1 week.  Symptoms began shortly after having a large, heavy braids put in her hair.  She has full range of motion of her C-spine.  No nuchal rigidity, she is well-appearing.  Vital signs reviewed.  No focal neuro deficits.  C-spine films are reassuring.  I feel her symptoms are likely musculoskeletal.  I have advised her to remove the braids and continue ice, heat, and NSAID therapy.  She verbalized understanding agrees to plan.  Strict return precautions were discussed.  Final Clinical Impressions(s) / ED Diagnoses   Final diagnoses:  Acute  strain of neck muscle, initial encounter    ED Discharge Orders         Ordered    methocarbamol (ROBAXIN) 500 MG tablet  3 times daily     12/04/18 1158    naproxen (NAPROSYN) 500 MG tablet  2 times daily with meals     12/04/18 1158           Emad Brechtel, [place], PA-C 12/05/18 [place], [place], DO 12/08/18 1537

## 2019-07-20 IMAGING — DX DG CHEST 2V
2 series · 2 of 2 positions shown · non-contrast
Comparison: 09/14/2013

CLINICAL DATA: Chest tightness.  Heart fluttering.

EXAM:
CHEST - 2 VIEW

[chest pa]
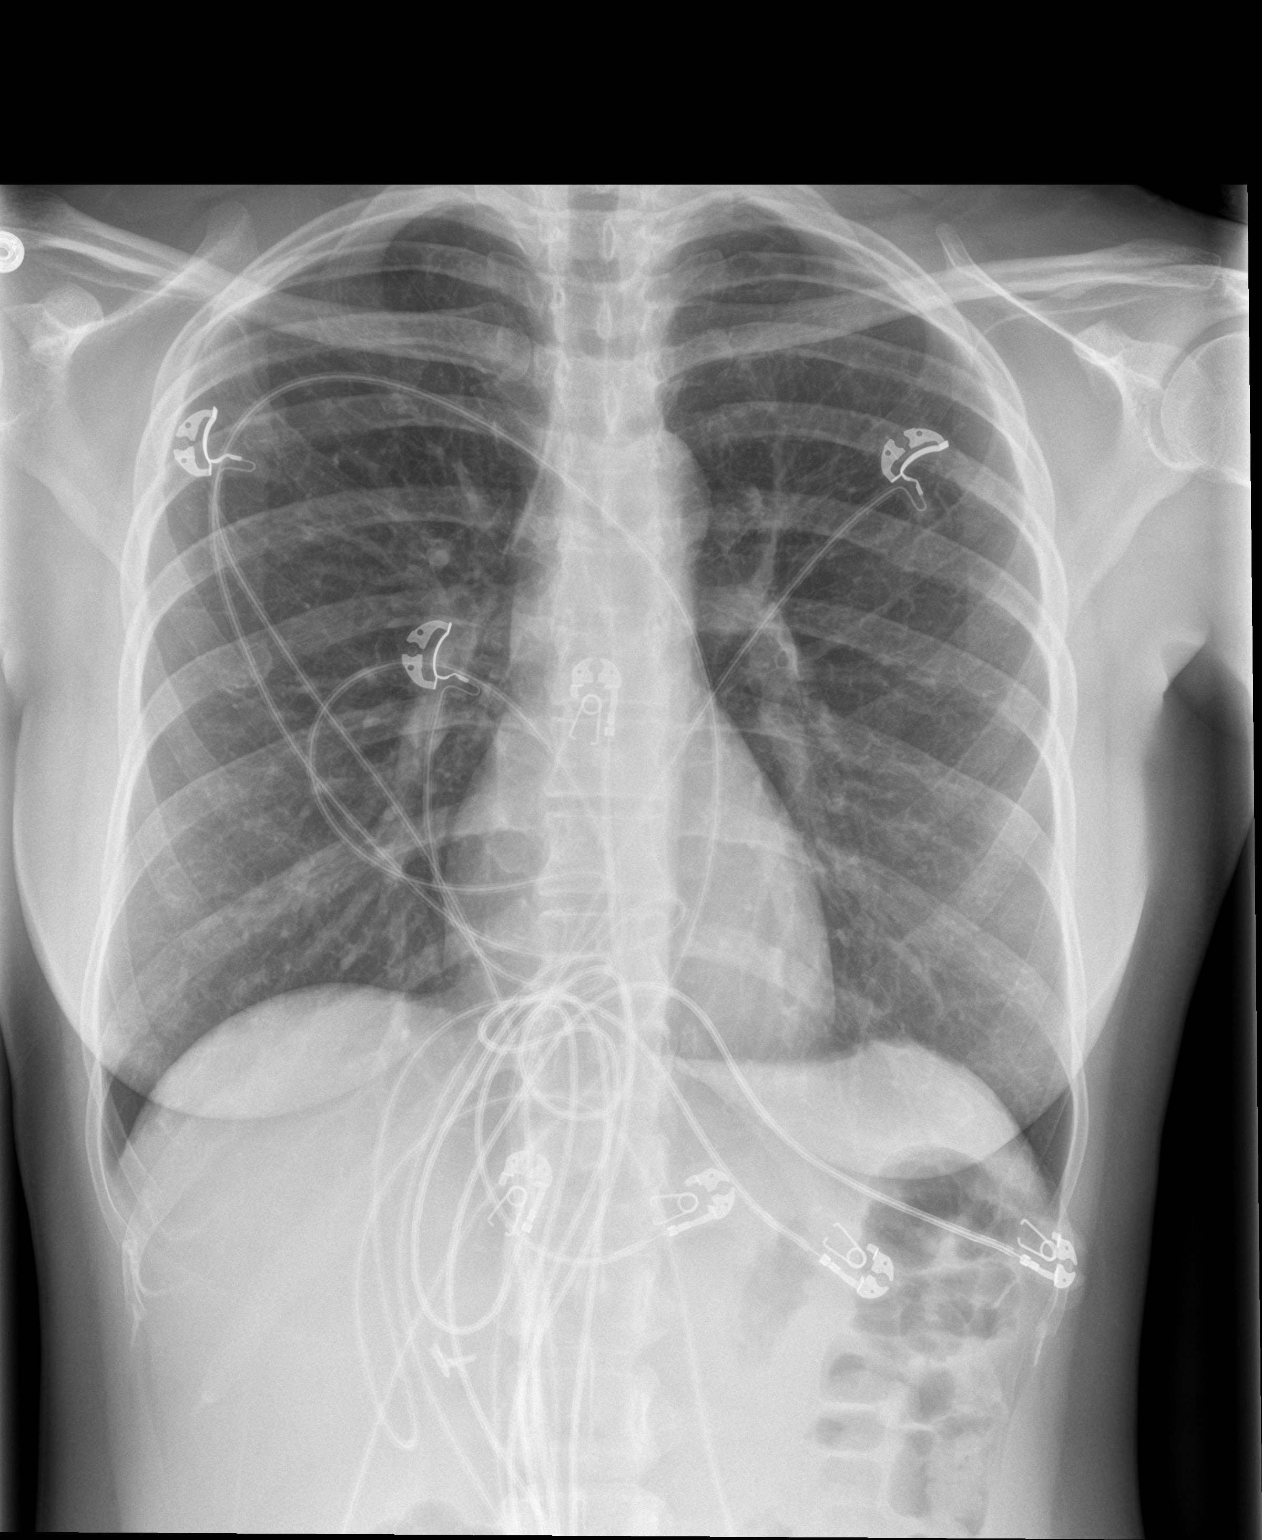

[chest lat]
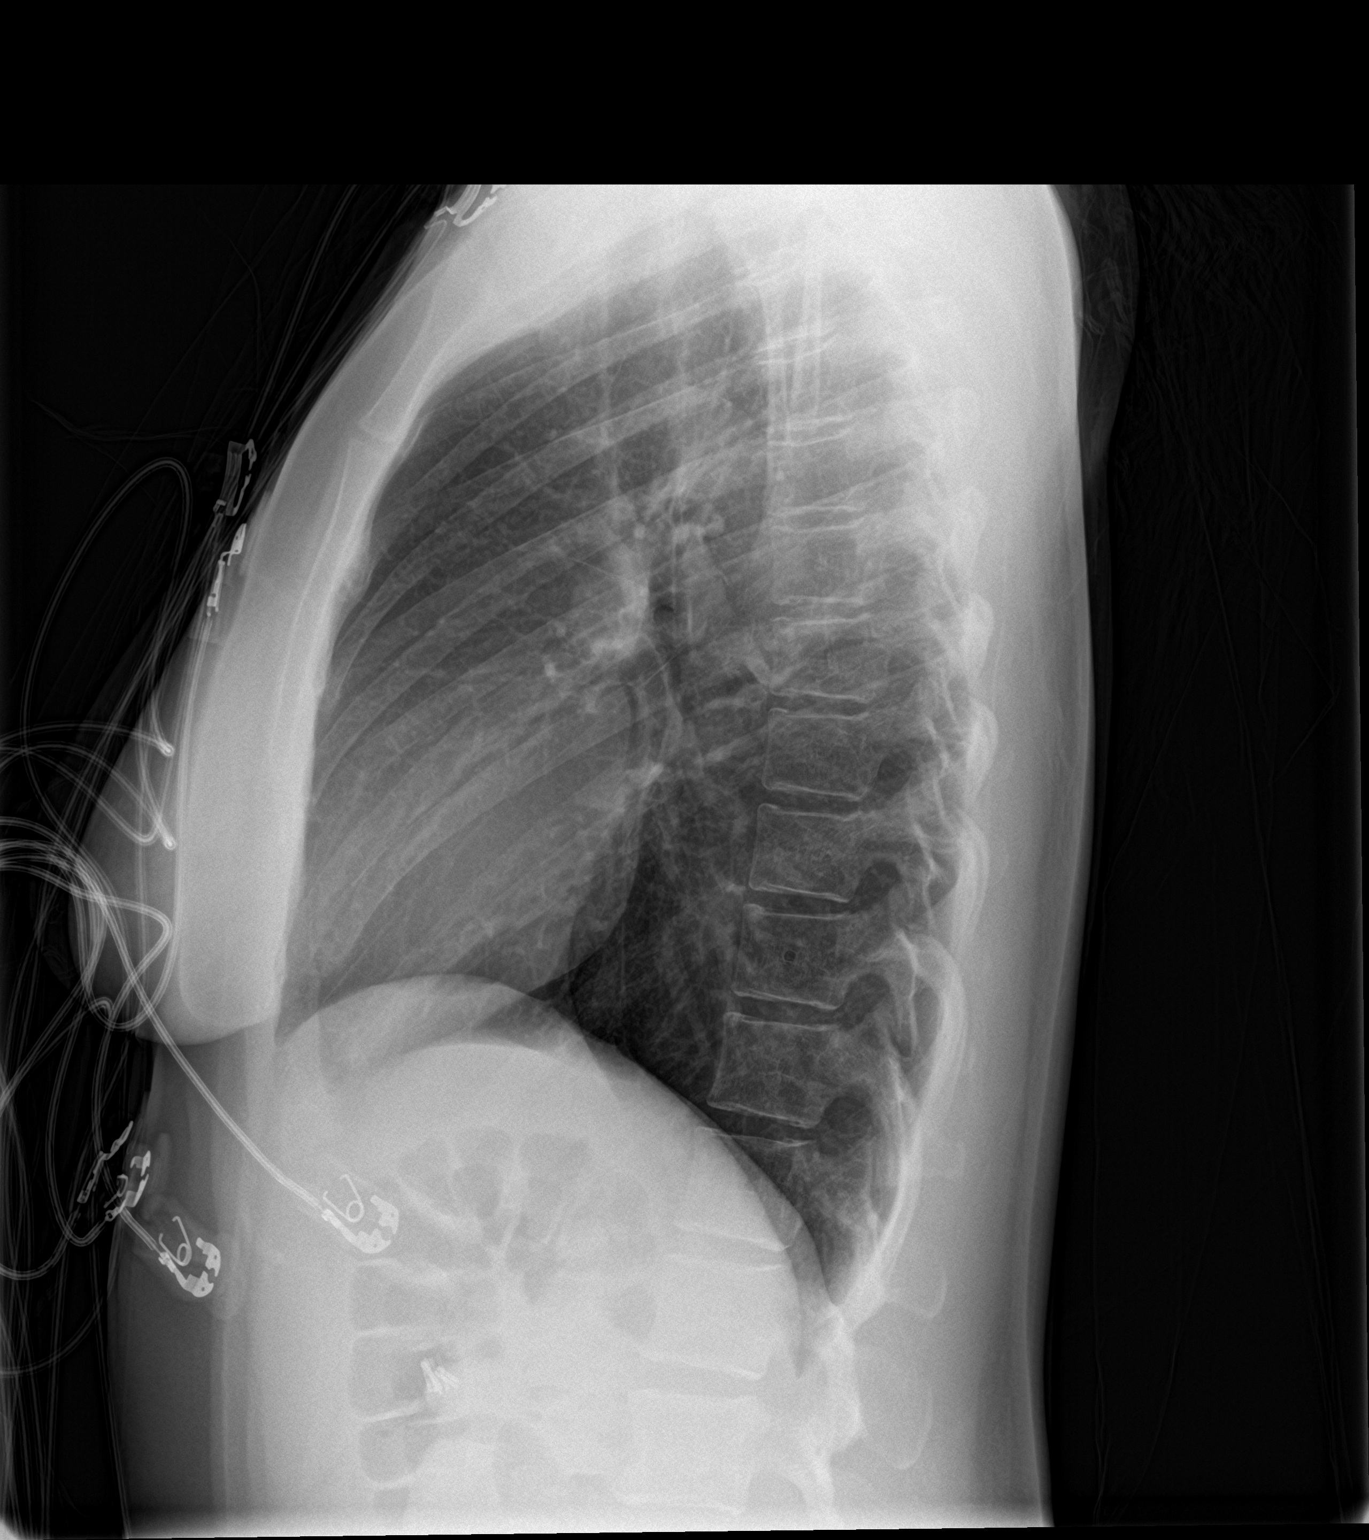

[2 of 2 positions shown; findings below may reference images not displayed]

FINDINGS: Normal heart size. No pleural effusion or edema identified. No
airspace opacities. The visualized osseous structures are
unremarkable.
IMPRESSION: 1. No acute cardiopulmonary abnormalities.

## 2019-08-22 ENCOUNTER — Other Ambulatory Visit: Payer: Self-pay

## 2019-08-22 ENCOUNTER — Ambulatory Visit: Payer: Medicaid Other | Attending: Internal Medicine

## 2019-08-22 DIAGNOSIS — Z20822 Contact with and (suspected) exposure to covid-19: Secondary | ICD-10-CM

## 2019-08-23 ENCOUNTER — Telehealth: Payer: Self-pay

## 2019-08-23 ENCOUNTER — Telehealth: Payer: Medicaid Other | Admitting: Family

## 2019-08-23 DIAGNOSIS — Z20822 Contact with and (suspected) exposure to covid-19: Secondary | ICD-10-CM

## 2019-08-23 LAB — SARS-COV-2, NAA 2 DAY TAT

## 2019-08-23 LAB — NOVEL CORONAVIRUS, NAA: SARS-CoV-2, NAA: NOT DETECTED

## 2019-08-23 MED ORDER — BENZONATATE 100 MG PO CAPS: 100.0000 mg | ORAL_CAPSULE | Freq: Three times a day (TID) | ORAL | 0 refills | Status: DC | PRN

## 2019-08-23 MED ORDER — ALBUTEROL SULFATE (2.5 MG/3ML) 0.083% IN NEBU: 2.5000 mg | INHALATION_SOLUTION | Freq: Four times a day (QID) | RESPIRATORY_TRACT | 1 refills | Status: DC | PRN

## 2019-08-23 NOTE — Progress Notes (Signed)
E-Visit for Corona Virus Screening  Your current symptoms could be consistent with the coronavirus.  Many health care providers can now test patients at their office but not all are.  [place] has multiple testing sites. For information on our [place] testing locations and hours go to [email or web address]  We are enrolling you in our [place] for [place] . Daily you will receive a questionnaire within the [place] website. Our COVID 19 response team will be monitoring your responses daily.  Testing Information: Your current Covid test is not back yet it is still pending. Someone will call you when it has resulted.   Additional testing sites in the Community:  . For [facility] Testing sites in [facility]  FaceUpdate.uy  . For Pop-up testing sites in [place]  BowlDirectory.co.uk  . For Testing sites with regular hours [email or web address]  . For [place] [email or web address]  . For [facility] BasicJet.[email or web address]  . For [facility] testing in [place] [employer] [email or web address]  . For [facility] testing in [facility]   [email or web address].[email or web address]  For  more information about community testing call [phone]   Please quarantine yourself while awaiting your test results. Please stay home for a minimum of 10 days from the first day of illness with improving symptoms and you have had 24 hours of no fever (without the use of Tylenol (Acetaminophen) Motrin (Ibuprofen) or any fever reducing medication).  Also - Do not get tested prior to returning to work because once you have  had a positive test the test can stay positive for more then a month in some cases.   You should wear a mask or cloth face covering over your nose and mouth if you must be around other people or animals, including pets (even at home). Try to stay at least 6 feet away from other people. This will protect the people around you.  Please continue good preventive care measures, including:  frequent hand-washing, avoid touching your face, cover coughs/sneezes, stay out of crowds and keep a 6 foot distance from others.  COVID-19 is a respiratory illness with symptoms that are similar to the flu. Symptoms are typically mild to moderate, but there have been cases of severe illness and death due to the virus.   The following symptoms may appear 2-14 days after exposure: . Fever . Cough . Shortness of breath or difficulty breathing . Chills . Repeated shaking with chills . Muscle pain . Headache . Sore throat . New loss of taste or smell . Fatigue . Congestion or runny nose . Nausea or vomiting . Diarrhea  Go to the nearest hospital ED for assessment if fever/cough/breathlessness are severe or illness seems like a threat to life.  It is vitally important that if you feel that you have an infection such as this virus or any other virus that you stay home and away from places where you may spread it to others.  You should avoid contact with people age 14 and older.   You can use medication such as A prescription cough medication called Tessalon Perles 100 mg. You may take 1-2 capsules every 8 hours as needed for cough and A prescription inhaler called Albuterol MDI 90 mcg /actuation 2 puffs every 4 hours as needed for shortness of breath, wheezing, cough.  You may also take acetaminophen (Tylenol) as needed for fever.  Reduce your risk of any infection by using the same precautions used for avoiding the common cold or flu:  .  Wash your hands often with soap and warm water for at least 20 seconds.  If  soap and water are not readily available, use an alcohol-based hand sanitizer with at least 60% alcohol.  . If coughing or sneezing, cover your mouth and nose by coughing or sneezing into the elbow areas of your shirt or coat, into a tissue or into your sleeve (not your hands). . Avoid shaking hands with others and consider head nods or verbal greetings only. . Avoid touching your eyes, nose, or mouth with unwashed hands.  . Avoid close contact with people who are sick. . Avoid places or events with large numbers of people in one location, like concerts or sporting events. . Carefully consider travel plans you have or are making. . If you are planning any travel outside or inside the [place], visit the CDC's Travelers' Health webpage for the latest health notices. . If you have some symptoms but not all symptoms, continue to monitor at home and seek medical attention if your symptoms worsen. . If you are having a medical emergency, call 911.  HOME CARE . Only take medications as instructed by your medical team. . Drink plenty of fluids and get plenty of rest. . A steam or ultrasonic humidifier can help if you have congestion.   GET HELP RIGHT AWAY IF YOU HAVE EMERGENCY WARNING SIGNS** FOR COVID-19. If you or someone is showing any of these signs seek emergency medical care immediately. Call 911 or proceed to your closest emergency facility if: . You develop worsening high fever. . Trouble breathing . Bluish lips or face . Persistent pain or pressure in the chest . New confusion . Inability to wake or stay awake . You cough up blood. . Your symptoms become more severe  **This list is not all possible symptoms. Contact your medical provider for any symptoms that are sever or concerning to you.  MAKE SURE YOU   Understand these instructions.  Will watch your condition.  Will get help right away if you are not doing well or get worse.  Your e-visit answers were reviewed by a board  certified advanced clinical practitioner to complete your personal care plan.  Depending on the condition, your plan could have included both over the counter or prescription medications.  If there is a problem please reply once you have received a response from your provider.  Your safety is important to [place].  If you have drug allergies check your prescription carefully.    You can use MyChart to ask questions about today's visit, request a non-urgent call back, or ask for a work or school excuse for 24 hours related to this e-Visit. If it has been greater than 24 hours you will need to follow up with your provider, or enter a new e-Visit to address those concerns. You will get an e-mail in the next two days asking about your experience.  I hope that your e-visit has been valuable and will speed your recovery. Thank you for using e-visits.  Approximately 5 minutes was spent documenting and reviewing patient's chart.

## 2019-08-23 NOTE — Telephone Encounter (Signed)
Pt. Checking on COVID 19 results . Not available yet. 

## 2020-06-20 ENCOUNTER — Other Ambulatory Visit: Payer: Self-pay

## 2020-06-20 ENCOUNTER — Ambulatory Visit
Admission: RE | Admit: 2020-06-20 | Discharge: 2020-06-20 | Disposition: A | Discharge status: Home / Self Care | Payer: Medicaid Other | Source: Ambulatory Visit

## 2020-06-20 VITALS — BP 148/86 | HR 86 | Temp 97.9°F | Resp 18

## 2020-06-20 DIAGNOSIS — R531 Weakness: Secondary | ICD-10-CM

## 2020-06-20 LAB — POCT FASTING CBG KUC MANUAL ENTRY: POCT Glucose (KUC): 94 mg/dL (ref 70–99)

## 2020-06-20 NOTE — Discharge Instructions (Signed)
Your labs are pending.

## 2020-06-20 NOTE — ED Provider Notes (Signed)
[facility]    CSN: [id number] Arrival date & time: 06/20/20  1044      History   Chief Complaint No chief complaint on file.   HPI Ruth Mullen is a 35 y.o. female.   The history is provided by the patient. No language interpreter was used.  Weakness Severity:  Severe Onset quality:  Gradual Duration:  3 weeks Timing:  Constant Progression:  Worsening Chronicity:  New Relieved by:  Nothing Worsened by:  Nothing Ineffective treatments:  None tried Associated symptoms: no abdominal pain, no chest pain, no dysuria, no fever and no stroke symptoms   Risk factors: no anemia and no excessive menstruation     Past Medical History:  Diagnosis Date  . Anxiety as acute reaction to exceptional stress   . BV (bacterial vaginosis) 09/07/2013  . Chlamydia   . Depression 12/06/2014  . Dysmenorrhea 11/19/2015  . Fatigue 06/28/2014  . 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 Hypertension   . Menorrhagia with regular cycle 11/19/2015  . Vaginal discharge 09/07/2013    Patient Active Problem List   Diagnosis Date Noted  . Transaminitis 10/30/2018  . Encounter for initial prescription of contraceptive pills 02/01/2017  . Menorrhagia with regular cycle 11/19/2015  . Dysmenorrhea 11/19/2015  . Depression 12/06/2014  . Fatigue 06/28/2014  . Abnormal uterine bleeding (AUB) 01/15/2014  . Vaginal discharge 09/07/2013  . BV (bacterial vaginosis) 09/07/2013  . H/O cervical incompetence 07/18/2013  . Mild hypertension 07/18/2013  . 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 Marijuana use 01/02/2013    Past Surgical History:  Procedure Laterality Date  . CERVICAL CERCLAGE N/A 02/08/2013   Procedure: CERCLAGE CERVICAL MCDONALD;  Surgeon: Tilda Burrow, MD;  Location: [facility];  Service: Gynecology;  Laterality: N/A;  . CHOLECYSTECTOMY    . DILATION AND CURETTAGE OF UTERUS      OB History    Gravida  5   Para  2   Term  2   Preterm      AB  2   Living  2      SAB  1   IAB  1   Ectopic      Multiple      Live Births  2            Home Medications    Prior to Admission medications   Medication Sig Start Date End Date Taking? Authorizing Provider  acyclovir (ZOVIRAX) 400 MG tablet Take 500 mg by mouth continuous as needed.   Yes [provider]  albuterol (PROVENTIL) (2.5 MG/3ML) 0.083% nebulizer solution Take 3 mLs (2.5 mg total) by nebulization every 6 (six) hours as needed for wheezing or shortness of breath. 08/23/19   Jannifer Rodney A, FNP  pantoprazole (PROTONIX) 40 MG tablet Take 1 tablet (40 mg total) by mouth daily. Patient not taking: No sig reported 11/01/18 06/20/20  Maurilio Lovely D, DO    Family History Family History  Problem Relation Age of Onset  . Hypertension Mother     Social History Social History   Tobacco Use  . Smoking status: Current Every Day Smoker    Packs/day: 1.00    Years: 1.00    Pack years: 1.00    Types: Cigarettes    Last attempt to quit: 09/21/2014    Years since quitting: 5.7  . Smokeless tobacco: Never Used  Vaping Use  . Vaping Use: Never used  Substance Use Topics  . Alcohol use:  No  . Drug use: Yes    Frequency: 1.0 times per week    Types: Marijuana    Comment: once a day     Allergies   Clindamycin/lincomycin   Review of Systems Review of Systems  Constitutional: Negative for fever.  Cardiovascular: Negative for chest pain.  Gastrointestinal: Negative for abdominal pain.  Genitourinary: Negative for dysuria.  Neurological: Positive for weakness.  All other systems reviewed and are negative.    Physical Exam Triage Vital Signs ED Triage Vitals  Enc Vitals Group     BP 06/20/20 1054 (!) 148/86     Pulse Rate 06/20/20 1054 86     Resp 06/20/20 1054 18     Temp 06/20/20 1054 97.9 F (36.6 C)     Temp Source 06/20/20 1054 Oral     SpO2 06/20/20 1054 98 %     Weight --      Height --      Head Circumference --      Peak Flow --      Pain Score  06/20/20 1055 0     Pain Loc --      Pain Edu? --      Excl. in GC? --    No data found.  Updated Vital Signs BP (!) 148/86 (BP Location: Right Arm)   Pulse 86   Temp 97.9 F (36.6 C) (Oral)   Resp 18   LMP 05/26/2020   SpO2 98%   Visual Acuity Right Eye Distance:   Left Eye Distance:   Bilateral Distance:    Right Eye Near:   Left Eye Near:    Bilateral Near:     Physical Exam Vitals and nursing note reviewed.  Constitutional:      Appearance: She is well-developed and well-nourished.  HENT:     Head: Normocephalic.     Mouth/Throat:     Mouth: Mucous membranes are moist.  Eyes:     Extraocular Movements: Extraocular movements intact and EOM normal.     Pupils: Pupils are equal, round, and reactive to light.  Cardiovascular:     Rate and Rhythm: Normal rate and regular rhythm.  Pulmonary:     Effort: Pulmonary effort is normal.  Abdominal:     General: There is no distension.  Musculoskeletal:        General: Normal range of motion.     Cervical back: Normal range of motion.  Neurological:     General: No focal deficit present.     Mental Status: She is alert and oriented to person, place, and time.  Psychiatric:        Mood and Affect: Mood and affect and mood normal.      UC Treatments / Results  Labs (all labs ordered are listed, but only abnormal results are displayed) Labs Reviewed  CBC WITH DIFFERENTIAL/PLATELET  COMPREHENSIVE METABOLIC PANEL  POCT FASTING CBG [facility] MANUAL ENTRY    EKG   Radiology No results found.  Procedures Procedures (including critical care time)  Medications Ordered in UC Medications - No data to display  Initial Impression / Assessment and Plan / UC Course  I have reviewed the triage vital signs and the nursing notes.  Pertinent labs & imaging results that were available during my care of the patient were reviewed by me and considered in my medical decision making (see chart for details).     MDM:  Pt has a  history of elevated lfts.  I will send cbc  and cmet for evaluation  Final Clinical Impressions(s) / UC Diagnoses   Final diagnoses:  Weakness     Discharge Instructions     Your labs are pending.    ED Prescriptions    None     PDMP not reviewed this encounter.  An After Visit Summary was printed and given to the patient.   Elson Areas, [place] 06/20/20 1332

## 2020-06-20 NOTE — ED Triage Notes (Signed)
Started to feel hot and dizzy yesterday.  States she felt this way before when she was having liver problems.

## 2020-06-21 LAB — COMPREHENSIVE METABOLIC PANEL
ALT: 8 IU/L (ref 0–32)
AST: 14 IU/L (ref 0–40)
Albumin/Globulin Ratio: 1.9 (ref 1.2–2.2)
Albumin: 4.7 g/dL (ref 3.8–4.8)
Alkaline Phosphatase: 78 IU/L (ref 44–121)
BUN/Creatinine Ratio: 11 (ref 9–23)
BUN: 6 mg/dL (ref 6–20)
Bilirubin Total: 0.7 mg/dL (ref 0.0–1.2)
CO2: 23 mmol/L (ref 20–29)
Calcium: 9.9 mg/dL (ref 8.7–10.2)
Chloride: 105 mmol/L (ref 96–106)
Creatinine, Ser: 0.55 mg/dL — ABNORMAL LOW (ref 0.57–1.00)
GFR calc Af Amer: 144 mL/min/{1.73_m2} (ref >59)
GFR calc non Af Amer: 125 mL/min/{1.73_m2} (ref >59)
Globulin, Total: 2.5 g/dL (ref 1.5–4.5)
Glucose: 87 mg/dL (ref 65–99)
Potassium: 4.1 mmol/L (ref 3.5–5.2)
Sodium: 143 mmol/L (ref 134–144)
Total Protein: 7.2 g/dL (ref 6.0–8.5)

## 2020-06-21 LAB — CBC WITH DIFFERENTIAL/PLATELET
Basophils Absolute: 0 10*3/uL (ref 0.0–0.2)
Basos: 1 %
EOS (ABSOLUTE): 0.1 10*3/uL (ref 0.0–0.4)
Eos: 1 %
Hematocrit: 38.6 % (ref 34.0–46.6)
Hemoglobin: 12.7 g/dL (ref 11.1–15.9)
Immature Grans (Abs): 0 10*3/uL (ref 0.0–0.1)
Immature Granulocytes: 0 %
Lymphocytes Absolute: 3.5 10*3/uL — ABNORMAL HIGH (ref 0.7–3.1)
Lymphs: 46 %
MCH: 26.1 pg — ABNORMAL LOW (ref 26.6–33.0)
MCHC: 32.9 g/dL (ref 31.5–35.7)
MCV: 79 fL (ref 79–97)
Monocytes Absolute: 0.3 10*3/uL (ref 0.1–0.9)
Monocytes: 5 %
Neutrophils Absolute: 3.6 10*3/uL (ref 1.4–7.0)
Neutrophils: 47 %
Platelets: 384 10*3/uL (ref 150–450)
RBC: 4.86 x10E6/uL (ref 3.77–5.28)
RDW: 14.3 % (ref 11.7–15.4)
WBC: 7.5 10*3/uL (ref 3.4–10.8)

## 2020-06-25 DIAGNOSIS — Z419 Encounter for procedure for purposes other than remedying health state, unspecified: Secondary | ICD-10-CM | POA: Diagnosis not present

## 2020-07-10 ENCOUNTER — Ambulatory Visit: Admit: 2020-07-10 | Discharge status: Absent without leave | Payer: Medicaid Other

## 2020-07-26 DIAGNOSIS — Z419 Encounter for procedure for purposes other than remedying health state, unspecified: Secondary | ICD-10-CM | POA: Diagnosis not present

## 2020-08-25 DIAGNOSIS — Z419 Encounter for procedure for purposes other than remedying health state, unspecified: Secondary | ICD-10-CM | POA: Diagnosis not present

## 2020-09-25 DIAGNOSIS — Z419 Encounter for procedure for purposes other than remedying health state, unspecified: Secondary | ICD-10-CM | POA: Diagnosis not present

## 2020-10-25 DIAGNOSIS — Z419 Encounter for procedure for purposes other than remedying health state, unspecified: Secondary | ICD-10-CM | POA: Diagnosis not present

## 2021-01-15 ENCOUNTER — Telehealth: Payer: Self-pay

## 2021-01-15 NOTE — Telephone Encounter (Signed)
Transition Care Management Unsuccessful Follow-up Telephone Call  Date of discharge and from where:  01/14/2021-[facility]   Attempts:  1st Attempt  Reason for unsuccessful TCM follow-up call:  Left voice message

## 2021-01-16 NOTE — Telephone Encounter (Signed)
Transition Care Management Follow-up Telephone Call Date of discharge and from where: 01/14/2021-[facility] How have you been since you were released from the hospital? Patient stated she is now at urgent care because she don't feel better.  Any questions or concerns? No  Items Reviewed: Did the pt receive and understand the discharge instructions provided? Yes  Medications obtained and verified? Yes  Other? No  Any new allergies since your discharge? No  Dietary orders reviewed? N/A Do you have support at home? Yes   Home Care and Equipment/Supplies: Were home health services ordered? not applicable If so, what is the name of the agency? N/A  Has the agency set up a time to come to the patient's home? not applicable Were any new equipment or medical supplies ordered?  No What is the name of the medical supply agency? N/A Were you able to get the supplies/equipment? not applicable Do you have any questions related to the use of the equipment or supplies? No  Functional Questionnaire: (I = Independent and D = Dependent) ADLs: I  Bathing/Dressing- I  Meal Prep- I  Eating- I  Maintaining continence- I  Transferring/Ambulation- I  Managing Meds- I  Follow up appointments reviewed:  PCP Hospital f/u appt confirmed? No   Specialist Hospital f/u appt confirmed? No   Are transportation arrangements needed? No  If their condition worsens, is the pt aware to call PCP or go to the Emergency Dept.? Yes Was the patient provided with contact information for the PCP's office or ED? Yes Was to pt encouraged to call back with questions or concerns? Yes

## 2021-04-02 ENCOUNTER — Telehealth: Payer: Self-pay

## 2021-04-02 NOTE — Telephone Encounter (Signed)
Transition Care Management Unsuccessful Follow-up Telephone Call  Date of discharge and from where:  04/01/2021 from [facility]  Attempts:  1st Attempt  Reason for unsuccessful TCM follow-up call:  Left voice message

## 2021-04-03 NOTE — Telephone Encounter (Signed)
Transition Care Management Unsuccessful Follow-up Telephone Call  Date of discharge and from where:  04/01/2021 from [facility]  Attempts:  2nd Attempt  Reason for unsuccessful TCM follow-up call:  Left voice message

## 2021-04-04 NOTE — Telephone Encounter (Signed)
Transition Care Management Unsuccessful Follow-up Telephone Call  Date of discharge and from where:  04/01/2021-[facility]  Attempts:  3rd Attempt  Reason for unsuccessful TCM follow-up call:  Left voice message

## 2021-09-03 ENCOUNTER — Telehealth: Payer: Self-pay

## 2021-09-03 NOTE — Telephone Encounter (Signed)
Transition Care Management Unsuccessful Follow-up Telephone Call ? ?Date of discharge and from where:  09/02/2021 from [facility] ? ?Attempts:  1st Attempt ? ?Reason for unsuccessful TCM follow-up call:  Left voice message ? ? ? ?

## 2021-09-04 NOTE — Telephone Encounter (Signed)
Transition Care Management Unsuccessful Follow-up Telephone Call ? ?Date of discharge and from where:   09/02/2021 from [facility] ? ?Attempts:  2nd Attempt ? ?Reason for unsuccessful TCM follow-up call:  Left voice message ? ? ? ?

## 2021-09-05 NOTE — Telephone Encounter (Signed)
Transition Care Management Unsuccessful Follow-up Telephone Call ? ?Date of discharge and from where:  09/02/2021 from [facility] ? ?Attempts:  3rd Attempt ? ?Reason for unsuccessful TCM follow-up call:  Unable to reach patient ? ? ? ?

## 2022-04-07 ENCOUNTER — Encounter: Payer: Medicaid Other | Admitting: Family Medicine

## 2022-04-07 NOTE — Progress Notes (Signed)
Error- Needs to schedule under her grandson's chart

## 2022-09-30 ENCOUNTER — Other Ambulatory Visit: Payer: Self-pay

## 2022-09-30 ENCOUNTER — Encounter (HOSPITAL_COMMUNITY): Payer: Self-pay

## 2022-09-30 ENCOUNTER — Emergency Department (HOSPITAL_COMMUNITY)
Admission: EM | Admit: 2022-09-30 | Discharge: 2022-09-30 | Disposition: A | Discharge status: Home / Self Care | Payer: Self-pay | Attending: Emergency Medicine | Admitting: Emergency Medicine

## 2022-09-30 DIAGNOSIS — R0602 Shortness of breath: Secondary | ICD-10-CM | POA: Insufficient documentation

## 2022-09-30 DIAGNOSIS — F17200 Nicotine dependence, unspecified, uncomplicated: Secondary | ICD-10-CM | POA: Insufficient documentation

## 2022-09-30 DIAGNOSIS — J029 Acute pharyngitis, unspecified: Secondary | ICD-10-CM

## 2022-09-30 DIAGNOSIS — M542 Cervicalgia: Secondary | ICD-10-CM | POA: Insufficient documentation

## 2022-09-30 LAB — GROUP A STREP BY PCR: Group A Strep by PCR: NOT DETECTED

## 2022-09-30 MED ORDER — IBUPROFEN 400 MG PO TABS: 400.0000 mg | ORAL_TABLET | Freq: Four times a day (QID) | ORAL | 0 refills | Status: AC | PRN

## 2022-09-30 NOTE — Discharge Instructions (Addendum)
Your Strep test is negative, which means you do not need any antibiotics. Symptoms are likely due to a viral infection which will resolve within 7-10 days.   Take 400mg  ibuprofen every 6 hours as needed for pain.   Follow up with PCP as needed  Return to the ER should you have any difficulty breathing or any other symptoms that concern you. Follow up with PCP if symptoms have not significantly improved in the next 5 days.

## 2022-09-30 NOTE — ED Triage Notes (Signed)
Patient complains starting on Monday feeling a lump on the left side of her throat making swallowing difficult. Patient took a covid test Monday that was negative

## 2022-09-30 NOTE — ED Provider Notes (Signed)
[name] EMERGENCY DEPARTMENT AT [facility] Provider Note   CSN: [id number] Arrival date & time: 09/30/22  7829     History  No chief complaint on file.   Ruth Mullen is a 37 y.o. female presenting with 4 days of left-sided neck pain.  She also notes some shortness of breath upon walking into the ER but is unsure if this is because she walked fast. She denies any fevers, difficulty swallowing, difficulty breathing.  She denies swallowing anything that may have gotten stuck. She reports needing dental work on the left side of her mouth but has not had any more dental pain than normal.  She has tried amoxicillin she had at home without improvement. She took ibuprofen this morning with mild improvement in her pain. Patient reports negative COVID test 4 days ago.  HPI     Home Medications Prior to Admission medications   Medication Sig Start Date End Date Taking? Authorizing Provider  ibuprofen (ADVIL) 400 MG tablet Take 1 tablet (400 mg total) by mouth every 6 (six) hours as needed for up to 5 days for moderate pain. 09/30/22 10/05/22 Yes Arabella Merles, PA-C  acyclovir (ZOVIRAX) 400 MG tablet Take 500 mg by mouth continuous as needed (outbreak).    [provider]  albuterol (PROVENTIL) (2.5 MG/3ML) 0.083% nebulizer solution Take 3 mLs (2.5 mg total) by nebulization every 6 (six) hours as needed for wheezing or shortness of breath. Patient not taking: Reported on 09/30/2022 08/23/19   Jannifer Rodney A, FNP  pantoprazole (PROTONIX) 40 MG tablet Take 1 tablet (40 mg total) by mouth daily. Patient not taking: No sig reported 11/01/18 06/20/20  Maurilio Lovely D, DO      Allergies    Clindamycin/lincomycin    Review of Systems   Review of Systems  HENT:  Positive for sore throat. Negative for trouble swallowing.     Physical Exam Updated Vital Signs BP (!) 137/102   Pulse 84   Temp 97.8 F (36.6 C) (Oral)   Resp 18   Ht 5' (1.524 m)   Wt 57.2 kg   LMP  09/06/2022   SpO2 100%   BMI 24.61 kg/m  Physical Exam Constitutional:      General: She is not in acute distress.    Appearance: Normal appearance.  HENT:     Head: Atraumatic.     Comments: Appropriate dentition with no erythema or edema of the gums    Mouth/Throat:     Mouth: Mucous membranes are moist.     Pharynx: Oropharynx is clear. Posterior oropharyngeal erythema present. No oropharyngeal exudate.  Neck:     Comments: Mild left cervical lymphadenopathy and tenderness Cardiovascular:     Rate and Rhythm: Normal rate and regular rhythm.     Heart sounds: Normal heart sounds.  Pulmonary:     Effort: Pulmonary effort is normal.     Breath sounds: Normal breath sounds.  Abdominal:     General: Abdomen is flat.     Palpations: Abdomen is soft.  Musculoskeletal:     Cervical back: Normal range of motion and neck supple. Tenderness present. No rigidity.  Skin:    Findings: No erythema.  Neurological:     General: No focal deficit present.     Mental Status: She is alert.     ED Results / Procedures / Treatments   Labs (all labs ordered are listed, but only abnormal results are displayed) Labs Reviewed  GROUP A STREP BY PCR  EKG None  Radiology No results found.  Procedures Procedures    Medications Ordered in ED Medications - No data to display  ED Course/ Medical Decision Making/ A&P                             Medical Decision Making Risk Prescription drug management.  This patient presents to the ED for concern of left-sided neck pain most concerning for a viral upper respiratory infection causing her symptoms.  Other differentials considered include strep pharyngitis, ludwig angina, dental infection, foreign object.  Patient has negative strep test.  On exam her neck is supple with no erythema or edema, patient with appropriate neck ROM, making Ludwig angina less likely.  Dentition appropriate with no concern for dental infection at this  time.   Lab Tests:  I Ordered, and personally interpreted labs.  The pertinent results include: Negative strep   Medicines ordered and prescription drug management:  No medications given in the ER I have reviewed the patients home medicines and have made adjustments as needed   Problem List / ED Course:  Left sided neck pain  Patient with reassuring physical exam and negative strep test. Suspect viral cause of lymphadenopathy causing her symptoms She understands to follow up with PCP should symptoms not significantly improve in the next 5 days May take ibuprofen 400mg  q6 hrs as needed for pain   Social Determinants of Health:  Tobacco use           Final Clinical Impression(s) / ED Diagnoses Final diagnoses:  None    Rx / DC Orders ED Discharge Orders          Ordered    ibuprofen (ADVIL) 400 MG tablet  Every 6 hours PRN        09/30/22 1032              Arabella Merles, PA-C 09/30/22 1118    Eber Hong, MD 10/01/22 [phone]

## 2023-05-19 ENCOUNTER — Emergency Department (HOSPITAL_COMMUNITY)
Admission: EM | Admit: 2023-05-19 | Discharge: 2023-05-20 | Discharge status: Against Medical Advice | Payer: Self-pay | Attending: Emergency Medicine | Admitting: Emergency Medicine

## 2023-05-19 ENCOUNTER — Encounter (HOSPITAL_COMMUNITY): Payer: Self-pay

## 2023-05-19 ENCOUNTER — Other Ambulatory Visit: Payer: Self-pay

## 2023-05-19 DIAGNOSIS — R1030 Lower abdominal pain, unspecified: Secondary | ICD-10-CM | POA: Insufficient documentation

## 2023-05-19 DIAGNOSIS — Z5321 Procedure and treatment not carried out due to patient leaving prior to being seen by health care provider: Secondary | ICD-10-CM | POA: Insufficient documentation

## 2023-05-19 LAB — CBC WITH DIFFERENTIAL/PLATELET
Abs Immature Granulocytes: 0 10*3/uL (ref 0.00–0.07)
Basophils Absolute: 0 10*3/uL (ref 0.0–0.1)
Basophils Relative: 0 %
Eosinophils Absolute: 0.1 10*3/uL (ref 0.0–0.5)
Eosinophils Relative: 1 %
HCT: 40.8 % (ref 36.0–46.0)
Hemoglobin: 13 g/dL (ref 12.0–15.0)
Lymphocytes Relative: 30 %
Lymphs Abs: 3.8 10*3/uL (ref 0.7–4.0)
MCH: 25.4 pg — ABNORMAL LOW (ref 26.0–34.0)
MCHC: 31.9 g/dL (ref 30.0–36.0)
MCV: 79.7 fL — ABNORMAL LOW (ref 80.0–100.0)
Monocytes Absolute: 0.4 10*3/uL (ref 0.1–1.0)
Monocytes Relative: 3 %
Neutro Abs: 8.3 10*3/uL — ABNORMAL HIGH (ref 1.7–7.7)
Neutrophils Relative %: 66 %
Platelets: 327 10*3/uL (ref 150–400)
RBC: 5.12 MIL/uL — ABNORMAL HIGH (ref 3.87–5.11)
RDW: 15.1 % (ref 11.5–15.5)
WBC: 12.5 10*3/uL — ABNORMAL HIGH (ref 4.0–10.5)
nRBC: 0 % (ref 0.0–0.2)

## 2023-05-19 LAB — COMPREHENSIVE METABOLIC PANEL
ALT: 8 U/L (ref 0–44)
AST: 15 U/L (ref 15–41)
Albumin: 4.2 g/dL (ref 3.5–5.0)
Alkaline Phosphatase: 64 U/L (ref 38–126)
Anion gap: 10 (ref 5–15)
BUN: 6 mg/dL (ref 6–20)
CO2: 26 mmol/L (ref 22–32)
Calcium: 8.9 mg/dL (ref 8.9–10.3)
Chloride: 102 mmol/L (ref 98–111)
Creatinine, Ser: 0.53 mg/dL (ref 0.44–1.00)
GFR, Estimated: >60 mL/min (ref >60)
Glucose, Bld: 91 mg/dL (ref 70–99)
Potassium: 4 mmol/L (ref 3.5–5.1)
Sodium: 138 mmol/L (ref 135–145)
Total Bilirubin: 0.6 mg/dL (ref 0.0–1.2)
Total Protein: 7.2 g/dL (ref 6.5–8.1)

## 2023-05-19 LAB — LIPASE, BLOOD: Lipase: 34 U/L (ref 11–51)

## 2023-05-19 NOTE — ED Triage Notes (Signed)
Pt arrived via POV from home c/o lower abdominal pain and reports swelling to the glands in her groin area. Denies N/V/D. Pain began yesterday.

## 2023-05-20 NOTE — ED Notes (Signed)
Pt name called in lobby - no answer- pt not in lobby

## 2023-05-20 NOTE — ED Notes (Signed)
Pt name called in lobby -no answer.

## 2023-05-20 NOTE — ED Notes (Signed)
This nurse called pt name in lobby- no answer- registration said they saw pt walking outside.

## 2023-12-14 ENCOUNTER — Ambulatory Visit: Payer: Self-pay | Admitting: Nurse Practitioner

## 2023-12-22 ENCOUNTER — Encounter: Payer: Self-pay | Admitting: Nurse Practitioner

## 2024-02-04 ENCOUNTER — Emergency Department (HOSPITAL_COMMUNITY): Admitting: Anesthesiology

## 2024-02-04 ENCOUNTER — Other Ambulatory Visit: Payer: Self-pay

## 2024-02-04 ENCOUNTER — Inpatient Hospital Stay (HOSPITAL_COMMUNITY)

## 2024-02-04 ENCOUNTER — Encounter (HOSPITAL_COMMUNITY): Admission: EM | Disposition: A | Payer: Self-pay | Source: Home / Self Care | Attending: Neurology

## 2024-02-04 ENCOUNTER — Inpatient Hospital Stay (HOSPITAL_COMMUNITY)
Admission: EM | Admit: 2024-02-04 | Discharge: 2024-02-08 | DRG: 024 | Disposition: A | Discharge status: Home / Self Care | Attending: Neurology | Admitting: Neurology

## 2024-02-04 ENCOUNTER — Emergency Department (HOSPITAL_COMMUNITY)

## 2024-02-04 ENCOUNTER — Encounter (HOSPITAL_COMMUNITY): Payer: Self-pay | Admitting: Emergency Medicine

## 2024-02-04 DIAGNOSIS — R29810 Facial weakness: Secondary | ICD-10-CM | POA: Diagnosis present

## 2024-02-04 DIAGNOSIS — E785 Hyperlipidemia, unspecified: Secondary | ICD-10-CM | POA: Diagnosis present

## 2024-02-04 DIAGNOSIS — F17210 Nicotine dependence, cigarettes, uncomplicated: Secondary | ICD-10-CM | POA: Diagnosis present

## 2024-02-04 DIAGNOSIS — F32A Depression, unspecified: Secondary | ICD-10-CM | POA: Diagnosis present

## 2024-02-04 DIAGNOSIS — D72829 Elevated white blood cell count, unspecified: Secondary | ICD-10-CM | POA: Diagnosis present

## 2024-02-04 DIAGNOSIS — R29724 NIHSS score 24: Secondary | ICD-10-CM | POA: Diagnosis present

## 2024-02-04 DIAGNOSIS — F1210 Cannabis abuse, uncomplicated: Secondary | ICD-10-CM | POA: Diagnosis present

## 2024-02-04 DIAGNOSIS — I639 Cerebral infarction, unspecified: Principal | ICD-10-CM | POA: Diagnosis present

## 2024-02-04 DIAGNOSIS — Z7982 Long term (current) use of aspirin: Secondary | ICD-10-CM | POA: Diagnosis not present

## 2024-02-04 DIAGNOSIS — Z8249 Family history of ischemic heart disease and other diseases of the circulatory system: Secondary | ICD-10-CM | POA: Diagnosis not present

## 2024-02-04 DIAGNOSIS — E876 Hypokalemia: Secondary | ICD-10-CM | POA: Diagnosis present

## 2024-02-04 DIAGNOSIS — F418 Other specified anxiety disorders: Secondary | ICD-10-CM | POA: Diagnosis not present

## 2024-02-04 DIAGNOSIS — G8194 Hemiplegia, unspecified affecting left nondominant side: Secondary | ICD-10-CM | POA: Diagnosis present

## 2024-02-04 DIAGNOSIS — R29700 NIHSS score 0: Secondary | ICD-10-CM | POA: Diagnosis not present

## 2024-02-04 DIAGNOSIS — I63411 Cerebral infarction due to embolism of right middle cerebral artery: Secondary | ICD-10-CM | POA: Diagnosis not present

## 2024-02-04 DIAGNOSIS — I6601 Occlusion and stenosis of right middle cerebral artery: Secondary | ICD-10-CM

## 2024-02-04 DIAGNOSIS — I63511 Cerebral infarction due to unspecified occlusion or stenosis of right middle cerebral artery: Secondary | ICD-10-CM | POA: Diagnosis present

## 2024-02-04 DIAGNOSIS — R4182 Altered mental status, unspecified: Secondary | ICD-10-CM

## 2024-02-04 DIAGNOSIS — I10 Essential (primary) hypertension: Secondary | ICD-10-CM | POA: Diagnosis present

## 2024-02-04 DIAGNOSIS — Z7902 Long term (current) use of antithrombotics/antiplatelets: Secondary | ICD-10-CM | POA: Diagnosis not present

## 2024-02-04 DIAGNOSIS — I6389 Other cerebral infarction: Secondary | ICD-10-CM | POA: Diagnosis not present

## 2024-02-04 HISTORY — PX: RADIOLOGY WITH ANESTHESIA: SHX6223

## 2024-02-04 HISTORY — PX: IR PERCUTANEOUS ART THROMBECTOMY/INFUSION INTRACRANIAL INC DIAG ANGIO: IMG6087

## 2024-02-04 HISTORY — PX: IR US GUIDE VASC ACCESS RIGHT: IMG2390

## 2024-02-04 LAB — I-STAT CHEM 8, ED
BUN: 4 mg/dL — ABNORMAL LOW (ref 6–20)
Calcium, Ion: 0.96 mmol/L — ABNORMAL LOW (ref 1.15–1.40)
Chloride: 106 mmol/L (ref 98–111)
Creatinine, Ser: 0.6 mg/dL (ref 0.44–1.00)
Glucose, Bld: 118 mg/dL — ABNORMAL HIGH (ref 70–99)
HCT: 43 % (ref 36.0–46.0)
Hemoglobin: 14.6 g/dL (ref 12.0–15.0)
Potassium: 3.7 mmol/L (ref 3.5–5.1)
Sodium: 137 mmol/L (ref 135–145)
TCO2: 23 mmol/L (ref 22–32)

## 2024-02-04 LAB — CBC
HCT: 40.4 % (ref 36.0–46.0)
Hemoglobin: 13.5 g/dL (ref 12.0–15.0)
MCH: 26.1 pg (ref 26.0–34.0)
MCHC: 33.4 g/dL (ref 30.0–36.0)
MCV: 78 fL — ABNORMAL LOW (ref 80.0–100.0)
Platelets: 486 K/uL — ABNORMAL HIGH (ref 150–400)
RBC: 5.18 MIL/uL — ABNORMAL HIGH (ref 3.87–5.11)
RDW: 15 % (ref 11.5–15.5)
WBC: 10.9 K/uL — ABNORMAL HIGH (ref 4.0–10.5)
nRBC: 0 % (ref 0.0–0.2)

## 2024-02-04 LAB — DIFFERENTIAL
Abs Immature Granulocytes: 0.03 K/uL (ref 0.00–0.07)
Basophils Absolute: 0.1 K/uL (ref 0.0–0.1)
Basophils Relative: 1 %
Eosinophils Absolute: 0.1 K/uL (ref 0.0–0.5)
Eosinophils Relative: 1 %
Immature Granulocytes: 0 %
Lymphocytes Relative: 47 %
Lymphs Abs: 5.1 K/uL — ABNORMAL HIGH (ref 0.7–4.0)
Monocytes Absolute: 0.5 K/uL (ref 0.1–1.0)
Monocytes Relative: 5 %
Neutro Abs: 5.1 K/uL (ref 1.7–7.7)
Neutrophils Relative %: 46 %

## 2024-02-04 LAB — PROTIME-INR
INR: 1 (ref 0.8–1.2)
Prothrombin Time: 13.8 s (ref 11.4–15.2)

## 2024-02-04 LAB — COMPREHENSIVE METABOLIC PANEL WITH GFR
ALT: 17 U/L (ref 0–44)
AST: 28 U/L (ref 15–41)
Albumin: 4.5 g/dL (ref 3.5–5.0)
Alkaline Phosphatase: 74 U/L (ref 38–126)
Anion gap: 17 — ABNORMAL HIGH (ref 5–15)
BUN: 5 mg/dL — ABNORMAL LOW (ref 6–20)
CO2: 22 mmol/L (ref 22–32)
Calcium: 9.6 mg/dL (ref 8.9–10.3)
Chloride: 102 mmol/L (ref 98–111)
Creatinine, Ser: 0.65 mg/dL (ref 0.44–1.00)
GFR, Estimated: >60 mL/min (ref >60)
Glucose, Bld: 115 mg/dL — ABNORMAL HIGH (ref 70–99)
Potassium: 2.9 mmol/L — ABNORMAL LOW (ref 3.5–5.1)
Sodium: 141 mmol/L (ref 135–145)
Total Bilirubin: 0.3 mg/dL (ref 0.0–1.2)
Total Protein: 7.9 g/dL (ref 6.5–8.1)

## 2024-02-04 LAB — MRSA NEXT GEN BY PCR, NASAL: MRSA by PCR Next Gen: NOT DETECTED

## 2024-02-04 LAB — APTT: aPTT: <22 s — ABNORMAL LOW (ref 24–36)

## 2024-02-04 LAB — ETHANOL: Alcohol, Ethyl (B): <15 mg/dL (ref <15)

## 2024-02-04 SURGERY — RADIOLOGY WITH ANESTHESIA
Anesthesia: General

## 2024-02-04 MED ORDER — CHLORHEXIDINE GLUCONATE CLOTH 2 % EX PADS
6.0000 | MEDICATED_PAD | Freq: Every day | CUTANEOUS | Status: DC
  Administered 2024-02-04 – 2024-02-06 (×3): 6 via TOPICAL

## 2024-02-04 MED ORDER — CLEVIDIPINE BUTYRATE 0.5 MG/ML IV EMUL: 0.0000 mg/h | INTRAVENOUS | Status: DC

## 2024-02-04 MED ORDER — LIDOCAINE 2% (20 MG/ML) 5 ML SYRINGE
INTRAMUSCULAR | Status: DC | PRN
  Administered 2024-02-04: 40 mg via INTRAVENOUS

## 2024-02-04 MED ORDER — TENECTEPLASE FOR STROKE: 0.2500 mg/kg | PACK | Freq: Once | INTRAVENOUS | Status: AC

## 2024-02-04 MED ORDER — TENECTEPLASE FOR STROKE
PACK | INTRAVENOUS | Status: AC
  Administered 2024-02-04: 15 mg via INTRAVENOUS
  Filled 2024-02-04: qty 10

## 2024-02-04 MED ORDER — DEXAMETHASONE SOD PHOSPHATE PF 10 MG/ML IJ SOLN
INTRAMUSCULAR | Status: DC | PRN
  Administered 2024-02-04: 10 mg via INTRAVENOUS

## 2024-02-04 MED ORDER — STROKE: EARLY STAGES OF RECOVERY BOOK
Freq: Once | Status: AC
  Filled 2024-02-04: qty 1

## 2024-02-04 MED ORDER — ROCURONIUM BROMIDE 10 MG/ML (PF) SYRINGE
PREFILLED_SYRINGE | INTRAVENOUS | Status: DC | PRN
  Administered 2024-02-04: 50 mg via INTRAVENOUS

## 2024-02-04 MED ORDER — ACETAMINOPHEN 325 MG PO TABS
650.0000 mg | ORAL_TABLET | ORAL | Status: DC | PRN
  Administered 2024-02-05 (×2): 650 mg via ORAL
  Filled 2024-02-04 (×2): qty 2

## 2024-02-04 MED ORDER — ORAL CARE MOUTH RINSE: 15.0000 mL | OROMUCOSAL | Status: DC | PRN

## 2024-02-04 MED ORDER — PHENYLEPHRINE HCL-NACL 20-0.9 MG/250ML-% IV SOLN
INTRAVENOUS | Status: DC | PRN
Start: 2024-02-04 — End: 2024-02-04
  Administered 2024-02-04: 35 ug/min via INTRAVENOUS

## 2024-02-04 MED ORDER — SODIUM CHLORIDE 0.9 % IV SOLN: INTRAVENOUS | Status: DC

## 2024-02-04 MED ORDER — ACETAMINOPHEN 160 MG/5ML PO SOLN: 650.0000 mg | ORAL | Status: DC | PRN

## 2024-02-04 MED ORDER — HYDRALAZINE HCL 20 MG/ML IJ SOLN: 20.0000 mg | INTRAMUSCULAR | Status: DC | PRN

## 2024-02-04 MED ORDER — PANTOPRAZOLE SODIUM 40 MG IV SOLR
40.0000 mg | Freq: Every day | INTRAVENOUS | Status: DC
  Administered 2024-02-04 – 2024-02-07 (×4): 40 mg via INTRAVENOUS
  Filled 2024-02-04 (×4): qty 10

## 2024-02-04 MED ORDER — ACETAMINOPHEN 650 MG RE SUPP: 650.0000 mg | RECTAL | Status: DC | PRN

## 2024-02-04 MED ORDER — ONDANSETRON HCL 4 MG/2ML IJ SOLN
INTRAMUSCULAR | Status: DC | PRN
  Administered 2024-02-04: 4 mg via INTRAVENOUS

## 2024-02-04 MED ORDER — PHENYLEPHRINE 80 MCG/ML (10ML) SYRINGE FOR IV PUSH (FOR BLOOD PRESSURE SUPPORT)
PREFILLED_SYRINGE | INTRAVENOUS | Status: DC | PRN
  Administered 2024-02-04: 120 ug via INTRAVENOUS
  Administered 2024-02-04: 80 ug via INTRAVENOUS

## 2024-02-04 MED ORDER — DEXMEDETOMIDINE HCL IN NACL 200 MCG/50ML IV SOLN
INTRAVENOUS | Status: AC
  Filled 2024-02-04: qty 50

## 2024-02-04 MED ORDER — IOHEXOL 300 MG/ML  SOLN
150.0000 mL | Freq: Once | INTRAMUSCULAR | Status: AC | PRN
  Administered 2024-02-04: 15 mL via INTRA_ARTERIAL

## 2024-02-04 MED ORDER — SODIUM CHLORIDE 0.9 % IV BOLUS: 250.0000 mL | INTRAVENOUS | Status: DC | PRN

## 2024-02-04 MED ORDER — LACTATED RINGERS IV SOLN: INTRAVENOUS | Status: DC | PRN

## 2024-02-04 MED ORDER — SUCCINYLCHOLINE CHLORIDE 200 MG/10ML IV SOSY
PREFILLED_SYRINGE | INTRAVENOUS | Status: DC | PRN
  Administered 2024-02-04: 200 mg via INTRAVENOUS

## 2024-02-04 MED ORDER — IOHEXOL 350 MG/ML SOLN
100.0000 mL | Freq: Once | INTRAVENOUS | Status: AC | PRN
  Administered 2024-02-04: 100 mL via INTRAVENOUS

## 2024-02-04 MED ORDER — PROPOFOL 10 MG/ML IV BOLUS
INTRAVENOUS | Status: DC | PRN
Start: 2024-02-04 — End: 2024-02-04
  Administered 2024-02-04: 130 mg via INTRAVENOUS

## 2024-02-04 MED ORDER — LABETALOL HCL 5 MG/ML IV SOLN: 20.0000 mg | INTRAVENOUS | Status: DC | PRN

## 2024-02-04 MED ORDER — SUGAMMADEX SODIUM 200 MG/2ML IV SOLN
INTRAVENOUS | Status: DC | PRN
  Administered 2024-02-04: 200 mg via INTRAVENOUS

## 2024-02-04 MED ORDER — SENNOSIDES-DOCUSATE SODIUM 8.6-50 MG PO TABS: 1.0000 | ORAL_TABLET | Freq: Every evening | ORAL | Status: DC | PRN

## 2024-02-04 NOTE — Anesthesia Preprocedure Evaluation (Addendum)
 Anesthesia Evaluation  Patient identified by MRN, date of birth, ID band  Reviewed: Allergy & Precautions, Patient's Chart, lab work & pertinent test resultsPreop documentation limited or incomplete due to emergent nature of procedure.  Airway Mallampati: II  TM Distance: >3 FB Neck ROM: Full    Dental  (+) Dental Advisory Given   Pulmonary Current SmokerPatient did not abstain from smoking.   Pulmonary exam normal breath sounds clear to auscultation       Cardiovascular hypertension,  Rhythm:Regular Rate:Normal     Neuro/Psych  PSYCHIATRIC DISORDERS Anxiety Depression    negative neurological ROS     GI/Hepatic negative GI ROS,,,(+)     substance abuse  marijuana use  Endo/Other  negative endocrine ROS    Renal/GU negative Renal ROS  negative genitourinary   Musculoskeletal negative musculoskeletal ROS (+)    Abdominal   Peds  Hematology negative hematology ROS (+)   Anesthesia Other Findings Acute stroke  Reproductive/Obstetrics                              Anesthesia Physical Anesthesia Plan  ASA: 2 and emergent  Anesthesia Plan: General   Post-op Pain Management: Minimal or no pain anticipated   Induction: Intravenous, Rapid sequence and Cricoid pressure planned  PONV Risk Score and Plan: 2 and Ondansetron , Treatment may vary due to age or medical condition and Dexamethasone  Airway Management Planned: Oral ETT  Additional Equipment:   Intra-op Plan:   Post-operative Plan: Extubation in OR and Possible Post-op intubation/ventilation  Informed Consent: I have reviewed the patients History and Physical, chart, labs and discussed the procedure including the risks, benefits and alternatives for the proposed anesthesia with the patient or authorized representative who has indicated his/her understanding and acceptance.     Dental advisory given  Plan Discussed with:  CRNA  Anesthesia Plan Comments:          Anesthesia Quick Evaluation

## 2024-02-04 NOTE — ED Notes (Addendum)
 Son at bedside, he brought her to the ED.Pt placed on cardiac monitor, CBG was 119.

## 2024-02-04 NOTE — Consult Note (Signed)
 [facility] Telemedicine Consult   Requesting Provider: Simon Rea Consult Participants: Patient, son, bedside nursing Location of the provider: [facility] Location of the patient: [facility]  This consult was provided via telemedicine with 2-way video and audio communication. The patient/family was informed that care would be provided in this way and agreed to receive care in this manner.    Chief Complaint: Left sided weakness  HPI: 38 year old female with a history of hypertension who presents with left-sided weakness.  She was last seen well by her son around 1230, but apparently talk to her mother on the phone at 1410 and was up and about at that time.  She has been having some headaches this weekend, and thought maybe her blood pressure but did not check it.    LKW: 14:10 tnk given?: yes IR Thrombectomy? yes Modified Rankin Scale: 0-Completely asymptomatic and back to baseline post- stroke TNK given: 1504  Exam: Vitals:   02/04/24 1515 02/04/24 1526  BP: 120/82 109/60  Pulse:  85  Resp: (!) 22 18  Temp:  97.6 F (36.4 C)  SpO2:  98%     General: in bed  1A: Level of Consciousness - 0 1B: Ask Month and Age - 0 1C: 'Blink Eyes' & 'Squeeze Hands' - 0 2: Test Horizontal Extraocular Movements - 2 3: Test Visual Fields - 2 4: Test Facial Palsy - 1 5A: Test Left Arm Motor Drift - 4 5B: Test Right Arm Motor Drift - 0 6A: Test Left Leg Motor Drift - 4 6B: Test Right Leg Motor Drift - 0 7: Test Limb Ataxia - 0 8: Test Sensation - 2 9: Test Language/Aphasia- 0 10: Test Dysarthria - 0 11: Test Extinction/Inattention - 2 NIHSS score: 16   Imaging Reviewed: CT - negative CTA - R distal MCA occlusion vs stenosis.   Labs reviewed in epic and pertinent values follow: CBG 119   Assessment: 38 yo F with right MCA occlusion.  I reviewed the films prior to TNK administration, and discussed the risk, benefits, and alternatives of IV TNK with the patient's son.   Following administration, her CTA was reviewed and she was found to have a proximal M2 occlusion.  Three-way call with [facility], Dr. Lester discussed the thrombectomy procedure with the patient son who agreed with proceeding with procedure and she is being transferred emergently to [facility] for thrombectomy.  Recommendations:  1) 1 L normal saline now 2) emergent transfer to [facility] for thrombectomy  3) I have discussed the patient with both neurointerventional radiology as well as neurology at [facility] 4) blood pressure will need to be maintained less than on180/105 5) no antiplatelets or anticoagulants for 24 hours 6) she will need ICU admission following thrombectomy.   This patient is critically ill and at significant risk of neurological worsening, death and care requires constant monitoring of vital signs, hemodynamics,respiratory and cardiac monitoring, neurological assessment, discussion with family, other specialists and medical decision making of high complexity. I spent 52 minutes of neurocritical care time  in the care of  this patient. This was time spent independent of any time provided by nurse practitioner or PA.  Aisha Seals, MD [facility]   If 7pm- 7am, please page neurology on call as listed in [facility]. 02/04/2024  3:40 PM

## 2024-02-04 NOTE — ED Notes (Signed)
 Report to ems, pt from department.

## 2024-02-04 NOTE — Anesthesia Procedure Notes (Signed)
 Procedure Name: Intubation Date/Time: 02/04/2024 4:06 PM  Performed by: Mannie Krystal LABOR, CRNAPre-anesthesia Checklist: Patient identified, Emergency Drugs available, Suction available and Patient being monitored Patient Re-evaluated:Patient Re-evaluated prior to induction Oxygen Delivery Method: Circle system utilized Preoxygenation: Pre-oxygenation with 100% oxygen Induction Type: IV induction and Rapid sequence Laryngoscope Size: Miller and 2 Grade View: Grade I Tube type: Oral Tube size: 7.0 mm Number of attempts: 1 Airway Equipment and Method: Stylet Placement Confirmation: ETT inserted through vocal cords under direct vision, positive ETCO2 and breath sounds checked- equal and bilateral Secured at: 22 cm Tube secured with: Tape Dental Injury: Teeth and Oropharynx as per pre-operative assessment

## 2024-02-04 NOTE — ED Triage Notes (Signed)
 Pt to er room number 10 md at bedside, pt is a code stoke.

## 2024-02-04 NOTE — H&P (Addendum)
 NEUROLOGY H&P NOTE   Date of service: February 04, 2024 Patient Name: Ruth Mullen MRN:  [id number] DOB:  08-31-1985 Chief Complaint: Code Stroke  History of Present Illness  Ruth Mullen is a 38 y.o. female with hx of hypertension presenting with left-sided weakness.  She was last seen well by her son around 1230 but had talked to her mother at the phone around 1410 and was walking around at that time.  She presented to [facility] and was activated as a code stroke.  She was seen by [facility] telemedicine and was in the window for TNK.  TNK was given at 1504.  Her NIH was 16.  CTA showed a right distal MCA occlusion versus stenosis and she was activated as a code IR and was transferred to [facility].   Last known well: 1410 Modified rankin score: 0-Completely asymptomatic and back to baseline post- stroke IV Thrombolysis: Yes, completed at [facility] Thrombectomy: Yes  NIHSS components Score: Comment  1a Level of Conscious 0[x]  1[]  2[]  3[]      1b LOC Questions 0[]  1[]  2[x]     Did not answer questions on arrival to [facility]  1c LOC Commands 0[x]  1[]  2[]       2 Best Gaze 0[]  1[]  2[x]       3 Visual 0[]  1[]  2[x]  3[]      4 Facial Palsy 0[]  1[x]  2[]  3[]      5a Motor Arm - left 0[]  1[]  2[]  3[]  4[x]  UN[]    5b Motor Arm - Right 0[x]  1[]  2[]  3[]  4[]  UN[]    6a Motor Leg - Left 0[]  1[]  2[]  3[]  4[x]  UN[]    6b Motor Leg - Right 0[x]  1[]  2[]  3[]  4[]  UN[]    7 Limb Ataxia 0[x]  1[]  2[]  UN[]      8 Sensory 0[]  1[]  2[x]  UN[]      9 Best Language 0[]  1[]  2[]  3[x]      10 Dysarthria 0[]  1[]  2[x]  UN[]      11 Extinct. 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   Hypertension    Menorrhagia with regular cycle 11/19/2015   Vaginal discharge 09/07/2013   Past Surgical History:  Procedure Laterality Date   CERVICAL CERCLAGE N/A 02/08/2013   Procedure: CERCLAGE CERVICAL MCDONALD;  Surgeon: Norleen LULLA Server, MD;  Location: [facility];  Service: Gynecology;  Laterality: N/A;   CHOLECYSTECTOMY     DILATION AND CURETTAGE OF UTERUS     Family History  Problem Relation Age of Onset   Hypertension Mother    Social History   Socioeconomic History   Marital status: Single    Spouse name: Not on file   Number of children: Not on file   Years of education: Not on file   Highest education level: Not on file  Occupational History   Not on file  Tobacco Use   Smoking status: Every Day    Current packs/day: 0.00    Average packs/day: 1 pack/day for 1 year (1.0 ttl pk-yrs)    Types: Cigarettes    Start date: 09/20/2013  Last attempt to quit: 09/21/2014    Years since quitting: 9.3   Smokeless tobacco: Never  Vaping Use   Vaping status: Never Used  Substance and Sexual Activity   Alcohol use: No   Drug use: Yes    Frequency: 1.0 times per week    Types: Marijuana    Comment: once a day   Sexual activity: Yes    Birth control/protection: Pill  Other Topics Concern   Not on file  Social History Narrative   Not on file   Social Drivers of Health   Financial Resource Strain: Not on file  Food Insecurity: Not on file  Transportation Needs: Not on file  Physical Activity: Not on file  Stress: Not on file (03/06/2023)  Social Connections: Not on file   Allergies  Allergen Reactions   Clindamycin /Lincomycin Other (See Comments)    Elevated LFTs    Medications   Current Facility-Administered Medications:    [START ON 02/05/2024]  stroke: early stages of recovery book, , Does not apply, Once, Remi Pippin, NP   0.9 %  sodium chloride  infusion, , Intravenous, Continuous, Shafer, Devon, NP   acetaminophen  (TYLENOL ) tablet 650 mg, 650  mg, Oral, Q4H PRN **OR** acetaminophen  (TYLENOL ) 160 MG/5ML solution 650 mg, 650 mg, Per Tube, Q4H PRN **OR** acetaminophen  (TYLENOL ) suppository 650 mg, 650 mg, Rectal, Q4H PRN, Remi, Devon, NP   clevidipine (CLEVIPREX) infusion 0.5 mg/mL, 0-21 mg/hr, Intravenous, Continuous, Lester Golas, MD   pantoprazole  (PROTONIX ) injection 40 mg, 40 mg, Intravenous, QHS, Shafer, Devon, NP   senna-docusate (Senokot-S) tablet 1 tablet, 1 tablet, Oral, QHS PRN, Remi Pippin, NP   sodium chloride  0.9 % bolus 250 mL, 250 mL, Intravenous, PRN, Lester Golas, MD  Current Outpatient Medications:    acyclovir  (ZOVIRAX ) 400 MG tablet, Take 500 mg by mouth continuous as needed (outbreak)., Disp: , Rfl:   Facility-Administered Medications Ordered in Other Encounters:    dexamethasone (DECADRON) injection, , Intravenous, Anesthesia Intra-op, Mannie Boas A, CRNA, 10 mg at 02/04/24 1610   lactated ringers  infusion, , Intravenous, Continuous PRN, Mannie Boas LABOR, CRNA, New Bag at 02/04/24 1600   lidocaine  2% (20 mg/mL) 5 mL syringe, , Intravenous, Anesthesia Intra-op, Mannie Boas LABOR, CRNA, 40 mg at 02/04/24 1604   ondansetron  (ZOFRAN ) injection, , Intravenous, Anesthesia Intra-op, Mannie Boas LABOR, CRNA, 4 mg at 02/04/24 1610   phenylephrine  (NEO-SYNEPHRINE) 20mg /NS premix infusion, , Intravenous, Continuous PRN, Mannie Boas LABOR, CRNA, Stopped at 02/04/24 1634   PHENYLephrine  80 mcg/ml in normal saline Adult IV Push Syringe (For Blood Pressure Support), , Intravenous, Anesthesia Intra-op, Mannie Boas LABOR, CRNA, 120 mcg at 02/04/24 1618   propofol (DIPRIVAN) 10 mg/mL bolus/IV push, , Intravenous, Anesthesia Intra-op, Mannie Boas LABOR, CRNA, 130 mg at 02/04/24 1605   rocuronium (ZEMURON) injection, , Intravenous, Anesthesia Intra-op, Mannie Boas LABOR, CRNA, 50 mg at 02/04/24 1614   succinylcholine (ANECTINE) syringe, , Intravenous, Anesthesia Intra-op, Mannie Boas LABOR, CRNA, 200 mg at 02/04/24 1605   sugammadex sodium  (BRIDION) injection, , Intravenous, Anesthesia Intra-op, Mannie Boas LABOR, CRNA, 200 mg at 02/04/24 1632   Vitals   Vitals:   02/04/24 1510 02/04/24 1515 02/04/24 1526 02/04/24 1600  BP: 122/77 120/82 109/60 136/84  Pulse:   85   Resp: (!) 23 (!) 22 18   Temp:   97.6 F (36.4 C)   TempSrc:   Oral   SpO2:   98%   Weight:      Height:  Body mass index is 25.39 kg/m.  Physical Exam   Constitutional: Appears well-developed and well-nourished.  Psych: Calm  Eyes: No scleral injection.  HENT: No OP obstruction.  Head: Normocephalic.  Cardiovascular: Normal rate and regular rhythm.  Respiratory: Effort normal, non-labored breathing.  GI: Soft.  No distension. There is no tenderness.  Skin: WDI.   Neurologic Examination   Neuro: Mental Status: Patient is drowsy Opens eyes with noxious stimuli but no verbal output  Cranial Nerves: II:  left field cut  III,IV, VI: Right gaze VII: Facial droop VIII: Hearing is intact to voice X: Palate elevates symmetrically XI:  XII: Unable to assess prior to IR  Motor: Tone is normal. Bulk is normal. No movement noted in left upper or lower extremity Appears to have full strength on the right upper and lower extremity Sensory: Does not withdraw to pain on the left Cerebellar: No ataxia noted on the right   Labs   CBC:  Recent Labs  Lab 02/04/24 1441  WBC 10.9*  NEUTROABS 5.1  HGB 13.5  14.6  HCT 40.4  43.0  MCV 78.0*  PLT 486*   Basic Metabolic Panel:  Lab Results  Component Value Date   NA 137 02/04/2024   NA 141 02/04/2024   K 3.7 02/04/2024   K 2.9 (L) 02/04/2024   CO2 22 02/04/2024   GLUCOSE 118 (H) 02/04/2024   GLUCOSE 115 (H) 02/04/2024   BUN 4 (L) 02/04/2024   BUN 5 (L) 02/04/2024   CREATININE 0.60 02/04/2024   CREATININE 0.65 02/04/2024   CALCIUM 9.6 02/04/2024   GFRNONAA >60 02/04/2024   GFRAA 144 06/20/2020   Lipid Panel:  Lab Results  Component Value Date   LDLCALC 121 (H) 07/18/2013    HgbA1c: No results found for: HGBA1C Urine Drug Screen:     Component Value Date/Time   LABOPIA POSITIVE (A) 10/30/2018 0416   COCAINSCRNUR NONE DETECTED 10/30/2018 0416   COCAINSCRNUR NEG 04/12/2013 1203   LABBENZ NONE DETECTED 10/30/2018 0416   LABBENZ NEG 04/12/2013 1203   AMPHETMU NONE DETECTED 10/30/2018 0416   THCU POSITIVE (A) 10/30/2018 0416   LABBARB NONE DETECTED 10/30/2018 0416    Alcohol Level     Component Value Date/Time   ETH <15 02/04/2024 1441   INR  Lab Results  Component Value Date   INR 1.0 02/04/2024   APTT  Lab Results  Component Value Date   APTT <22 (L) 02/04/2024     CT Head without contrast(Personally reviewed): 1. No acute intracranial abnormality. ASPECTS 10/10   CT angio Head and Neck with contrast(Personally reviewed): 1. Proximal right M2 occlusion just distal to inferior temporal branches, impacting much of the right MCA distribution. 2. More distal posterolateral branches fill via collateral flow.  Assessment   Ruth Mullen is a 38 y.o. female hx of hypertension presenting with left-sided weakness.  She was last seen well by her son around 1230 but had talked to her mother at the phone around 1410 and was walking around at that time.  She presented to [facility] and was activated as a code stroke.  She was seen by [facility] telemedicine and was in the window for TNK.  TNK was given at 1504.  Her NIH was 16.  CTA showed a right distal MCA occlusion versus stenosis and she was activated as a code IR and was transferred to [facility]. Stroke team to follow tomorrow.   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   Secondary Diagnosis: Essential (primary) hypertension  Recommendations  - HgbA1c, fasting lipid panel - MRI of the brain without contrast at 24 hours from TNK  - BP goal per neuro IR team - less than 160  - Cleviprex  - PRN  hydralazine and labetalol ordered - Frequent neuro checks - Echocardiogram - Prophylactic therapy-Antiplatelet medications at 24 hours from TNK  - Risk factor modification - Telemetry monitoring - PT consult, OT consult, Speech consult  ______________________________________________________________________   Signed, Jorene Last, NP [facility]   Attending Neurohospitalist Addendum Patient seen and examined with APP/Resident. Agree with the history and physical as documented above. Agree with the plan as documented, which I helped formulate. I have edited the note above to reflect my full findings and recommendations. I have independently reviewed the chart, obtained history, review of systems and examined the patient.I have personally reviewed pertinent head/neck/spine imaging (CT/MRI). Please feel free to call with any questions.  -- Elida Ross, MD [facility] [phone]  If 7pm- 7am, please page neurology on call as listed in AMION.

## 2024-02-04 NOTE — Progress Notes (Signed)
 Dorena Devonshire (Mother) gives consent for patient to be transported via [facility] to [facility]

## 2024-02-04 NOTE — ED Provider Notes (Signed)
 Ruth Mullen is a 38 y.o. female.    Altered Mental Status Associated symptoms: no abdominal pain, no fever, no palpitations, no rash, no seizures and no vomiting      Presents due to weakness and altered mental status.  According to son, last seen normal about 2 hours ago.  Was seen about 1230.  He saw her later and thought she was acting abnormal.  Was not speaking properly.  Subsequent brought to the ED for further evaluation.   Patient currently denies headache.  No fever no chills.  Shakes her head no to anticoagulation use. Denies recent fever/chills. No trauma. No Birth control.     Previous medical history reviewed : Last seen in the ED due to left sided neck pain in June of 2024.    Prior to Admission medications   Medication Sig Start Date End Date Taking? Authorizing Provider  acyclovir  (ZOVIRAX ) 400 MG tablet Take 500 mg by mouth continuous as needed (outbreak).    [provider]  pantoprazole  (PROTONIX ) 40 MG tablet Take 1 tablet (40 mg total) by mouth daily. Patient not taking: No sig reported 11/01/18 06/20/20  Maree Bracken D, DO    Allergies: Clindamycin /lincomycin    Review of Systems  Constitutional:  Negative for chills and fever.  HENT:  Negative for ear pain and sore throat.   Eyes:  Negative for pain and visual disturbance.  Respiratory:  Negative for cough and shortness of breath.   Cardiovascular:  Negative for chest pain and palpitations.  Gastrointestinal:  Negative for abdominal pain and vomiting.  Genitourinary:  Negative for dysuria and hematuria.  Musculoskeletal:  Negative for arthralgias and back pain.  Skin:  Negative for color change and rash.  Neurological:  Negative for seizures and syncope.  All other systems reviewed and are  negative.   Updated Vital Signs BP 109/60 (BP Location: Right Arm)   Pulse 85   Temp 97.6 F (36.4 C) (Oral)   Resp 18   Ht 5' (1.524 m)   Wt 59 kg   SpO2 98%   BMI 25.39 kg/m   Physical Exam Vitals and nursing note reviewed.  Constitutional:      General: She is not in acute distress.    Appearance: She is well-developed.  HENT:     Head: Normocephalic and atraumatic.  Eyes:     Conjunctiva/sclera: Conjunctivae normal.  Cardiovascular:     Rate and Rhythm: Normal rate and regular rhythm.     Heart sounds: No murmur heard. Pulmonary:     Effort: Pulmonary effort is normal. No respiratory distress.     Breath sounds: Normal breath sounds.  Abdominal:     Palpations: Abdomen is soft.     Tenderness: There is no abdominal tenderness.  Musculoskeletal:        General: No swelling.     Cervical back: Neck supple.  Skin:    General: Skin is warm and dry.     Capillary Refill: Capillary refill takes less than 2 seconds.  Neurological:     Mental Status: She is alert. She is disoriented.     GCS: GCS eye subscore is 3. GCS verbal subscore is 4. GCS motor subscore is 6.     Comments: NIH Stroke Scale  ED from 02/04/2024 in [facility]  Emergency Department at [facility]  02/04/2024  1444 1A. Level of Consciousness 1 1B. Ask Month and Age 73 1C. Blink Eyes & Squeeze Hands 1 2. Best Gaze 2 3. Visual 0 4. Facial Palsy 3 5A. Motor - Left Arm 3 5B. Motor - Right Arm 0 6A. Motor - Left Leg 3 6B. Motor - Right Leg 0 7. Limb Ataxia 1 8. Sensory Loss 0 9. Best Language 1 10. Dysarthria 1 11. Extinction and Inattention 1 NIH Stroke Scale 17    Psychiatric:        Mood and Affect: Mood normal.     (all labs ordered are listed, but only abnormal results are displayed) Labs Reviewed  CBC - Abnormal; Notable for the following components:      Result Value   WBC 10.9 (*)    RBC 5.18 (*)    MCV 78.0 (*)    Platelets 486 (*)    All other components within  normal limits  DIFFERENTIAL - Abnormal; Notable for the following components:   Lymphs Abs 5.1 (*)    All other components within normal limits  COMPREHENSIVE METABOLIC PANEL WITH GFR - Abnormal; Notable for the following components:   Potassium 2.9 (*)    Glucose, Bld 115 (*)    BUN 5 (*)    Anion gap 17 (*)    All other components within normal limits  I-STAT CHEM 8, ED - Abnormal; Notable for the following components:   BUN 4 (*)    Glucose, Bld 118 (*)    Calcium, Ion 0.96 (*)    All other components within normal limits  PROTIME-INR  APTT  ETHANOL  URINE DRUG SCREEN  POC URINE PREG, ED    EKG: EKG Interpretation Date/Time:  Friday February 04 2024 14:37:27 EDT Ventricular Rate:  94 PR Interval:  114 QRS Duration:  87 QT Interval:  377 QTC Calculation: 472 R Axis:   84  Text Interpretation: Sinus rhythm Borderline short PR interval Consider left ventricular hypertrophy Borderline T abnormalities, inferior leads Baseline wander in lead(s) V1 Confirmed by Ruth Mullen [phone]) on 02/04/2024 2:59:21 PM  Radiology: CT ANGIO HEAD NECK W WO CM (CODE STROKE) Result Date: 02/04/2024 EXAM: CTA HEAD AND NECK WITH AND WITHOUT 02/04/2024 03:06:21 PM TECHNIQUE: CTA of the head and neck was performed with and without the administration of 100 mL of iohexol  (OMNIPAQUE ) 350 MG/ML injection. Multiplanar 2D and/or 3D reformatted images are provided for review. Automated exposure control, iterative reconstruction, and/or weight based adjustment of the mA/kV was utilized to reduce the radiation dose to as low as reasonably achievable. Stenosis of the internal carotid arteries measured using NASCET criteria. COMPARISON: CT head without contrast 02/04/2024 CLINICAL HISTORY: Neuro deficit, acute, stroke suspected. FINDINGS: CTA NECK: AORTIC ARCH AND ARCH VESSELS: No dissection or arterial injury. No significant stenosis of the brachiocephalic or subclavian arteries. CERVICAL CAROTID ARTERIES: No  dissection, arterial injury, or hemodynamically significant stenosis by NASCET criteria. CERVICAL VERTEBRAL ARTERIES: No dissection, arterial injury, or significant stenosis. LUNGS AND MEDIASTINUM: Unremarkable. SOFT TISSUES: No acute abnormality. BONES: No acute abnormality. CTA HEAD: ANTERIOR CIRCULATION: No significant stenosis of the internal carotid arteries. No significant stenosis of the anterior cerebral arteries. Proximal right M2 occlusion is present just distal to inferior temporal branches. More distal posterolateral branches are filled by pial collateral flow. The occlusion impacts much of the right MCA distribution. The left middle cerebral artery is patent proximally without significant stenosis. No aneurysm. POSTERIOR CIRCULATION: No significant stenosis  of the posterior cerebral arteries. No significant stenosis of the basilar artery. No significant stenosis of the vertebral arteries. No aneurysm. OTHER: No dural venous sinus thrombosis on this non-dedicated study. Findings were communicated to doctor Ruth Mullen by voice and text message at 3:05 PM. IMPRESSION: 1. Proximal right M2 occlusion just distal to inferior temporal branches, impacting much of the right MCA distribution. 2. More distal posterolateral branches fill via collateral flow. 3. Critical results communicated to Dr. Aisha Mullen at 3:05 PM. Electronically signed by: Ruth Necessary MD 02/04/2024 03:19 PM EDT RP Workstation: [id number]   CT HEAD CODE STROKE WO CONTRAST (LKW 0-4.5h, LVO 0-24h) Result Date: 02/04/2024 EXAM: CT HEAD WITHOUT CONTRAST 02/04/2024 02:46:19 PM TECHNIQUE: CT of the head was performed without the administration of intravenous contrast. Automated exposure control, iterative reconstruction, and/or weight based adjustment of the mA/kV was utilized to reduce the radiation dose to as low as reasonably achievable. COMPARISON: None available. CLINICAL HISTORY: Neuro deficit, acute, stroke  suspected. Left sided weakness. FINDINGS: BRAIN AND VENTRICLES: No acute hemorrhage. No evidence of acute infarct. No hydrocephalus. No extra-axial collection. No mass effect or midline shift. [place] stroke program early CT (ASPECT) score: Ganglionic (caudate, IC, lentiform nucleus, insula, M1-M3): 7 Supraganglionic (M4-M6): 3 Total: 10 ORBITS: No acute abnormality. SINUSES: No acute abnormality. SOFT TISSUES AND SKULL: No acute soft tissue abnormality. No skull fracture. Kirkpatrick 252 pm IMPRESSION: 1. No acute intracranial abnormality.  ASPECTS 10/10 The pertinent results were texted to Dr. Seals via the [facility] system at 2:54 pm. Electronically signed by: Ruth Necessary MD 02/04/2024 02:55 PM EDT RP Workstation: [id number]     Procedures   Medications Ordered in the ED  iohexol  (OMNIPAQUE ) 350 MG/ML injection 100 mL (100 mLs Intravenous Contrast Given 02/04/24 1452)  tenecteplase (TNKASE) injection for Stroke 15 mg (15 mg Intravenous Given 02/04/24 1504)                      NIH Stroke Scale: 17              Medical Decision Making Amount and/or Complexity of Data Reviewed Labs: ordered. Radiology: ordered.  Risk Prescription drug management.     HPI:  Presents due to weakness and altered mental status.  According to son, last seen normal about 2 hours ago.  Was seen about 1230.  He saw her later and thought she was acting abnormal.  Was not speaking properly.  Subsequent brought to the ED for further evaluation.   Patient currently denies headache.  No fever no chills.  Shakes her head no to anticoagulation use. Denies recent fever/chills. No trauma. No Birth control. No seizure history.     Previous medical history reviewed : Last seen in the ED due to left sided neck pain in June of 2024.   MDM:   I was asked to come emergently to patient's room upon arrival to the ED.  Upon walking in the room, patient was clearly altered.  Has some sputum around her mouth.   Whenever she started speaking, noticed that the left side of her face was not really moving.  Also was not crossing midline with her eyes to the left was only looking straight ahead and to the right.  GCS of approximately 13 given patient was mostly closing her eyes but would open upon command.  Patient slightly confused as well.  Ultimately found to be down in the left side without any, effort against gravity of both the left arm  and left leg.  Sensory exam seen to be normal.  Slightly confused this will.  Maybe some minimal dysarthria and aphasia but nothing significant.  Has some extension and inattention on the left side.  NIH score of 17.  Patient was stroke alerted. Glucose greater than 100.   Stat CT head obtained. Dr. Michaela was able to join via tele.  He ultimately calculated NIH score of approximately 16.  Patient was taken straight to the CT scanner.  CT scan showed no evidence of any kind of obvious bleed.  Therefore, I spoke to the patient's son as well as Dr. Michaela spoke to the patient's son and patient was consented for TNK due to high suspicion for large vessel occlusion.  Patient subsequent received TNK and then obtain CTA with perfusion.  Small right M2 occlusion.  Consistent with patient's examination.  Therefore, it is determined that patient would benefit from thrombectomy.  Patient accepted to [facility] under Dr. Michaela straight to the IR lab for thrombectomy.  Patient's mother and grandmother at bedside and stated that they understood.  Patient is currently protecting airway.  No indication for emergent intubation at this point in time.  GCS remains approximate 13.    EMS was called for emergent transfer in expedited manner.   Interventions: TNK  EKG Interpreted by Me: Sinus   Cardiac Tele Interpreted by Me: Sinus   I have independently interpreted the  CT  images and agree with the radiologist finding   Social Determinant of Health:    Disposition and  Follow Up: Transfer.   CRITICAL CARE Performed by: Lavonia LOISE Pat   Total critical care time: 45 minutes  Critical care time was exclusive of separately billable procedures and treating other patients.  Critical care was Mullen to treat or prevent imminent or life-threatening deterioration.  Critical care was time spent personally by me on the following activities: development of treatment plan with patient and/or surrogate as well as nursing, discussions with consultants, evaluation of patient's response to treatment, examination of patient, obtaining history from patient or surrogate, ordering and performing treatments and interventions, ordering and review of laboratory studies, ordering and review of radiographic studies, pulse oximetry and re-evaluation of patient's condition.       Final diagnoses:  Acute CVA (cerebrovascular accident) (HCC)  Altered mental status, unspecified altered mental status type    ED Discharge Orders     None          Pat Lavonia LOISE, MD 02/04/24 1537

## 2024-02-04 NOTE — Brief Op Note (Signed)
  NEUROSURGERY BRIEF THROMBECTOMY NOTE   PREOP DX: Right M1 embolus  POSTOP DX: Same  PROCEDURE: thrombectomy  SURGEON: Nancyann LULLA Burns   ANESTHESIA: GETA  EBL: Minimal  Number of Passes: 1  Technique: ASPIRATION  Final TICI score: 3  Post OP blood pressure goal: SBP<160  Arterial Angioplasty or Stent: No   Anti-Platelet Therapy: No   COMPLICATIONS: No   CONDITION: Stable to recovery  FINDINGS (Full report in [facility]): 1. Right M1 embolus   Nancyann LULLA Burns  @today @ 4:31 PM

## 2024-02-04 NOTE — Transfer of Care (Signed)
 Immediate Anesthesia Transfer of Care Note  Patient: Ruth Mullen  Procedure(s) Performed: IR PERCUTANEOUS ART THROMBECTOMY/INFUSION INTRACRANIAL INC DIAG ANGIO IR US  GUIDE VASC ACCESS RIGHT RADIOLOGY WITH ANESTHESIA  Patient Location: PACU  Anesthesia Type:General  Level of Consciousness: drowsy  Airway & Oxygen Therapy: Patient Spontanous Breathing  Post-op Assessment: Report given to RN and Post -op Vital signs reviewed and stable  Post vital signs: Reviewed and stable  Last Vitals:  Vitals Value Taken Time  BP 131/88 02/04/24 17:03  Temp    Pulse 89 02/04/24 17:08  Resp 20 02/04/24 17:08  SpO2 94 % 02/04/24 17:08  Vitals shown include unfiled device data.  Last Pain:  Vitals:   02/04/24 1600  TempSrc:   PainSc: 0-No pain         Complications: No notable events documented.

## 2024-02-05 ENCOUNTER — Inpatient Hospital Stay (HOSPITAL_COMMUNITY)

## 2024-02-05 DIAGNOSIS — F17210 Nicotine dependence, cigarettes, uncomplicated: Secondary | ICD-10-CM | POA: Diagnosis not present

## 2024-02-05 DIAGNOSIS — F1210 Cannabis abuse, uncomplicated: Secondary | ICD-10-CM | POA: Diagnosis not present

## 2024-02-05 DIAGNOSIS — E785 Hyperlipidemia, unspecified: Secondary | ICD-10-CM | POA: Diagnosis not present

## 2024-02-05 DIAGNOSIS — I63411 Cerebral infarction due to embolism of right middle cerebral artery: Secondary | ICD-10-CM

## 2024-02-05 LAB — RAPID URINE DRUG SCREEN, HOSP PERFORMED
Amphetamines: NOT DETECTED
Barbiturates: NOT DETECTED
Benzodiazepines: NOT DETECTED
Cocaine: NOT DETECTED
Opiates: NOT DETECTED
Tetrahydrocannabinol: POSITIVE — AB

## 2024-02-05 LAB — CBC
HCT: 35.7 % — ABNORMAL LOW (ref 36.0–46.0)
Hemoglobin: 12.1 g/dL (ref 12.0–15.0)
MCH: 26 pg (ref 26.0–34.0)
MCHC: 33.9 g/dL (ref 30.0–36.0)
MCV: 76.8 fL — ABNORMAL LOW (ref 80.0–100.0)
Platelets: 413 K/uL — ABNORMAL HIGH (ref 150–400)
RBC: 4.65 MIL/uL (ref 3.87–5.11)
RDW: 15.2 % (ref 11.5–15.5)
WBC: 14.2 K/uL — ABNORMAL HIGH (ref 4.0–10.5)
nRBC: 0 % (ref 0.0–0.2)

## 2024-02-05 LAB — LIPID PANEL
Cholesterol: 182 mg/dL (ref 0–200)
HDL: 50 mg/dL (ref >40)
LDL Cholesterol: 124 mg/dL — ABNORMAL HIGH (ref 0–99)
Total CHOL/HDL Ratio: 3.6 ratio
Triglycerides: 42 mg/dL (ref <150)
VLDL: 8 mg/dL (ref 0–40)

## 2024-02-05 LAB — BASIC METABOLIC PANEL WITH GFR
Anion gap: 14 (ref 5–15)
BUN: <5 mg/dL — ABNORMAL LOW (ref 6–20)
CO2: 20 mmol/L — ABNORMAL LOW (ref 22–32)
Calcium: 8.4 mg/dL — ABNORMAL LOW (ref 8.9–10.3)
Chloride: 103 mmol/L (ref 98–111)
Creatinine, Ser: 0.62 mg/dL (ref 0.44–1.00)
GFR, Estimated: >60 mL/min (ref >60)
Glucose, Bld: 97 mg/dL (ref 70–99)
Potassium: 3.3 mmol/L — ABNORMAL LOW (ref 3.5–5.1)
Sodium: 137 mmol/L (ref 135–145)

## 2024-02-05 LAB — IRON AND TIBC
Iron: 29 ug/dL (ref 28–170)
Saturation Ratios: 7 % — ABNORMAL LOW (ref 10.4–31.8)
TIBC: 426 ug/dL (ref 250–450)
UIBC: 397 ug/dL

## 2024-02-05 LAB — ANTITHROMBIN III: AntiThromb III Func: 120 % (ref 75–120)

## 2024-02-05 LAB — HIV ANTIBODY (ROUTINE TESTING W REFLEX): HIV Screen 4th Generation wRfx: NONREACTIVE

## 2024-02-05 LAB — PREGNANCY, URINE: Preg Test, Ur: NEGATIVE

## 2024-02-05 LAB — FERRITIN: Ferritin: 7 ng/mL — ABNORMAL LOW (ref 11–307)

## 2024-02-05 LAB — HEMOGLOBIN A1C
Hgb A1c MFr Bld: 5.2 % (ref 4.8–5.6)
Mean Plasma Glucose: 102.54 mg/dL

## 2024-02-05 MED ORDER — PHENOL 1.4 % MT LIQD
1.0000 | OROMUCOSAL | Status: DC | PRN
  Administered 2024-02-05: 1 via OROMUCOSAL
  Filled 2024-02-05: qty 177

## 2024-02-05 MED ORDER — ASPIRIN 81 MG PO TBEC
81.0000 mg | DELAYED_RELEASE_TABLET | Freq: Every day | ORAL | Status: DC
  Administered 2024-02-06 – 2024-02-08 (×3): 81 mg via ORAL
  Filled 2024-02-05 (×3): qty 1

## 2024-02-05 MED ORDER — ATORVASTATIN CALCIUM 40 MG PO TABS
40.0000 mg | ORAL_TABLET | Freq: Every day | ORAL | Status: DC
  Administered 2024-02-06 – 2024-02-08 (×3): 40 mg via ORAL
  Filled 2024-02-05 (×3): qty 1

## 2024-02-05 MED ORDER — NICOTINE 14 MG/24HR TD PT24
14.0000 mg | MEDICATED_PATCH | Freq: Every day | TRANSDERMAL | Status: DC
Start: 2024-02-05 — End: 2024-02-08
  Administered 2024-02-05 – 2024-02-08 (×4): 14 mg via TRANSDERMAL
  Filled 2024-02-05 (×3): qty 1

## 2024-02-05 MED ORDER — ENOXAPARIN SODIUM 40 MG/0.4ML IJ SOSY
40.0000 mg | PREFILLED_SYRINGE | Freq: Every day | INTRAMUSCULAR | Status: DC
  Administered 2024-02-05 – 2024-02-07 (×3): 40 mg via SUBCUTANEOUS
  Filled 2024-02-05 (×3): qty 0.4

## 2024-02-05 MED ORDER — CLOPIDOGREL BISULFATE 75 MG PO TABS
75.0000 mg | ORAL_TABLET | Freq: Every day | ORAL | Status: DC
  Administered 2024-02-06 – 2024-02-08 (×3): 75 mg via ORAL
  Filled 2024-02-05 (×3): qty 1

## 2024-02-05 NOTE — Progress Notes (Signed)
 PT Cancellation Note  Patient Details Name: Ruth Mullen MRN: [id number] DOB: 11-27-1985   Cancelled Treatment:    Reason Eval/Treat Not Completed: Active bedrest order  Noted bedrest order from 1010 at 1601. If bedrest orders lifted, please reach out via securechat and PT will proceed with evaluation. Thank you   Ruth Mullen, PT Acute Rehabilitation Services  Office [phone]   Ruth Mullen 02/05/2024, 7:34 AM

## 2024-02-05 NOTE — Progress Notes (Signed)
 Spoke to Dr. Jerri Pt is cleared to travel via transport w/o RN assistance. CT notified and they will send transport when they are ready for Pt.

## 2024-02-05 NOTE — Evaluation (Addendum)
 Physical Therapy Evaluation Patient Details Name: Ruth Mullen MRN: [id number] DOB: 1985/07/01 Today's Date: 02/05/2024  History of Present Illness  38 year old female with a history of hypertension who presents with left-sided weakness. CTA was reviewed and she was found to have a proximal M2 occlusion. +Tnk and thrombectomy;  Clinical Impression  Pt admitted secondary to problem above with deficits below. PTA patient completely independent, living with 2 kids, driving, and working 2 jobs. Pt currently was limited by dizziness (BP stable) and was able to walk 35 ft with CGA and no device.  Anticipate patient will benefit from PT to address problems listed below. Will continue to follow acutely to maximize functional mobility, independence, and safety.  Anticipate no PT needs upon dischargge.         If plan is discharge home, recommend the following: Help with stairs or ramp for entrance   Can travel by private vehicle        Equipment Recommendations None recommended by PT  Recommendations for Other Services       Functional Status Assessment Patient has had a recent decline in their functional status and demonstrates the ability to make significant improvements in function in a reasonable and predictable amount of time.     Precautions / Restrictions Precautions Precautions: None      Mobility  Bed Mobility Overal bed mobility: Independent                  Transfers Overall transfer level: Needs assistance Equipment used: None Transfers: Sit to/from Stand Sit to Stand: Contact guard assist           General transfer comment: slightly dizzy; resolved in <15 seconds    Ambulation/Gait Ambulation/Gait assistance: Contact guard assist Gait Distance (Feet): 35 Feet Assistive device: None Gait Pattern/deviations: WFL(Within Functional Limits)       General Gait Details: limited by return of dizziness; returned to chair with quick resolution; BPs  stabel  Stairs            Wheelchair Mobility     Tilt Bed    Modified Rankin (Stroke Patients Only) Modified Rankin (Stroke Patients Only) Pre-Morbid Rankin Score: No symptoms Modified Rankin: No significant disability     Balance Overall balance assessment: No apparent balance deficits (not formally assessed)                                           Pertinent Vitals/Pain Pain Assessment Pain Assessment: No/denies pain    Home Living Family/patient expects to be discharged to:: Private residence Living Arrangements: Children (2 kids) Available Help at Discharge: Family;Available 24 hours/day Type of Home: Mobile home Home Access: Stairs to enter;Ramped entrance Entrance Stairs-Rails: Right;Left;Can reach both Entrance Stairs-Number of Steps: 3   Home Layout: One level Home Equipment: None Additional Comments: information is for her mother's home where she plans to stay on discharge    Prior Function Prior Level of Function : Independent/Modified Independent;Driving;Working/employed (2 jobs; Associate Professor, [employer] from home)                     Extremity/Trunk Assessment   Upper Extremity Assessment Upper Extremity Assessment: Defer to OT evaluation    Lower Extremity Assessment Lower Extremity Assessment: Overall WFL for tasks assessed    Cervical / Trunk Assessment Cervical / Trunk Assessment: Normal  Communication   Communication Communication: No  apparent difficulties    Cognition Arousal: Alert Behavior During Therapy: WFL for tasks assessed/performed   PT - Cognitive impairments: No apparent impairments                         Following commands: Intact       Cueing Cueing Techniques: Verbal cues     General Comments General comments (skin integrity, edema, etc.): BP supine 136/84 HR 95; after walking 144/85 HR 99    Exercises     Assessment/Plan    PT Assessment Patient needs continued PT  services  PT Problem List Decreased activity tolerance;Decreased mobility       PT Treatment Interventions Gait training;Stair training;Functional mobility training;Therapeutic activities;Therapeutic exercise;Patient/family education    PT Goals (Current goals can be found in the Care Plan section)  Acute Rehab PT Goals Patient Stated Goal: be able to go home tomorrow PT Goal Formulation: With patient Time For Goal Achievement: 02/19/24 Potential to Achieve Goals: Good    Frequency Min 3X/week     Co-evaluation               AM-PAC PT 6 Clicks Mobility  Outcome Measure Help needed turning from your back to your side while in a flat bed without using bedrails?: None Help needed moving from lying on your back to sitting on the side of a flat bed without using bedrails?: None Help needed moving to and from a bed to a chair (including a wheelchair)?: A Little Help needed standing up from a chair using your arms (e.g., wheelchair or bedside chair)?: A Little Help needed to walk in hospital room?: A Little Help needed climbing 3-5 steps with a railing? : A Little 6 Click Score: 20    End of Session Equipment Utilized During Treatment: Gait belt Activity Tolerance: Treatment limited secondary to medical complications (Comment) Patient left: in chair;with call bell/phone within reach Nurse Communication: Mobility status;Other (comment) (mild dizziness with quick resolution on return to sitting) PT Visit Diagnosis: Difficulty in walking, not elsewhere classified (R26.2)    Time: 8469-8452 PT Time Calculation (min) (ACUTE ONLY): 17 min   Charges:   PT Evaluation $PT Eval Low Complexity: 1 Low   PT General Charges $$ ACUTE PT VISIT: 1 Visit          Macario RAMAN, PT Acute Rehabilitation Services  Office [phone]   Macario SHAUNNA Soja 02/05/2024, 3:56 PM

## 2024-02-05 NOTE — Progress Notes (Signed)
 STROKE TEAM PROGRESS NOTE   SUBJECTIVE (INTERVAL HISTORY) Her mom and grandparents are at the bedside.  Overall her condition is rapidly improving. Her neuro exam is intact on round. MRI showed R BG and caudate head infarcts with scattered small R MCA infarcts. R M2 now patent.    OBJECTIVE Temp:  [98.1 F (36.7 C)-99.4 F (37.4 C)] 98.1 F (36.7 C) (10/11 1950) Pulse Rate:  [67-103] 86 (10/11 2100) Cardiac Rhythm: Normal sinus rhythm (10/11 2000) Resp:  [12-24] 15 (10/11 2100) BP: (112-147)/(75-98) 127/88 (10/11 2100) SpO2:  [96 %-100 %] 97 % (10/11 2100)  No results for input(s): GLUCAP in the last 168 hours. Recent Labs  Lab 02/04/24 1441 02/05/24 0614  NA 141  137 137  K 2.9*  3.7 3.3*  CL 102  106 103  CO2 22 20*  GLUCOSE 115*  118* 97  BUN 5*  4* <5*  CREATININE 0.65  0.60 0.62  CALCIUM 9.6 8.4*   Recent Labs  Lab 02/04/24 1441  AST 28  ALT 17  ALKPHOS 74  BILITOT 0.3  PROT 7.9  ALBUMIN 4.5   Recent Labs  Lab 02/04/24 1441 02/05/24 0614  WBC 10.9* 14.2*  NEUTROABS 5.1  --   HGB 13.5  14.6 12.1  HCT 40.4  43.0 35.7*  MCV 78.0* 76.8*  PLT 486* 413*   No results for input(s): CKTOTAL, CKMB, CKMBINDEX, TROPONINI in the last 168 hours. Recent Labs    02/04/24 1441  LABPROT 13.8  INR 1.0   No results for input(s): COLORURINE, LABSPEC, PHURINE, GLUCOSEU, HGBUR, BILIRUBINUR, KETONESUR, PROTEINUR, UROBILINOGEN, NITRITE, LEUKOCYTESUR in the last 72 hours.  Invalid input(s): APPERANCEUR     Component Value Date/Time   CHOL 182 02/05/2024 0614   TRIG 42 02/05/2024 0614   HDL 50 02/05/2024 0614   CHOLHDL 3.6 02/05/2024 0614   VLDL 8 02/05/2024 0614   LDLCALC 124 (H) 02/05/2024 0614   Lab Results  Component Value Date   HGBA1C 5.2 02/05/2024      Component Value Date/Time   LABOPIA NONE DETECTED 02/04/2024 2325   COCAINSCRNUR NONE DETECTED 02/04/2024 2325   COCAINSCRNUR NEG 04/12/2013 1203   LABBENZ NONE  DETECTED 02/04/2024 2325   LABBENZ NEG 04/12/2013 1203   AMPHETMU NONE DETECTED 02/04/2024 2325   THCU POSITIVE (A) 02/04/2024 2325   LABBARB NONE DETECTED 02/04/2024 2325    Recent Labs  Lab 02/04/24 1441  ETH <15    I have personally reviewed the radiological images below and agree with the radiology interpretations.  CT HEAD WO CONTRAST ( ) Result Date: 02/05/2024 EXAM: CT HEAD WITHOUT CONTRAST 02/05/2024 04:41:52 PM TECHNIQUE: CT of the head was performed without the administration of intravenous contrast. Automated exposure control, iterative reconstruction, and/or weight based adjustment of the mA/kV was utilized to reduce the radiation dose to as low as reasonably achievable. COMPARISON: MRI head without contrast 02/05/2024 at 3:31 am. CLINICAL HISTORY: Stroke, follow up. Altered Mental Status. FINDINGS: BRAIN AND VENTRICLES: Acute/subacute nonhemorrhagic infarcts involving the right lentiform nucleus and superior caudate head are again noted. No acute hemorrhage is present. The small cortical infarct does not discretely visualized by CT. No new ischemic infarcts are present. No hydrocephalus. No extra-axial collection. No mass effect or midline shift. ORBITS: No acute abnormality. SINUSES: No acute abnormality. SOFT TISSUES AND SKULL: No acute soft tissue abnormality. No skull fracture. IMPRESSION: 1. Acute/subacute nonhemorrhagic infarcts involving the right lentiform nucleus and superior caudate head, as seen on prior MRI; no new ischemic infarcts.  2. No acute intracranial hemorrhage. Electronically signed by: Lonni Necessary MD 02/05/2024 05:04 PM EDT RP Workstation: [id number]   MR ANGIO HEAD WO CONTRAST Result Date: 02/05/2024 EXAM: MR Angiography Head without intravenous Contrast. 02/05/2024 04:08:56 AM TECHNIQUE: Magnetic resonance angiography images of the head without intravenous contrast. Multiplanar 2D and 3D reformatted images are provided for review. COMPARISON: CTA  head and neck 02/04/2024. CLINICAL HISTORY: 38 year old female with stroke, follow up. FINDINGS: ANTERIOR CIRCULATION: Antegrade flow in both ICA siphons appears symmetric. Patent carotid terminus. No significant stenosis of the internal carotid arteries. Patent ACA origins. Normal anterior communicating artery. No significant stenosis of the anterior cerebral arteries. Patent MCA origins. Right MCA trifurcation now appears patent and improved right MCA branch flow signal/appearance compared to the CTA 02/04/2024 (series 1024 image 30). No residual stenosis or occlusion identified. No significant stenosis of the left middle cerebral artery. No aneurysm. POSTERIOR CIRCULATION: Antegrade flow in the posterior circulation with dominant distal right vertebral artery. No significant stenosis of the vertebral arteries. No significant stenosis of the basilar artery. No significant stenosis of the posterior cerebral arteries. No aneurysm. IMPRESSION: 1. Revascularized right MCA trifurcation and substantially improved right MCA branch appearance. No residual stenosis or occlusion identified. 2. MRI reported separately. Electronically signed by: Helayne Hurst MD 02/05/2024 04:24 AM EDT RP Workstation: [id number]   MR BRAIN WO CONTRAST Result Date: 02/05/2024 EXAM: MR Brain without Intravenous Contrast. CLINICAL HISTORY: 39 year old female code stroke presentation yesterday. Right MCA M2 branch occlusion status post endovascular revascularization. TECHNIQUE: Magnetic resonance images of the brain without intravenous contrast in multiple planes. CONTRAST: Without. COMPARISON: Head CT, CTA yesterday. Intracranial MRI today is reported separately. FINDINGS: BRAIN: Restricted diffusion is present in the right basal ganglia and minimal cortical restricted diffusion is superimposed at the anterior right frontal operculum (series 5 image 84). T2 and FLAIR hyperintense cytotoxic edema is noted. Trace abnormal cortical diffusion is  also present at the right insula and in the posterior right temporal lobe (series 5 images 72 and 74). No left hemisphere or posterior fossa diffusion restriction. No acute hemorrhagic transformation. SWI suggests possible chronic microhemorrhage in the central pons (series 12 image 18). No other chronic cerebral blood products. A small focus of chronic cortical encephalomalacia is present in the inferior right parietal lobe on series 11 image 29. Scattered moderate for age cerebral white matter T2 and FLAIR hyperintensity is seen in both hemispheres. No intracranial mass effect. No midline shift or extra-axial fluid collection. No cerebellar tonsillar ectopia. The central arterial and venous flow voids are patent. VENTRICLES: No hydrocephalus. ORBITS: The orbits are normal. SINUSES AND MASTOIDS: The sinuses and mastoid air cells are clear. BONES: No acute fracture or focal osseous lesion. IMPRESSION: 1. Acute right basal ganglia infarct with subtle additional scattered cortical infarcts in the right MCA territory. Cytotoxic edema, but no hemorrhagic transformation or mass effect. 2. Possible chronic microhemorrhage in the central pons. Subtle chronic cortical encephalomalacia suspected in the right parietal lobe. And moderate for age chronic white matter changes otherwise. Electronically signed by: Helayne Hurst MD 02/05/2024 04:19 AM EDT RP Workstation: [id number]   CT HEAD WO CONTRAST ( ) Result Date: 02/04/2024 EXAM: CT HEAD WITHOUT CONTRAST 02/04/2024 10:06:26 PM TECHNIQUE: CT of the head was performed without the administration of intravenous contrast. Automated exposure control, iterative reconstruction, and/or weight based adjustment of the mA/kV was utilized to reduce the radiation dose to as low as reasonably achievable. COMPARISON: None available. CLINICAL HISTORY: Neuro deficit, acute, stroke  suspected. FINDINGS: BRAIN AND VENTRICLES: No acute hemorrhage. No evidence of acute infarct. No  hydrocephalus. No extra-axial collection. No mass effect or midline shift. ORBITS: No acute abnormality. SINUSES: No acute abnormality. SOFT TISSUES AND SKULL: No acute soft tissue abnormality. No skull fracture. IMPRESSION: 1. No evidence of acute intracranial abnormality. Electronically signed by: Gilmore Molt MD 02/04/2024 10:36 PM EDT RP Workstation: [id number]   CT ANGIO HEAD NECK W WO CM (CODE STROKE) Result Date: 02/04/2024 EXAM: CTA HEAD AND NECK WITH AND WITHOUT 02/04/2024 03:06:21 PM TECHNIQUE: CTA of the head and neck was performed with and without the administration of 100 mL of iohexol  (OMNIPAQUE ) 350 MG/ML injection. Multiplanar 2D and/or 3D reformatted images are provided for review. Automated exposure control, iterative reconstruction, and/or weight based adjustment of the mA/kV was utilized to reduce the radiation dose to as low as reasonably achievable. Stenosis of the internal carotid arteries measured using NASCET criteria. COMPARISON: CT head without contrast 02/04/2024 CLINICAL HISTORY: Neuro deficit, acute, stroke suspected. FINDINGS: CTA NECK: AORTIC ARCH AND ARCH VESSELS: No dissection or arterial injury. No significant stenosis of the brachiocephalic or subclavian arteries. CERVICAL CAROTID ARTERIES: No dissection, arterial injury, or hemodynamically significant stenosis by NASCET criteria. CERVICAL VERTEBRAL ARTERIES: No dissection, arterial injury, or significant stenosis. LUNGS AND MEDIASTINUM: Unremarkable. SOFT TISSUES: No acute abnormality. BONES: No acute abnormality. CTA HEAD: ANTERIOR CIRCULATION: No significant stenosis of the internal carotid arteries. No significant stenosis of the anterior cerebral arteries. Proximal right M2 occlusion is present just distal to inferior temporal branches. More distal posterolateral branches are filled by pial collateral flow. The occlusion impacts much of the right MCA distribution. The left middle cerebral artery is patent proximally  without significant stenosis. No aneurysm. POSTERIOR CIRCULATION: No significant stenosis of the posterior cerebral arteries. No significant stenosis of the basilar artery. No significant stenosis of the vertebral arteries. No aneurysm. OTHER: No dural venous sinus thrombosis on this non-dedicated study. Findings were communicated to doctor Aisha Seals by voice and text message at 3:05 PM. IMPRESSION: 1. Proximal right M2 occlusion just distal to inferior temporal branches, impacting much of the right MCA distribution. 2. More distal posterolateral branches fill via collateral flow. 3. Critical results communicated to Dr. Aisha Seals at 3:05 PM. Electronically signed by: Lonni Necessary MD 02/04/2024 03:19 PM EDT RP Workstation: [id number]   CT HEAD CODE STROKE WO CONTRAST (LKW 0-4.5h, LVO 0-24h) Result Date: 02/04/2024 EXAM: CT HEAD WITHOUT CONTRAST 02/04/2024 02:46:19 PM TECHNIQUE: CT of the head was performed without the administration of intravenous contrast. Automated exposure control, iterative reconstruction, and/or weight based adjustment of the mA/kV was utilized to reduce the radiation dose to as low as reasonably achievable. COMPARISON: None available. CLINICAL HISTORY: Neuro deficit, acute, stroke suspected. Left sided weakness. FINDINGS: BRAIN AND VENTRICLES: No acute hemorrhage. No evidence of acute infarct. No hydrocephalus. No extra-axial collection. No mass effect or midline shift. [place] stroke program early CT (ASPECT) score: Ganglionic (caudate, IC, lentiform nucleus, insula, M1-M3): 7 Supraganglionic (M4-M6): 3 Total: 10 ORBITS: No acute abnormality. SINUSES: No acute abnormality. SOFT TISSUES AND SKULL: No acute soft tissue abnormality. No skull fracture. Kirkpatrick 252 pm IMPRESSION: 1. No acute intracranial abnormality.  ASPECTS 10/10 The pertinent results were texted to Dr. Seals via the [facility] system at 2:54 pm. Electronically signed by: Lonni Necessary MD  02/04/2024 02:55 PM EDT RP Workstation: [id number]     PHYSICAL EXAM  Temp:  [98.1 F (36.7 C)-99.4 F (37.4 C)] 98.1 F (36.7 C) (10/11 1950) Pulse  Rate:  [67-103] 86 (10/11 2100) Resp:  [12-24] 15 (10/11 2100) BP: (112-147)/(75-98) 127/88 (10/11 2100) SpO2:  [96 %-100 %] 97 % (10/11 2100)  General - Well nourished, well developed, in no apparent distress.  Ophthalmologic - fundi not visualized due to noncooperation.  Cardiovascular - Regular rhythm and rate.  Mental Status -  Level of arousal and orientation to time, place, and person were intact. Language including expression, naming, repetition, comprehension was assessed and found intact. Fund of Knowledge was assessed and was intact.  Cranial Nerves II - XII - II - Visual field intact OU. III, IV, VI - Extraocular movements intact. V - Facial sensation intact bilaterally. VII - Facial movement intact bilaterally. VIII - Hearing & vestibular intact bilaterally. X - Palate elevates symmetrically. XI - Chin turning & shoulder shrug intact bilaterally XII - Tongue protrusion intact.  Motor Strength - The patient's strength was normal in all extremities and pronator drift was absent.  Bulk was normal and fasciculations were absent.   Motor Tone - Muscle tone was assessed at the neck and appendages and was normal.  Reflexes - The patient's reflexes were symmetrical in all extremities and she had no pathological reflexes.  Sensory - Light touch, temperature/pinprick were assessed and were symmetrical.    Coordination - The patient had normal movements in the hands and feet with no ataxia or dysmetria.  Tremor was absent.  Gait and Station - deferred.   ASSESSMENT/PLAN Ms. MARYROSE COLVIN is a 38 y.o. female with history of hypertension and heavy smoker admitted for left-sided weakness, headache. TNK given.    Stroke:  right MCA infarcts, embolic pattern secondary to unclear source, risk factor including heavy smoker  and THC use CT no acute abnormality CT head and neck right M2 occlusion Status post IR with right M1 occlusion TICI3 reperfusion CT status post IR no hemorrhage MRI   Acute right basal ganglia infarct with subtle additional scattered cortical infarcts in the right MCA territory. MRA  Revascularized right MCA trifurcation and substantially improved right MCA branch appearance. 2D Echo pending Will consider TEE if 2D echo unrevealing TTE bubble study pending LE venous Doppler pending Hypercoagulable workup pending LDL 124 HgbA1c 5.2 UDS positive for THC Lovenox  for VTE prophylaxis No antithrombotic prior to admission, now on aspirin 81 mg daily and clopidogrel 75 mg daily DAPT for 3 weeks and then ASA alone Patient counseled to be compliant with her antithrombotic medications Ongoing aggressive stroke risk factor management Therapy recommendations:  none Disposition:  pending  Hypertension Stable Home BP monitoring Long term BP goal normotensive  Hyperlipidemia Home meds: None LDL 124, goal < 70 Now on Lipitor 40 Continue statin at discharge Educated on avoid pregnancy while on statin  Tobacco abuse Current heavy smoker Smoking cessation counseling provided Nicotine patch provided Pt is willing to quit  Other Stroke Risk Factors   Other Active Problems Mild leukocytosis, WBC 12.5-10.9  Hospital day # 1  This patient is critically ill due to right MCA occlusion status post TNK and thrombectomy and at significant risk of neurological worsening, death form recurrent stroke, hemorrhagic conversion, bleeding from TNK. This patient's care requires constant monitoring of vital signs, hemodynamics, respiratory and cardiac monitoring, review of multiple databases, neurological assessment, discussion with family, other specialists and medical decision making of high complexity. I spent 45 minutes of neurocritical care time in the care of this patient. I had long discussion with  patient and mom at bedside, updated pt current condition, treatment  plan and potential prognosis, and answered all the questions.  They expressed understanding and appreciation.   Ary Cummins, MD PhD Stroke Neurology 02/05/2024 10:09 PM    To contact Stroke Continuity provider, please refer to [email or web address]. After hours, contact General Neurology

## 2024-02-05 NOTE — Anesthesia Postprocedure Evaluation (Signed)
 Anesthesia Post Note  Patient: Ruth Mullen  Procedure(s) Performed: IR PERCUTANEOUS ART THROMBECTOMY/INFUSION INTRACRANIAL INC DIAG ANGIO IR US  GUIDE VASC ACCESS RIGHT RADIOLOGY WITH ANESTHESIA     Patient location during evaluation: PACU Anesthesia Type: General Level of consciousness: sedated and patient cooperative Pain management: pain level controlled Vital Signs Assessment: post-procedure vital signs reviewed and stable Respiratory status: spontaneous breathing Cardiovascular status: stable Anesthetic complications: no   No notable events documented.  Last Vitals:  Vitals:   02/05/24 0700 02/05/24 0800  BP: (!) 145/86   Pulse: 80   Resp: 17   Temp:  36.8 C  SpO2: 98%     Last Pain:  Vitals:   02/05/24 0800  TempSrc: Oral  PainSc:                  Norleen Pope

## 2024-02-05 NOTE — Progress Notes (Signed)
 VO given by Dr. Lester to remove bedrest as long as right fem site looks good.   Pt is A&O x4. No acute distress. NIH has been 2 for my shift. Passed bedside swallow test and now on a HH diet.   Had a mild headache 4/10 that has now resolved after 650 mg of acetaminophen  given.   PT/OT needing to work w/pt but needed bedrest order removed before they could work w/her.

## 2024-02-06 ENCOUNTER — Inpatient Hospital Stay (HOSPITAL_COMMUNITY)

## 2024-02-06 DIAGNOSIS — I6389 Other cerebral infarction: Secondary | ICD-10-CM | POA: Diagnosis not present

## 2024-02-06 DIAGNOSIS — F1210 Cannabis abuse, uncomplicated: Secondary | ICD-10-CM | POA: Diagnosis not present

## 2024-02-06 DIAGNOSIS — F17210 Nicotine dependence, cigarettes, uncomplicated: Secondary | ICD-10-CM | POA: Diagnosis not present

## 2024-02-06 DIAGNOSIS — I639 Cerebral infarction, unspecified: Secondary | ICD-10-CM | POA: Diagnosis not present

## 2024-02-06 DIAGNOSIS — I63411 Cerebral infarction due to embolism of right middle cerebral artery: Secondary | ICD-10-CM | POA: Diagnosis not present

## 2024-02-06 DIAGNOSIS — E785 Hyperlipidemia, unspecified: Secondary | ICD-10-CM | POA: Diagnosis not present

## 2024-02-06 LAB — ECHOCARDIOGRAM COMPLETE
AR max vel: 2.26 cm2
AV Area VTI: 2.24 cm2
AV Area mean vel: 2.17 cm2
AV Mean grad: 4 mmHg
AV Peak grad: 5.5 mmHg
Ao pk vel: 1.17 m/s
Area-P 1/2: 4.1 cm2
Height: 60 in
S' Lateral: 2.5 cm
Weight: 2080 [oz_av]

## 2024-02-06 LAB — LUPUS ANTICOAGULANT PANEL
DRVVT: 39.9 s (ref 0.0–47.0)
PTT Lupus Anticoagulant: 31.4 s (ref 0.0–43.5)

## 2024-02-06 LAB — CBC
HCT: 33.3 % — ABNORMAL LOW (ref 36.0–46.0)
Hemoglobin: 11.4 g/dL — ABNORMAL LOW (ref 12.0–15.0)
MCH: 26.3 pg (ref 26.0–34.0)
MCHC: 34.2 g/dL (ref 30.0–36.0)
MCV: 76.9 fL — ABNORMAL LOW (ref 80.0–100.0)
Platelets: 383 K/uL (ref 150–400)
RBC: 4.33 MIL/uL (ref 3.87–5.11)
RDW: 15.2 % (ref 11.5–15.5)
WBC: 12.8 K/uL — ABNORMAL HIGH (ref 4.0–10.5)
nRBC: 0 % (ref 0.0–0.2)

## 2024-02-06 LAB — BASIC METABOLIC PANEL WITH GFR
Anion gap: 13 (ref 5–15)
BUN: 6 mg/dL (ref 6–20)
CO2: 23 mmol/L (ref 22–32)
Calcium: 8.3 mg/dL — ABNORMAL LOW (ref 8.9–10.3)
Chloride: 102 mmol/L (ref 98–111)
Creatinine, Ser: 0.66 mg/dL (ref 0.44–1.00)
GFR, Estimated: >60 mL/min (ref >60)
Glucose, Bld: 86 mg/dL (ref 70–99)
Potassium: 3.1 mmol/L — ABNORMAL LOW (ref 3.5–5.1)
Sodium: 138 mmol/L (ref 135–145)

## 2024-02-06 LAB — ANA W/REFLEX IF POSITIVE: Anti Nuclear Antibody (ANA): NEGATIVE

## 2024-02-06 LAB — PROTEIN S ACTIVITY: Protein S Activity: 84 % (ref 63–140)

## 2024-02-06 LAB — PROTEIN C ACTIVITY: Protein C Activity: 151 % (ref 73–180)

## 2024-02-06 LAB — PROTEIN S, TOTAL: Protein S Ag, Total: 92 % (ref 60–150)

## 2024-02-06 MED ORDER — GUAIFENESIN ER 600 MG PO TB12
600.0000 mg | ORAL_TABLET | Freq: Two times a day (BID) | ORAL | Status: DC | PRN
  Filled 2024-02-06 (×2): qty 1

## 2024-02-06 MED ORDER — POTASSIUM CHLORIDE 20 MEQ PO PACK
40.0000 meq | PACK | Freq: Once | ORAL | Status: AC
  Administered 2024-02-06: 40 meq via ORAL
  Filled 2024-02-06: qty 2

## 2024-02-06 MED ORDER — MENTHOL 3 MG MT LOZG
1.0000 | LOZENGE | OROMUCOSAL | Status: DC | PRN
  Filled 2024-02-06 (×2): qty 9

## 2024-02-06 MED ORDER — SALINE SPRAY 0.65 % NA SOLN
1.0000 | NASAL | Status: DC | PRN
  Administered 2024-02-06: 1 via NASAL
  Filled 2024-02-06: qty 44

## 2024-02-06 NOTE — Progress Notes (Signed)
 1750 placed virtual call to patient with family present. Refuse to answer or engage. Family appears fatigued had chaplain visit. Unable to complete admit documentation

## 2024-02-06 NOTE — Progress Notes (Signed)
 OT Cancellation Note  Patient Details Name: Ruth Mullen MRN: [id number] DOB: [birth year]   Cancelled Treatment:    Reason Eval/Treat Not Completed: Patient at procedure or test/ unavailable (Eating lunch. Will return at a later time)  [facility] 02/06/2024, 1:29 PM Kreg Sink, OT/L   Acute OT Clinical Specialist Acute Rehabilitation Services Pager [phone] Office [phone]

## 2024-02-06 NOTE — Progress Notes (Signed)
 Physical Therapy Treatment and Discharge Patient Details Name: Ruth Mullen MRN: [id number] DOB: 1986/02/25 Today's Date: 02/06/2024   History of Present Illness 38 year old female with a history of hypertension who presents with left-sided weakness. CTA was reviewed and she was found to have a proximal M2 occlusion. +Tnk and thrombectomy;    PT Comments  Patient reports walking to/from bathroom with nursing with no further issues with dizziness. No dizziness or imbalance with PT today. Ambulated 180 ft and up/down 10 steps with rail without assist. Scored 23/24 on Dynamic Gait Index, indicating fall risk WNL. Pt with incr time to spell world backwards and reports she thinks that would have been difficult before. Will update OT. Patient is discharged from PT with all goals met.     If plan is discharge home, recommend the following:     Can travel by private vehicle        Equipment Recommendations  None recommended by PT    Recommendations for Other Services       Precautions / Restrictions Precautions Precautions: Other (comment) Precaution/Restrictions Comments: SBP <160 Restrictions Weight Bearing Restrictions Per Provider Order: No     Mobility  Bed Mobility Overal bed mobility: Independent                  Transfers Overall transfer level: Independent Equipment used: None Transfers: Sit to/from Stand Sit to Stand: Independent           General transfer comment: denied dizziness or HA    Ambulation/Gait Ambulation/Gait assistance: Independent Gait Distance (Feet): 180 Feet Assistive device: None Gait Pattern/deviations: WFL(Within Functional Limits)   Gait velocity interpretation: >2.62 ft/sec, indicative of community ambulatory   General Gait Details: see DGI   Stairs Stairs: Yes Stairs assistance: Modified independent (Device/Increase time) Stair Management: One rail Right, Forwards Number of Stairs: 10 General stair comments: given  option to hold rail or not and pt chose to hold rail   Wheelchair Mobility     Tilt Bed    Modified Rankin (Stroke Patients Only) Modified Rankin (Stroke Patients Only) Pre-Morbid Rankin Score: No symptoms Modified Rankin: No symptoms     Balance Overall balance assessment: Independent                               Standardized Balance Assessment Standardized Balance Assessment : Dynamic Gait Index   Dynamic Gait Index Level Surface: Normal Change in Gait Speed: Normal Gait with Horizontal Head Turns: Normal Gait with Vertical Head Turns: Normal Gait and Pivot Turn: Normal Step Over Obstacle: Normal Step Around Obstacles: Normal Steps: Mild Impairment Total Score: 23      Communication Communication Communication: No apparent difficulties  Cognition Arousal: Alert Behavior During Therapy: WFL for tasks assessed/performed   PT - Cognitive impairments: No apparent impairments                         Following commands: Intact      Cueing Cueing Techniques: Verbal cues  Exercises      General Comments        Pertinent Vitals/Pain Pain Assessment Pain Assessment: No/denies pain    Home Living                          Prior Function            PT Goals (current goals can now  be found in the care plan section) Acute Rehab PT Goals Patient Stated Goal: be able to go home PT Goal Formulation: With patient Time For Goal Achievement: 02/19/24 Potential to Achieve Goals: Good Progress towards PT goals: Goals met/education completed, patient discharged from PT    Frequency           PT Plan      Co-evaluation              AM-PAC PT 6 Clicks Mobility   Outcome Measure  Help needed turning from your back to your side while in a flat bed without using bedrails?: None Help needed moving from lying on your back to sitting on the side of a flat bed without using bedrails?: None Help needed moving to and  from a bed to a chair (including a wheelchair)?: None Help needed standing up from a chair using your arms (e.g., wheelchair or bedside chair)?: None Help needed to walk in hospital room?: None Help needed climbing 3-5 steps with a railing? : None 6 Click Score: 24    End of Session Equipment Utilized During Treatment: Gait belt Activity Tolerance: Patient tolerated treatment well Patient left: with call bell/phone within reach;in bed;with family/visitor present Nurse Communication: Mobility status PT Visit Diagnosis: Difficulty in walking, not elsewhere classified (R26.2)     Time: 9058-9044 PT Time Calculation (min) (ACUTE ONLY): 14 min  Charges:    $Gait Training: 8-22 mins PT General Charges $$ ACUTE PT VISIT: 1 Visit                      Macario RAMAN, PT Acute Rehabilitation Services  Office [phone]    Macario SHAUNNA Soja 02/06/2024, 10:02 AM

## 2024-02-06 NOTE — Progress Notes (Signed)
 VASCULAR LAB    TCD bubble study and bilateral lower extremity venous duplex has been performed.  See CV proc for preliminary results.   Miyoshi Ligas, RVT 02/06/2024, 4:59 PM

## 2024-02-06 NOTE — Progress Notes (Signed)
 STROKE TEAM PROGRESS NOTE   SUBJECTIVE (INTERVAL HISTORY) Her mom and PT are at the bedside.  Pt is working with PT in the bathroom and hallway, doing well. Neuro intact. Pending TEE next week.    OBJECTIVE Temp:  [98.1 F (36.7 C)-99.4 F (37.4 C)] 98.8 F (37.1 C) (10/12 0759) Pulse Rate:  [79-103] 100 (10/12 0800) Cardiac Rhythm: Normal sinus rhythm (10/12 0800) Resp:  [15-25] 18 (10/12 0800) BP: (112-147)/(74-98) 127/79 (10/12 0800) SpO2:  [93 %-100 %] 98 % (10/12 0800)  No results for input(s): GLUCAP in the last 168 hours. Recent Labs  Lab 02/04/24 1441 02/05/24 0614 02/06/24 0346  NA 141  137 137 138  K 2.9*  3.7 3.3* 3.1*  CL 102  106 103 102  CO2 22 20* 23  GLUCOSE 115*  118* 97 86  BUN 5*  4* <5* 6  CREATININE 0.65  0.60 0.62 0.66  CALCIUM 9.6 8.4* 8.3*   Recent Labs  Lab 02/04/24 1441  AST 28  ALT 17  ALKPHOS 74  BILITOT 0.3  PROT 7.9  ALBUMIN 4.5   Recent Labs  Lab 02/04/24 1441 02/05/24 0614 02/06/24 0346  WBC 10.9* 14.2* 12.8*  NEUTROABS 5.1  --   --   HGB 13.5  14.6 12.1 11.4*  HCT 40.4  43.0 35.7* 33.3*  MCV 78.0* 76.8* 76.9*  PLT 486* 413* 383   No results for input(s): CKTOTAL, CKMB, CKMBINDEX, TROPONINI in the last 168 hours. Recent Labs    02/04/24 1441  LABPROT 13.8  INR 1.0   No results for input(s): COLORURINE, LABSPEC, PHURINE, GLUCOSEU, HGBUR, BILIRUBINUR, KETONESUR, PROTEINUR, UROBILINOGEN, NITRITE, LEUKOCYTESUR in the last 72 hours.  Invalid input(s): APPERANCEUR     Component Value Date/Time   CHOL 182 02/05/2024 0614   TRIG 42 02/05/2024 0614   HDL 50 02/05/2024 0614   CHOLHDL 3.6 02/05/2024 0614   VLDL 8 02/05/2024 0614   LDLCALC 124 (H) 02/05/2024 0614   Lab Results  Component Value Date   HGBA1C 5.2 02/05/2024      Component Value Date/Time   LABOPIA NONE DETECTED 02/04/2024 2325   COCAINSCRNUR NONE DETECTED 02/04/2024 2325   COCAINSCRNUR NEG 04/12/2013 1203    LABBENZ NONE DETECTED 02/04/2024 2325   LABBENZ NEG 04/12/2013 1203   AMPHETMU NONE DETECTED 02/04/2024 2325   THCU POSITIVE (A) 02/04/2024 2325   LABBARB NONE DETECTED 02/04/2024 2325    Recent Labs  Lab 02/04/24 1441  ETH <15    I have personally reviewed the radiological images below and agree with the radiology interpretations.  CT HEAD WO CONTRAST ( ) Result Date: 02/05/2024 EXAM: CT HEAD WITHOUT CONTRAST 02/05/2024 04:41:52 PM TECHNIQUE: CT of the head was performed without the administration of intravenous contrast. Automated exposure control, iterative reconstruction, and/or weight based adjustment of the mA/kV was utilized to reduce the radiation dose to as low as reasonably achievable. COMPARISON: MRI head without contrast 02/05/2024 at 3:31 am. CLINICAL HISTORY: Stroke, follow up. Altered Mental Status. FINDINGS: BRAIN AND VENTRICLES: Acute/subacute nonhemorrhagic infarcts involving the right lentiform nucleus and superior caudate head are again noted. No acute hemorrhage is present. The small cortical infarct does not discretely visualized by CT. No new ischemic infarcts are present. No hydrocephalus. No extra-axial collection. No mass effect or midline shift. ORBITS: No acute abnormality. SINUSES: No acute abnormality. SOFT TISSUES AND SKULL: No acute soft tissue abnormality. No skull fracture. IMPRESSION: 1. Acute/subacute nonhemorrhagic infarcts involving the right lentiform nucleus and superior caudate head, as seen  on prior MRI; no new ischemic infarcts. 2. No acute intracranial hemorrhage. Electronically signed by: Lonni Necessary MD 02/05/2024 05:04 PM EDT RP Workstation: [id number]   MR ANGIO HEAD WO CONTRAST Result Date: 02/05/2024 EXAM: MR Angiography Head without intravenous Contrast. 02/05/2024 04:08:56 AM TECHNIQUE: Magnetic resonance angiography images of the head without intravenous contrast. Multiplanar 2D and 3D reformatted images are provided for review.  COMPARISON: CTA head and neck 02/04/2024. CLINICAL HISTORY: 38 year old female with stroke, follow up. FINDINGS: ANTERIOR CIRCULATION: Antegrade flow in both ICA siphons appears symmetric. Patent carotid terminus. No significant stenosis of the internal carotid arteries. Patent ACA origins. Normal anterior communicating artery. No significant stenosis of the anterior cerebral arteries. Patent MCA origins. Right MCA trifurcation now appears patent and improved right MCA branch flow signal/appearance compared to the CTA 02/04/2024 (series 1024 image 30). No residual stenosis or occlusion identified. No significant stenosis of the left middle cerebral artery. No aneurysm. POSTERIOR CIRCULATION: Antegrade flow in the posterior circulation with dominant distal right vertebral artery. No significant stenosis of the vertebral arteries. No significant stenosis of the basilar artery. No significant stenosis of the posterior cerebral arteries. No aneurysm. IMPRESSION: 1. Revascularized right MCA trifurcation and substantially improved right MCA branch appearance. No residual stenosis or occlusion identified. 2. MRI reported separately. Electronically signed by: Helayne Hurst MD 02/05/2024 04:24 AM EDT RP Workstation: [id number]   MR BRAIN WO CONTRAST Result Date: 02/05/2024 EXAM: MR Brain without Intravenous Contrast. CLINICAL HISTORY: 38 year old female code stroke presentation yesterday. Right MCA M2 branch occlusion status post endovascular revascularization. TECHNIQUE: Magnetic resonance images of the brain without intravenous contrast in multiple planes. CONTRAST: Without. COMPARISON: Head CT, CTA yesterday. Intracranial MRI today is reported separately. FINDINGS: BRAIN: Restricted diffusion is present in the right basal ganglia and minimal cortical restricted diffusion is superimposed at the anterior right frontal operculum (series 5 image 84). T2 and FLAIR hyperintense cytotoxic edema is noted. Trace abnormal  cortical diffusion is also present at the right insula and in the posterior right temporal lobe (series 5 images 72 and 74). No left hemisphere or posterior fossa diffusion restriction. No acute hemorrhagic transformation. SWI suggests possible chronic microhemorrhage in the central pons (series 12 image 18). No other chronic cerebral blood products. A small focus of chronic cortical encephalomalacia is present in the inferior right parietal lobe on series 11 image 29. Scattered moderate for age cerebral white matter T2 and FLAIR hyperintensity is seen in both hemispheres. No intracranial mass effect. No midline shift or extra-axial fluid collection. No cerebellar tonsillar ectopia. The central arterial and venous flow voids are patent. VENTRICLES: No hydrocephalus. ORBITS: The orbits are normal. SINUSES AND MASTOIDS: The sinuses and mastoid air cells are clear. BONES: No acute fracture or focal osseous lesion. IMPRESSION: 1. Acute right basal ganglia infarct with subtle additional scattered cortical infarcts in the right MCA territory. Cytotoxic edema, but no hemorrhagic transformation or mass effect. 2. Possible chronic microhemorrhage in the central pons. Subtle chronic cortical encephalomalacia suspected in the right parietal lobe. And moderate for age chronic white matter changes otherwise. Electronically signed by: Helayne Hurst MD 02/05/2024 04:19 AM EDT RP Workstation: [id number]   CT HEAD WO CONTRAST ( ) Result Date: 02/04/2024 EXAM: CT HEAD WITHOUT CONTRAST 02/04/2024 10:06:26 PM TECHNIQUE: CT of the head was performed without the administration of intravenous contrast. Automated exposure control, iterative reconstruction, and/or weight based adjustment of the mA/kV was utilized to reduce the radiation dose to as low as reasonably achievable. COMPARISON: None  available. CLINICAL HISTORY: Neuro deficit, acute, stroke suspected. FINDINGS: BRAIN AND VENTRICLES: No acute hemorrhage. No evidence of acute  infarct. No hydrocephalus. No extra-axial collection. No mass effect or midline shift. ORBITS: No acute abnormality. SINUSES: No acute abnormality. SOFT TISSUES AND SKULL: No acute soft tissue abnormality. No skull fracture. IMPRESSION: 1. No evidence of acute intracranial abnormality. Electronically signed by: Gilmore Molt MD 02/04/2024 10:36 PM EDT RP Workstation: [id number]   CT ANGIO HEAD NECK W WO CM (CODE STROKE) Result Date: 02/04/2024 EXAM: CTA HEAD AND NECK WITH AND WITHOUT 02/04/2024 03:06:21 PM TECHNIQUE: CTA of the head and neck was performed with and without the administration of 100 mL of iohexol  (OMNIPAQUE ) 350 MG/ML injection. Multiplanar 2D and/or 3D reformatted images are provided for review. Automated exposure control, iterative reconstruction, and/or weight based adjustment of the mA/kV was utilized to reduce the radiation dose to as low as reasonably achievable. Stenosis of the internal carotid arteries measured using NASCET criteria. COMPARISON: CT head without contrast 02/04/2024 CLINICAL HISTORY: Neuro deficit, acute, stroke suspected. FINDINGS: CTA NECK: AORTIC ARCH AND ARCH VESSELS: No dissection or arterial injury. No significant stenosis of the brachiocephalic or subclavian arteries. CERVICAL CAROTID ARTERIES: No dissection, arterial injury, or hemodynamically significant stenosis by NASCET criteria. CERVICAL VERTEBRAL ARTERIES: No dissection, arterial injury, or significant stenosis. LUNGS AND MEDIASTINUM: Unremarkable. SOFT TISSUES: No acute abnormality. BONES: No acute abnormality. CTA HEAD: ANTERIOR CIRCULATION: No significant stenosis of the internal carotid arteries. No significant stenosis of the anterior cerebral arteries. Proximal right M2 occlusion is present just distal to inferior temporal branches. More distal posterolateral branches are filled by pial collateral flow. The occlusion impacts much of the right MCA distribution. The left middle cerebral artery is patent  proximally without significant stenosis. No aneurysm. POSTERIOR CIRCULATION: No significant stenosis of the posterior cerebral arteries. No significant stenosis of the basilar artery. No significant stenosis of the vertebral arteries. No aneurysm. OTHER: No dural venous sinus thrombosis on this non-dedicated study. Findings were communicated to doctor Aisha Seals by voice and text message at 3:05 PM. IMPRESSION: 1. Proximal right M2 occlusion just distal to inferior temporal branches, impacting much of the right MCA distribution. 2. More distal posterolateral branches fill via collateral flow. 3. Critical results communicated to Dr. Aisha Seals at 3:05 PM. Electronically signed by: Lonni Necessary MD 02/04/2024 03:19 PM EDT RP Workstation: [id number]   CT HEAD CODE STROKE WO CONTRAST (LKW 0-4.5h, LVO 0-24h) Result Date: 02/04/2024 EXAM: CT HEAD WITHOUT CONTRAST 02/04/2024 02:46:19 PM TECHNIQUE: CT of the head was performed without the administration of intravenous contrast. Automated exposure control, iterative reconstruction, and/or weight based adjustment of the mA/kV was utilized to reduce the radiation dose to as low as reasonably achievable. COMPARISON: None available. CLINICAL HISTORY: Neuro deficit, acute, stroke suspected. Left sided weakness. FINDINGS: BRAIN AND VENTRICLES: No acute hemorrhage. No evidence of acute infarct. No hydrocephalus. No extra-axial collection. No mass effect or midline shift. [place] stroke program early CT (ASPECT) score: Ganglionic (caudate, IC, lentiform nucleus, insula, M1-M3): 7 Supraganglionic (M4-M6): 3 Total: 10 ORBITS: No acute abnormality. SINUSES: No acute abnormality. SOFT TISSUES AND SKULL: No acute soft tissue abnormality. No skull fracture. Kirkpatrick 252 pm IMPRESSION: 1. No acute intracranial abnormality.  ASPECTS 10/10 The pertinent results were texted to Dr. Seals via the [facility] system at 2:54 pm. Electronically signed by: Lonni Necessary MD 02/04/2024 02:55 PM EDT RP Workstation: [id number]     PHYSICAL EXAM  Temp:  [98.1 F (36.7 C)-99.4 F (37.4 C)]  98.8 F (37.1 C) (10/12 0759) Pulse Rate:  [79-103] 100 (10/12 0800) Resp:  [15-25] 18 (10/12 0800) BP: (112-147)/(74-98) 127/79 (10/12 0800) SpO2:  [93 %-100 %] 98 % (10/12 0800)  General - Well nourished, well developed, in no apparent distress.  Ophthalmologic - fundi not visualized due to noncooperation.  Cardiovascular - Regular rhythm and rate.  Mental Status -  Level of arousal and orientation to time, place, and person were intact. Language including expression, naming, repetition, comprehension was assessed and found intact. Fund of Knowledge was assessed and was intact.  Cranial Nerves II - XII - II - Visual field intact OU. III, IV, VI - Extraocular movements intact. V - Facial sensation intact bilaterally. VII - Facial movement intact bilaterally. VIII - Hearing & vestibular intact bilaterally. X - Palate elevates symmetrically. XI - Chin turning & shoulder shrug intact bilaterally XII - Tongue protrusion intact.  Motor Strength - The patient's strength was normal in all extremities and pronator drift was absent.  Bulk was normal and fasciculations were absent.   Motor Tone - Muscle tone was assessed at the neck and appendages and was normal.  Reflexes - The patient's reflexes were symmetrical in all extremities and she had no pathological reflexes.  Sensory - Light touch, temperature/pinprick were assessed and were symmetrical.    Coordination - The patient had normal movements in the hands and feet with no ataxia or dysmetria.  Tremor was absent.  Gait and Station - deferred.   ASSESSMENT/PLAN Ms. SHAQUALA BROEKER is a 38 y.o. female with history of hypertension and heavy smoker admitted for left-sided weakness, headache. TNK given.    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 MRA  Revascularized right MCA trifurcation and substantially improved right MCA branch appearance. 2D Echo pending Will consider TEE if 2D echo unrevealing TTE bubble study pending LE venous Doppler pending Hypercoagulable workup pending LDL 124 HgbA1c 5.2 UDS positive for THC Lovenox  for VTE prophylaxis No antithrombotic prior to admission, now on aspirin 81 mg daily and clopidogrel 75 mg daily DAPT for 3 weeks and then ASA alone Patient counseled to be compliant with her antithrombotic medications Ongoing aggressive stroke risk factor management Therapy recommendations:  none Disposition:  pending  Hypertension Stable Home BP monitoring Long term BP goal normotensive  Hyperlipidemia Home meds: None LDL 124, goal < 70 Now on Lipitor 40 Continue statin at discharge Educated on avoid pregnancy while on statin  Tobacco abuse Current heavy smoker Smoking cessation counseling provided Nicotine patch provided Pt is willing to quit  Other Stroke Risk Factors Substance abuse with THC, cessation education provided. Pt is willing to quit  Other Active Problems Mild leukocytosis, WBC 12.5-10.9-14.2-12.8 Mom reported tremendous stress at home with depressed mood   Hospital day # 2  This patient is critically ill due to right MCA occlusion status post TNK and thrombectomy and at significant risk of neurological worsening, death form recurrent stroke, hemorrhagic conversion, bleeding from TNK. This patient's care requires constant monitoring of vital signs, hemodynamics, respiratory and cardiac monitoring, review of multiple databases, neurological assessment, discussion with family, other  specialists and medical decision making of high complexity. I  spent 30 minutes of neurocritical care time in the care of this patient. I had discussion with patient mom at bedside, updated pt current condition, treatment plan and potential prognosis, and answered all the questions.  She expressed understanding and appreciation.   Ary Cummins, MD PhD Stroke Neurology 02/06/2024 10:27 AM    To contact Stroke Continuity provider, please refer to [email or web address]. After hours, contact General Neurology

## 2024-02-07 DIAGNOSIS — F17210 Nicotine dependence, cigarettes, uncomplicated: Secondary | ICD-10-CM

## 2024-02-07 DIAGNOSIS — E785 Hyperlipidemia, unspecified: Secondary | ICD-10-CM

## 2024-02-07 DIAGNOSIS — I63411 Cerebral infarction due to embolism of right middle cerebral artery: Secondary | ICD-10-CM

## 2024-02-07 DIAGNOSIS — F1210 Cannabis abuse, uncomplicated: Secondary | ICD-10-CM

## 2024-02-07 LAB — CBC
HCT: 36.2 % (ref 36.0–46.0)
Hemoglobin: 12 g/dL (ref 12.0–15.0)
MCH: 25.6 pg — ABNORMAL LOW (ref 26.0–34.0)
MCHC: 33.1 g/dL (ref 30.0–36.0)
MCV: 77.4 fL — ABNORMAL LOW (ref 80.0–100.0)
Platelets: 404 K/uL — ABNORMAL HIGH (ref 150–400)
RBC: 4.68 MIL/uL (ref 3.87–5.11)
RDW: 14.9 % (ref 11.5–15.5)
WBC: 11.5 K/uL — ABNORMAL HIGH (ref 4.0–10.5)
nRBC: 0 % (ref 0.0–0.2)

## 2024-02-07 LAB — BASIC METABOLIC PANEL WITH GFR
Anion gap: 11 (ref 5–15)
BUN: 5 mg/dL — ABNORMAL LOW (ref 6–20)
CO2: 21 mmol/L — ABNORMAL LOW (ref 22–32)
Calcium: 8.3 mg/dL — ABNORMAL LOW (ref 8.9–10.3)
Chloride: 102 mmol/L (ref 98–111)
Creatinine, Ser: 0.6 mg/dL (ref 0.44–1.00)
GFR, Estimated: >60 mL/min (ref >60)
Glucose, Bld: 85 mg/dL (ref 70–99)
Potassium: 3.2 mmol/L — ABNORMAL LOW (ref 3.5–5.1)
Sodium: 134 mmol/L — ABNORMAL LOW (ref 135–145)

## 2024-02-07 LAB — CBG MONITORING, ED: Glucose-Capillary: 119 mg/dL — ABNORMAL HIGH (ref 70–99)

## 2024-02-07 LAB — HOMOCYSTEINE: Homocysteine: 6.6 umol/L (ref 0.0–14.5)

## 2024-02-07 MED ORDER — POTASSIUM CHLORIDE CRYS ER 20 MEQ PO TBCR
40.0000 meq | EXTENDED_RELEASE_TABLET | Freq: Two times a day (BID) | ORAL | Status: AC
  Administered 2024-02-07 – 2024-02-08 (×2): 40 meq via ORAL
  Filled 2024-02-07 (×2): qty 2

## 2024-02-07 NOTE — Progress Notes (Addendum)
 STROKE TEAM PROGRESS NOTE   SUBJECTIVE (INTERVAL HISTORY) Seen in room with mom and the bedside.  She is eager for discharge.  Planning for TEE tomorrow. Potassium replaced. Neuro exam stable, hemodynamically stable.   OBJECTIVE Temp:  [97.5 F (36.4 C)-98.9 F (37.2 C)] 98.4 F (36.9 C) (10/13 0724) Pulse Rate:  [73-101] 90 (10/13 0724) Cardiac Rhythm: Sinus tachycardia (10/12 1900) Resp:  [17-28] 17 (10/13 0724) BP: (119-143)/(68-90) 122/86 (10/13 0724) SpO2:  [92 %-100 %] 100 % (10/13 0724)  No results for input(s): GLUCAP in the last 168 hours. Recent Labs  Lab 02/04/24 1441 02/05/24 0614 02/06/24 0346 02/07/24 0146  NA 141  137 137 138 134*  K 2.9*  3.7 3.3* 3.1* 3.2*  CL 102  106 103 102 102  CO2 22 20* 23 21*  GLUCOSE 115*  118* 97 86 85  BUN 5*  4* <5* 6 5*  CREATININE 0.65  0.60 0.62 0.66 0.60  CALCIUM 9.6 8.4* 8.3* 8.3*   Recent Labs  Lab 02/04/24 1441  AST 28  ALT 17  ALKPHOS 74  BILITOT 0.3  PROT 7.9  ALBUMIN 4.5   Recent Labs  Lab 02/04/24 1441 02/05/24 0614 02/06/24 0346 02/07/24 0146  WBC 10.9* 14.2* 12.8* 11.5*  NEUTROABS 5.1  --   --   --   HGB 13.5  14.6 12.1 11.4* 12.0  HCT 40.4  43.0 35.7* 33.3* 36.2  MCV 78.0* 76.8* 76.9* 77.4*  PLT 486* 413* 383 404*   No results for input(s): CKTOTAL, CKMB, CKMBINDEX, TROPONINI in the last 168 hours. Recent Labs    02/04/24 1441  LABPROT 13.8  INR 1.0   No results for input(s): COLORURINE, LABSPEC, PHURINE, GLUCOSEU, HGBUR, BILIRUBINUR, KETONESUR, PROTEINUR, UROBILINOGEN, NITRITE, LEUKOCYTESUR in the last 72 hours.  Invalid input(s): APPERANCEUR     Component Value Date/Time   CHOL 182 02/05/2024 0614   TRIG 42 02/05/2024 0614   HDL 50 02/05/2024 0614   CHOLHDL 3.6 02/05/2024 0614   VLDL 8 02/05/2024 0614   LDLCALC 124 (H) 02/05/2024 0614   Lab Results  Component Value Date   HGBA1C 5.2 02/05/2024      Component Value Date/Time   LABOPIA  NONE DETECTED 02/04/2024 2325   COCAINSCRNUR NONE DETECTED 02/04/2024 2325   COCAINSCRNUR NEG 04/12/2013 1203   LABBENZ NONE DETECTED 02/04/2024 2325   LABBENZ NEG 04/12/2013 1203   AMPHETMU NONE DETECTED 02/04/2024 2325   THCU POSITIVE (A) 02/04/2024 2325   LABBARB NONE DETECTED 02/04/2024 2325    Recent Labs  Lab 02/04/24 1441  ETH <15    I have personally reviewed the radiological images below and agree with the radiology interpretations.  IR PERCUTANEOUS ART THROMBECTOMY/INFUSION INTRACRANIAL INC DIAG ANGIO Result Date: 02/07/2024 INDICATION: Acute Large Vessel occlusion, right M1 EXAM: Mechanical thrombectomy, CONSENT: Consent was obtained from the patient's son MEDICATIONS: No additional ANESTHESIA/SEDATION: General CONTRAST:  15mL OMNIPAQUE  IOHEXOL  300 MG/ML  SOLN FLUOROSCOPY: Radiation Exposure Index (as provided by the fluoroscopic device): 134 MGy reference air Kerma COMPLICATIONS: None immediate. TECHNIQUE: Maximal Sterile Barrier Technique was utilized including caps, mask, sterile gowns, sterile gloves, sterile drape, hand hygiene and skin antiseptic. A timeout was performed prior to the initiation of the procedure. PROCEDURE: Femoral access was obtained with ultrasound using direct visualization of the needle puncture into the artery. A 90 cm neuron max sheath was placed. The neuron max was advanced over a penumbra select parents teen into the right internal carotid artery. The red 72 SENDIT catheter was  advanced over a synchro support to the level of the occlusion and connected to suction. The catheter was then withdrawn under aspiration. This resulted in aspiration of the embolus. The follow-up arteriogram demonstrated normal flow in the internal carotid artery, anterior and middle cerebral artery territories with no branch occlusions. TICI 3. Hemostasis was obtained with 6 [place] Angio-Seal FINDINGS: Right internal carotid artery first arteriogram: There is an embolus occluding  the M1 segment just distal to the anterior temporal artery. The anterior cerebral artery is normal. There are good pial collaterals. Right internal carotid artery after thrombectomy: The M1 segment and all proximal branches are patent. No distal branch occlusions are present. The catheter is obstructing the flow in the internal carotid artery. Right common carotid artery after thrombectomy: The internal carotid artery, anterior and middle cerebral artery territories are patent with normal flow and no distal branch occlusion. Mild spasm in the internal carotid artery and middle cerebral artery M1 segment. IMPRESSION: Right middle cerebral artery embolus removed with mechanical embolectomy. TICI 3 Electronically Signed   By: Nancyann Burns M.D.   On: 02/07/2024 08:17   IR US  Guide Vasc Access Right Result Date: 02/07/2024 INDICATION: Acute Large Vessel occlusion, right M1 EXAM: Mechanical thrombectomy, CONSENT: Consent was obtained from the patient's son MEDICATIONS: No additional ANESTHESIA/SEDATION: General CONTRAST:  15mL OMNIPAQUE  IOHEXOL  300 MG/ML  SOLN FLUOROSCOPY: Radiation Exposure Index (as provided by the fluoroscopic device): 134 MGy reference air Kerma COMPLICATIONS: None immediate. TECHNIQUE: Maximal Sterile Barrier Technique was utilized including caps, mask, sterile gowns, sterile gloves, sterile drape, hand hygiene and skin antiseptic. A timeout was performed prior to the initiation of the procedure. PROCEDURE: Femoral access was obtained with ultrasound using direct visualization of the needle puncture into the artery. A 90 cm neuron max sheath was placed. The neuron max was advanced over a penumbra select parents teen into the right internal carotid artery. The red 72 SENDIT catheter was advanced over a synchro support to the level of the occlusion and connected to suction. The catheter was then withdrawn under aspiration. This resulted in aspiration of the embolus. The follow-up arteriogram  demonstrated normal flow in the internal carotid artery, anterior and middle cerebral artery territories with no branch occlusions. TICI 3. Hemostasis was obtained with 6 [place] Angio-Seal FINDINGS: Right internal carotid artery first arteriogram: There is an embolus occluding the M1 segment just distal to the anterior temporal artery. The anterior cerebral artery is normal. There are good pial collaterals. Right internal carotid artery after thrombectomy: The M1 segment and all proximal branches are patent. No distal branch occlusions are present. The catheter is obstructing the flow in the internal carotid artery. Right common carotid artery after thrombectomy: The internal carotid artery, anterior and middle cerebral artery territories are patent with normal flow and no distal branch occlusion. Mild spasm in the internal carotid artery and middle cerebral artery M1 segment. IMPRESSION: Right middle cerebral artery embolus removed with mechanical embolectomy. TICI 3 Electronically Signed   By: Nancyann Burns M.D.   On: 02/07/2024 08:17   VAS US  LOWER EXTREMITY VENOUS (DVT) Result Date: 02/06/2024  Lower Venous DVT Study Patient Name:  ROSEALYNN MATEUS  Date of Exam:   02/06/2024 Medical Rec #: [id number]         Accession #:    [id number] Date of Birth: [birth year]        Patient Gender: F Patient Age:   65 years Exam Location:  [facility] Procedure:  VAS US  LOWER EXTREMITY VENOUS (DVT) Referring Phys: ARY Haytham Maher --------------------------------------------------------------------------------  Indications: Stroke.  Comparison Study: No prior study on file Performing Technologist: Alberta Lis RVS  Examination Guidelines: A complete evaluation includes B-mode imaging, spectral Doppler, color Doppler, and power Doppler as needed of all accessible portions of each vessel. Bilateral testing is considered an integral part of a complete examination. Limited examinations for reoccurring indications may be  performed as noted. The reflux portion of the exam is performed with the patient in reverse Trendelenburg.  +---------+---------------+---------+-----------+----------+--------------+ RIGHT    CompressibilityPhasicitySpontaneityPropertiesThrombus Aging +---------+---------------+---------+-----------+----------+--------------+ CFV      Full           Yes      Yes                                 +---------+---------------+---------+-----------+----------+--------------+ SFJ      Full                                                        +---------+---------------+---------+-----------+----------+--------------+ FV Prox  Full           Yes      Yes                                 +---------+---------------+---------+-----------+----------+--------------+ FV Mid   Full                                                        +---------+---------------+---------+-----------+----------+--------------+ FV DistalFull           Yes      Yes                                 +---------+---------------+---------+-----------+----------+--------------+ PFV      Full           Yes      Yes                                 +---------+---------------+---------+-----------+----------+--------------+ POP      Full           Yes      Yes                                 +---------+---------------+---------+-----------+----------+--------------+ PTV      Full                                                        +---------+---------------+---------+-----------+----------+--------------+ PERO     Full                                                        +---------+---------------+---------+-----------+----------+--------------+   +---------+---------------+---------+-----------+----------+--------------+  LEFT     CompressibilityPhasicitySpontaneityPropertiesThrombus Aging +---------+---------------+---------+-----------+----------+--------------+ CFV      Full            Yes      Yes                                 +---------+---------------+---------+-----------+----------+--------------+ SFJ      Full                                                        +---------+---------------+---------+-----------+----------+--------------+ FV Prox  Full                                                        +---------+---------------+---------+-----------+----------+--------------+ FV Mid   Full                                                        +---------+---------------+---------+-----------+----------+--------------+ FV DistalFull                                                        +---------+---------------+---------+-----------+----------+--------------+ PFV      Full                                                        +---------+---------------+---------+-----------+----------+--------------+ POP      Full           Yes      Yes                                 +---------+---------------+---------+-----------+----------+--------------+ PTV      Full                                                        +---------+---------------+---------+-----------+----------+--------------+ PERO     Full                                                        +---------+---------------+---------+-----------+----------+--------------+     Summary: BILATERAL: - No evidence of deep vein thrombosis seen in the lower extremities, bilaterally. -No evidence of popliteal cyst, bilaterally.   *See table(s) above for measurements and observations. Electronically signed by Gaile New MD on 02/06/2024 at 8:45:26 PM.  Final    ECHOCARDIOGRAM COMPLETE Result Date: 02/06/2024    ECHOCARDIOGRAM REPORT   Patient Name:   BELIA FEBO Date of Exam: 02/06/2024 Medical Rec #:  [id number]        Height:       60.0 in Accession #:    [id number]       Weight:       130.0 lb Date of Birth:  Mar 17, 1986       BSA:          1.554 m  Patient Age:    37 years         BP:           139/96 mmHg Patient Gender: F                HR:           85 bpm. Exam Location:  Inpatient Procedure: 2D Echo, Cardiac Doppler and Color Doppler (Both Spectral and Color            Flow Doppler were utilized during procedure). Indications:    Stroke I63.9  History:        Patient has no prior history of Echocardiogram examinations.                 Risk Factors:Current Smoker and Hypertension.  Sonographer:    Jayson Gaskins Referring Phys: [id number] Myshawn Chir[place]oga IMPRESSIONS  1. Left ventricular ejection fraction, by estimation, is 60 to 65%. The left ventricle has normal function. The left ventricle has no regional wall motion abnormalities. Left ventricular diastolic parameters were normal.  2. Right ventricular systolic function is normal. The right ventricular size is normal. Tricuspid regurgitation signal is inadequate for assessing PA pressure.  3. The mitral valve is normal in structure. Trivial mitral valve regurgitation. No evidence of mitral stenosis.  4. The aortic valve is normal in structure. Aortic valve regurgitation is not visualized. No aortic stenosis is present.  5. The inferior vena cava is normal in size with <50% respiratory variability, suggesting right atrial pressure of 8 mmHg. FINDINGS  Left Ventricle: Left ventricular ejection fraction, by estimation, is 60 to 65%. The left ventricle has normal function. The left ventricle has no regional wall motion abnormalities. The left ventricular internal cavity size was normal in size. There is  no left ventricular hypertrophy. Left ventricular diastolic parameters were normal. Right Ventricle: The right ventricular size is normal. No increase in right ventricular wall thickness. Right ventricular systolic function is normal. Tricuspid regurgitation signal is inadequate for assessing PA pressure. Left Atrium: Left atrial size was normal in size. Right Atrium: Right atrial size was normal in size.  Pericardium: There is no evidence of pericardial effusion. Mitral Valve: The mitral valve is normal in structure. Trivial mitral valve regurgitation. No evidence of mitral valve stenosis. Tricuspid Valve: The tricuspid valve is normal in structure. Tricuspid valve regurgitation is trivial. No evidence of tricuspid stenosis. Aortic Valve: The aortic valve is normal in structure. Aortic valve regurgitation is not visualized. No aortic stenosis is present. Aortic valve mean gradient measures 4.0 mmHg. Aortic valve peak gradient measures 5.5 mmHg. Aortic valve area, by VTI measures 2.24 cm. Pulmonic Valve: The pulmonic valve was grossly normal. Pulmonic valve regurgitation is trivial. No evidence of pulmonic stenosis. Aorta: The aortic root and ascending aorta are structurally normal, with no evidence of dilitation. Venous: The inferior vena cava is normal in size with less than 50% respiratory variability, suggesting right atrial pressure of 8  mmHg. IAS/Shunts: No atrial level shunt detected by color flow Doppler.  LEFT VENTRICLE PLAX 2D LVIDd:         3.90 cm   Diastology LVIDs:         2.50 cm   LV e' medial:    9.79 cm/s LV PW:         0.70 cm   LV E/e' medial:  8.2 LV IVS:        0.80 cm   LV e' lateral:   11.10 cm/s LVOT diam:     1.80 cm   LV E/e' lateral: 7.3 LV SV:         50 LV SV Index:   32 LVOT Area:     2.54 cm  RIGHT VENTRICLE RV S prime:     13.20 cm/s TAPSE (M-mode): 1.8 cm LEFT ATRIUM             Index        RIGHT ATRIUM          Index LA Vol (A2C):   33.1 ml 21.30 ml/m  RA Area:     9.42 cm LA Vol (A4C):   24.4 ml 15.70 ml/m  RA Volume:   20.80 ml 13.38 ml/m LA Biplane Vol: 31.0 ml 19.94 ml/m  AORTIC VALVE AV Area (Vmax):    2.26 cm AV Area (Vmean):   2.17 cm AV Area (VTI):     2.24 cm AV Vmax:           117.00 cm/s AV Vmean:          91.000 cm/s AV VTI:            0.223 m AV Peak Grad:      5.5 mmHg AV Mean Grad:      4.0 mmHg LVOT Vmax:         104.00 cm/s LVOT Vmean:        77.600 cm/s  LVOT VTI:          0.196 m LVOT/AV VTI ratio: 0.88  AORTA Ao Root diam: 2.60 cm MITRAL VALVE MV Area (PHT): 4.10 cm    SHUNTS MV Decel Time: 185 msec    Systemic VTI:  0.20 m MV E velocity: 80.70 cm/s  Systemic Diam: 1.80 cm MV A velocity: 76.50 cm/s MV E/A ratio:  1.05 Mihai Croitoru MD Electronically signed by Jerel Balding MD Signature Date/Time: 02/06/2024/1:28:34 PM    Final    CT HEAD WO CONTRAST ( ) Result Date: 02/05/2024 EXAM: CT HEAD WITHOUT CONTRAST 02/05/2024 04:41:52 PM TECHNIQUE: CT of the head was performed without the administration of intravenous contrast. Automated exposure control, iterative reconstruction, and/or weight based adjustment of the mA/kV was utilized to reduce the radiation dose to as low as reasonably achievable. COMPARISON: MRI head without contrast 02/05/2024 at 3:31 am. CLINICAL HISTORY: Stroke, follow up. Altered Mental Status. FINDINGS: BRAIN AND VENTRICLES: Acute/subacute nonhemorrhagic infarcts involving the right lentiform nucleus and superior caudate head are again noted. No acute hemorrhage is present. The small cortical infarct does not discretely visualized by CT. No new ischemic infarcts are present. No hydrocephalus. No extra-axial collection. No mass effect or midline shift. ORBITS: No acute abnormality. SINUSES: No acute abnormality. SOFT TISSUES AND SKULL: No acute soft tissue abnormality. No skull fracture. IMPRESSION: 1. Acute/subacute nonhemorrhagic infarcts involving the right lentiform nucleus and superior caudate head, as seen on prior MRI; no new ischemic infarcts. 2. No acute intracranial hemorrhage. Electronically signed by: Lonni Necessary MD 02/05/2024  05:04 PM EDT RP Workstation: [id number]   MR ANGIO HEAD WO CONTRAST Result Date: 02/05/2024 EXAM: MR Angiography Head without intravenous Contrast. 02/05/2024 04:08:56 AM TECHNIQUE: Magnetic resonance angiography images of the head without intravenous contrast. Multiplanar 2D and 3D  reformatted images are provided for review. COMPARISON: CTA head and neck 02/04/2024. CLINICAL HISTORY: 38 year old female with stroke, follow up. FINDINGS: ANTERIOR CIRCULATION: Antegrade flow in both ICA siphons appears symmetric. Patent carotid terminus. No significant stenosis of the internal carotid arteries. Patent ACA origins. Normal anterior communicating artery. No significant stenosis of the anterior cerebral arteries. Patent MCA origins. Right MCA trifurcation now appears patent and improved right MCA branch flow signal/appearance compared to the CTA 02/04/2024 (series 1024 image 30). No residual stenosis or occlusion identified. No significant stenosis of the left middle cerebral artery. No aneurysm. POSTERIOR CIRCULATION: Antegrade flow in the posterior circulation with dominant distal right vertebral artery. No significant stenosis of the vertebral arteries. No significant stenosis of the basilar artery. No significant stenosis of the posterior cerebral arteries. No aneurysm. IMPRESSION: 1. Revascularized right MCA trifurcation and substantially improved right MCA branch appearance. No residual stenosis or occlusion identified. 2. MRI reported separately. Electronically signed by: Helayne Hurst MD 02/05/2024 04:24 AM EDT RP Workstation: [id number]   MR BRAIN WO CONTRAST Result Date: 02/05/2024 EXAM: MR Brain without Intravenous Contrast. CLINICAL HISTORY: 38 year old female code stroke presentation yesterday. Right MCA M2 branch occlusion status post endovascular revascularization. TECHNIQUE: Magnetic resonance images of the brain without intravenous contrast in multiple planes. CONTRAST: Without. COMPARISON: Head CT, CTA yesterday. Intracranial MRI today is reported separately. FINDINGS: BRAIN: Restricted diffusion is present in the right basal ganglia and minimal cortical restricted diffusion is superimposed at the anterior right frontal operculum (series 5 image 84). T2 and FLAIR hyperintense  cytotoxic edema is noted. Trace abnormal cortical diffusion is also present at the right insula and in the posterior right temporal lobe (series 5 images 72 and 74). No left hemisphere or posterior fossa diffusion restriction. No acute hemorrhagic transformation. SWI suggests possible chronic microhemorrhage in the central pons (series 12 image 18). No other chronic cerebral blood products. A small focus of chronic cortical encephalomalacia is present in the inferior right parietal lobe on series 11 image 29. Scattered moderate for age cerebral white matter T2 and FLAIR hyperintensity is seen in both hemispheres. No intracranial mass effect. No midline shift or extra-axial fluid collection. No cerebellar tonsillar ectopia. The central arterial and venous flow voids are patent. VENTRICLES: No hydrocephalus. ORBITS: The orbits are normal. SINUSES AND MASTOIDS: The sinuses and mastoid air cells are clear. BONES: No acute fracture or focal osseous lesion. IMPRESSION: 1. Acute right basal ganglia infarct with subtle additional scattered cortical infarcts in the right MCA territory. Cytotoxic edema, but no hemorrhagic transformation or mass effect. 2. Possible chronic microhemorrhage in the central pons. Subtle chronic cortical encephalomalacia suspected in the right parietal lobe. And moderate for age chronic white matter changes otherwise. Electronically signed by: Helayne Hurst MD 02/05/2024 04:19 AM EDT RP Workstation: [id number]   CT HEAD WO CONTRAST ( ) Result Date: 02/04/2024 EXAM: CT HEAD WITHOUT CONTRAST 02/04/2024 10:06:26 PM TECHNIQUE: CT of the head was performed without the administration of intravenous contrast. Automated exposure control, iterative reconstruction, and/or weight based adjustment of the mA/kV was utilized to reduce the radiation dose to as low as reasonably achievable. COMPARISON: None available. CLINICAL HISTORY: Neuro deficit, acute, stroke suspected. FINDINGS: BRAIN AND VENTRICLES: No  acute hemorrhage. No evidence of  acute infarct. No hydrocephalus. No extra-axial collection. No mass effect or midline shift. ORBITS: No acute abnormality. SINUSES: No acute abnormality. SOFT TISSUES AND SKULL: No acute soft tissue abnormality. No skull fracture. IMPRESSION: 1. No evidence of acute intracranial abnormality. Electronically signed by: Gilmore Molt MD 02/04/2024 10:36 PM EDT RP Workstation: [id number]   CT ANGIO HEAD NECK W WO CM (CODE STROKE) Result Date: 02/04/2024 EXAM: CTA HEAD AND NECK WITH AND WITHOUT 02/04/2024 03:06:21 PM TECHNIQUE: CTA of the head and neck was performed with and without the administration of 100 mL of iohexol  (OMNIPAQUE ) 350 MG/ML injection. Multiplanar 2D and/or 3D reformatted images are provided for review. Automated exposure control, iterative reconstruction, and/or weight based adjustment of the mA/kV was utilized to reduce the radiation dose to as low as reasonably achievable. Stenosis of the internal carotid arteries measured using NASCET criteria. COMPARISON: CT head without contrast 02/04/2024 CLINICAL HISTORY: Neuro deficit, acute, stroke suspected. FINDINGS: CTA NECK: AORTIC ARCH AND ARCH VESSELS: No dissection or arterial injury. No significant stenosis of the brachiocephalic or subclavian arteries. CERVICAL CAROTID ARTERIES: No dissection, arterial injury, or hemodynamically significant stenosis by NASCET criteria. CERVICAL VERTEBRAL ARTERIES: No dissection, arterial injury, or significant stenosis. LUNGS AND MEDIASTINUM: Unremarkable. SOFT TISSUES: No acute abnormality. BONES: No acute abnormality. CTA HEAD: ANTERIOR CIRCULATION: No significant stenosis of the internal carotid arteries. No significant stenosis of the anterior cerebral arteries. Proximal right M2 occlusion is present just distal to inferior temporal branches. More distal posterolateral branches are filled by pial collateral flow. The occlusion impacts much of the right MCA distribution. The  left middle cerebral artery is patent proximally without significant stenosis. No aneurysm. POSTERIOR CIRCULATION: No significant stenosis of the posterior cerebral arteries. No significant stenosis of the basilar artery. No significant stenosis of the vertebral arteries. No aneurysm. OTHER: No dural venous sinus thrombosis on this non-dedicated study. Findings were communicated to doctor Aisha Seals by voice and text message at 3:05 PM. IMPRESSION: 1. Proximal right M2 occlusion just distal to inferior temporal branches, impacting much of the right MCA distribution. 2. More distal posterolateral branches fill via collateral flow. 3. Critical results communicated to Dr. Aisha Seals at 3:05 PM. Electronically signed by: Lonni Necessary MD 02/04/2024 03:19 PM EDT RP Workstation: [id number]   CT HEAD CODE STROKE WO CONTRAST (LKW 0-4.5h, LVO 0-24h) Result Date: 02/04/2024 EXAM: CT HEAD WITHOUT CONTRAST 02/04/2024 02:46:19 PM TECHNIQUE: CT of the head was performed without the administration of intravenous contrast. Automated exposure control, iterative reconstruction, and/or weight based adjustment of the mA/kV was utilized to reduce the radiation dose to as low as reasonably achievable. COMPARISON: None available. CLINICAL HISTORY: Neuro deficit, acute, stroke suspected. Left sided weakness. FINDINGS: BRAIN AND VENTRICLES: No acute hemorrhage. No evidence of acute infarct. No hydrocephalus. No extra-axial collection. No mass effect or midline shift. [place] stroke program early CT (ASPECT) score: Ganglionic (caudate, IC, lentiform nucleus, insula, M1-M3): 7 Supraganglionic (M4-M6): 3 Total: 10 ORBITS: No acute abnormality. SINUSES: No acute abnormality. SOFT TISSUES AND SKULL: No acute soft tissue abnormality. No skull fracture. Kirkpatrick 252 pm IMPRESSION: 1. No acute intracranial abnormality.  ASPECTS 10/10 The pertinent results were texted to Dr. Seals via the [facility] system at 2:54 pm.  Electronically signed by: Lonni Necessary MD 02/04/2024 02:55 PM EDT RP Workstation: [id number]     PHYSICAL EXAM  Temp:  [97.5 F (36.4 C)-98.9 F (37.2 C)] 98.4 F (36.9 C) (10/13 0724) Pulse Rate:  [73-101] 90 (10/13 0724) Resp:  [17-28] 17 (10/13 0724)  BP: (119-143)/(68-90) 122/86 (10/13 0724) SpO2:  [92 %-100 %] 100 % (10/13 0724)  General - Well nourished, well developed, in no apparent distress.  Ophthalmologic - fundi not visualized due to noncooperation.  Cardiovascular - Regular rhythm and rate.  Mental Status -  Level of arousal and orientation to time, place, and person were intact. Language including expression, naming, repetition, comprehension was assessed and found intact. Fund of Knowledge was assessed and was intact.  Cranial Nerves II - XII - II - Visual field intact OU. III, IV, VI - Extraocular movements intact. V - Facial sensation intact bilaterally. VII - Facial movement intact bilaterally. VIII - Hearing & vestibular intact bilaterally. X - Palate elevates symmetrically. XI - Chin turning & shoulder shrug intact bilaterally XII - Tongue protrusion intact.  Motor Strength - The patient's strength was normal in all extremities and pronator drift was absent.  Bulk was normal and fasciculations were absent.   Motor Tone - Muscle tone was assessed at the neck and appendages and was normal.  Reflexes - The patient's reflexes were symmetrical in all extremities and she had no pathological reflexes.  Sensory - Light touch, temperature/pinprick were assessed and were symmetrical.    Coordination - The patient had normal movements in the hands and feet with no ataxia or dysmetria.  Tremor was absent.  Gait and Station - deferred.   ASSESSMENT/PLAN [name] is a 38 y.o. female with history of hypertension and heavy smoker admitted for left-sided weakness, headache. TNK given.    Stroke:  right MCA infarcts, embolic pattern secondary to  unclear source, risk factor including heavy smoker and THC use CT no acute abnormality CT head and neck right M2 occlusion Status post IR with right M1 occlusion TICI3 reperfusion CT status post IR no hemorrhage MRI   Acute right basal ganglia infarct with subtle additional scattered cortical infarcts in the right MCA territory. MRA  Revascularized right MCA trifurcation and substantially improved right MCA branch appearance. 2D Echo EF 60-65% TEE 10/14  TCD bubble study Negative  LE venous Doppler No DVT Hypercoagulable workup neg, some are still pending LDL 124 HgbA1c 5.2 UDS positive for THC Lovenox  for VTE prophylaxis No antithrombotic prior to admission, now on aspirin 81 mg daily and clopidogrel 75 mg daily DAPT for 3 weeks and then ASA alone Patient counseled to be compliant with her antithrombotic medications Ongoing aggressive stroke risk factor management Therapy recommendations:  none Disposition:  pending  Hypertension Stable Home BP monitoring Long term BP goal normotensive  Hyperlipidemia Home meds: None LDL 124, goal < 70 Now on Lipitor 40 Continue statin at discharge Educated on avoid pregnancy while on statin  Tobacco abuse Current heavy smoker Smoking cessation counseling provided Nicotine patch provided Pt is willing to quit  Other Stroke Risk Factors Substance abuse with THC, cessation education provided. Pt is willing to quit  Other Active Problems Mild leukocytosis, WBC 12.5-10.9-14.2-12.8 - 11.5 Mom reported tremendous stress at home with depressed mood  Hypokalemia, Replaced  Hospital day # 3  Patient seen and examined by NP/APP with MD. MD to update note as needed.   Jorene Last, DNP, FNP-BC [facility] Pager: [phone]  ATTENDING NOTE: I reviewed above note and agree with the assessment and plan.   No acute event overnight. Pt neuro intact, today is her birthday and she would like to go home. However, still pending  TEE in am. Continue DAPT and statin.   For detailed assessment and plan,  please refer to above as I have made changes wherever appropriate.   Ary Cummins, MD PhD Stroke Neurology 02/07/2024 6:18 PM    To contact Stroke Continuity provider, please refer to [email or web address]. After hours, contact General Neurology

## 2024-02-07 NOTE — Evaluation (Signed)
 Occupational Therapy Evaluation Patient Details Name: Ruth Mullen MRN: [id number] DOB: 07/08/1985 Today's Date: 02/07/2024   History of Present Illness   38 year old female with a history of hypertension who presents with left-sided weakness. CTA was reviewed and she was found to have a proximal M2 occlusion. +TNKase and thrombectomy.     Clinical Impressions Pt received in supine, agreeable for OT visit. Supportive grandmother at bedside. PTA, pt was working two jobs and was fully independent. Lives with her two children (41 y/o and 66 y/o). Today, she presents with mild proximal>distal LUE strength deficits and mild impairments related to divided/alternating attention and recall. Provided pt with compensatory strategies of which she was grateful. Overall, functioning at mod I level for ADLs and ambulation without AD. Denied dizziness nor vision changes throughout.  Pt is functioning near her baseline, no further acute nor post-acute OT needs at this time. Acute OT to sign-off.     If plan is discharge home, recommend the following:    (initial assist for medication and financial mgmt)     Functional Status Assessment         Equipment Recommendations   None recommended by OT     Recommendations for Other Services         Precautions/Restrictions   Precautions Precautions: Other (comment) Precaution/Restrictions Comments: SBP <160 Restrictions Weight Bearing Restrictions Per Provider Order: No     Mobility Bed Mobility Overal bed mobility: Independent                  Transfers Overall transfer level: Independent Equipment used: None Transfers: Sit to/from Stand Sit to Stand: Independent           General transfer comment: Ambulatory to rehab gym while gazing in all directions to simulate grocery shopping/scanning for medications at her work without dizziness; performed tub transfer without LOB/difficulty      Balance Overall  balance assessment: Independent                                         ADL either performed or assessed with clinical judgement   ADL Overall ADL's : Modified independent                                       General ADL Comments: pt performed LB and UB ADLs at mod I level     Vision Baseline Vision/History: 0 No visual deficits Ability to See in Adequate Light: 0 Adequate Patient Visual Report: No change from baseline Vision Assessment?: No apparent visual deficits     Perception Perception: Within Functional Limits       Praxis         Pertinent Vitals/Pain Pain Assessment Pain Assessment: No/denies pain     Extremity/Trunk Assessment Upper Extremity Assessment Upper Extremity Assessment: Right hand dominant;LUE deficits/detail LUE Deficits / Details: shoulder flexion ~4-/5, otherwise grossly WFL LUE Sensation: WNL LUE Coordination: WNL (RAM and FTN intact)   Lower Extremity Assessment Lower Extremity Assessment: Overall WFL for tasks assessed   Cervical / Trunk Assessment Cervical / Trunk Assessment: Normal   Communication Communication Communication: No apparent difficulties   Cognition Arousal: Alert Behavior During Therapy: WFL for tasks assessed/performed Cognition: Cognition impaired     Awareness: Intellectual awareness intact, Online awareness intact Memory impairment (select all impairments):  (  forgetful) Attention impairment (select first level of impairment): Alternating attention   OT - Cognition Comments: mild impairments in recall and attention                 Following commands: Intact       Cueing  General Comments   Cueing Techniques: Verbal cues  supportive grandmother present throughout session   Exercises     Shoulder Instructions      Home Living Family/patient expects to be discharged to:: Private residence Living Arrangements: Children Available Help at Discharge:  Family;Available 24 hours/day Type of Home: Mobile home Home Access: Stairs to enter;Ramped entrance Entrance Stairs-Number of Steps: 3 Entrance Stairs-Rails: Right;Left;Can reach both Home Layout: One level     Bathroom Shower/Tub: Tub/shower unit;Walk-in shower   Bathroom Toilet: Standard     Home Equipment: None   Additional Comments: pt anticipates discharging to her grandmother's house      Prior Functioning/Environment Prior Level of Function : Independent/Modified Independent             Mobility Comments: no AD PTA ADLs Comments: indep; working full time for [employer] (remote work) and working part time at [employer] as a Therapist, occupational Problem List: Decreased strength;Decreased cognition   OT Treatment/Interventions:        OT Goals(Current goals can be found in the care plan section)   Acute Rehab OT Goals Patient Stated Goal: to get better   OT Frequency:       Co-evaluation              AM-PAC OT 6 Clicks Daily Activity     Outcome Measure Help from another person eating meals?: None Help from another person taking care of personal grooming?: None Help from another person toileting, which includes using toliet, bedpan, or urinal?: None Help from another person bathing (including washing, rinsing, drying)?: None Help from another person to put on and taking off regular upper body clothing?: None Help from another person to put on and taking off regular lower body clothing?: None 6 Click Score: 24   End of Session Nurse Communication: Other (comment) (pt status)  Activity Tolerance: Patient tolerated treatment well Patient left: in bed;with call bell/phone within reach;with family/visitor present  OT Visit Diagnosis: Unsteadiness on feet (R26.81);Dizziness and giddiness (R42)                Time: 9066-9046 OT Time Calculation (min): 20 min Charges:  OT General Charges $OT Visit: 1 Visit OT Evaluation $OT Eval Low Complexity: 1  Low  Rikki CORDOBA MSOT, OTR/L Acute Rehabilitation Services [phone] Secure Chat Preferred  Rikki Milch 02/07/2024, 10:49 AM

## 2024-02-07 NOTE — Progress Notes (Signed)
   [facility] has been requested to perform a transesophageal echocardiogram on Ruth Mullen for CVA.     The patient does NOT have any absolute or relative contraindications to a Transesophageal Echocardiogram (TEE).  The patient has: No other conditions that may impact this procedure.    After careful review of history and examination, the risks and benefits of transesophageal echocardiogram have been explained including risks of esophageal damage, perforation (1:10,000 risk), bleeding, pharyngeal hematoma as well as other potential complications associated with conscious sedation including aspiration, arrhythmia, respiratory failure and death. Alternatives to treatment were discussed, questions were answered. Patient is willing to proceed.   Signed, KARALINE BURESH, PA-C  02/07/2024 4:53 PM

## 2024-02-07 NOTE — TOC Initial Note (Signed)
 Transition of Care [facility]) - Initial/Assessment Note    Patient Details  Name: Ruth Mullen MRN: [id number] Date of Birth: [birth year]  Transition of Care [facility]) CM/SW Contact:    Andrez JULIANNA George, RN Phone Number: 02/07/2024, 11:08 AM  Clinical Narrative:                 Ruth Mullen is a 38 y.o. female with hx of hypertension presenting with left-sided weakness.   Pt is from home with her grandmother and kids.  Her mother is able to assist.  No DME.  Pt manages her own medications.  She denies issues with transportation. No follow up per therapy.   Pt states she doesn't have a PcP. Appt placed on AVS.   Plan is for TEE tomorrow. IP Care management following.  Expected Discharge Plan: Home/Self Care Barriers to Discharge: Continued Medical Work up   Patient Goals and CMS Choice            Expected Discharge Plan and Services   Discharge Planning Services: CM Consult   Living arrangements for the past 2 months: Single Family Home                                      Prior Living Arrangements/Services Living arrangements for the past 2 months: Single Family Home Lives with:: Other (Comment), Parents (grandmom and mother) Patient language and need for interpreter reviewed:: Yes Do you feel safe going back to the place where you live?: Yes        Care giver support system in place?: Yes (comment)   Criminal Activity/Legal Involvement Pertinent to Current Situation/Hospitalization: No - Comment as needed  Activities of Daily Living      Permission Sought/Granted                  Emotional Assessment Appearance:: Appears stated age Attitude/Demeanor/Rapport: Engaged Affect (typically observed): Accepting Orientation: : Oriented to Self, Oriented to Place, Oriented to  Time, Oriented to Situation   Psych Involvement: No (comment)  Admission diagnosis:  Stroke [facility]) [I63.9] Acute CVA (cerebrovascular accident) (HCC) [I63.9] Altered  mental status, unspecified altered mental status type [R41.82] Patient Active Problem List   Diagnosis Date Noted   Stroke (HCC) 02/04/2024   Transaminitis 10/30/2018   Encounter for initial prescription of contraceptive pills 02/01/2017   Menorrhagia with regular cycle 11/19/2015   Dysmenorrhea 11/19/2015   Depression 12/06/2014   Fatigue 06/28/2014   Abnormal uterine bleeding (AUB) 01/15/2014   Vaginal discharge 09/07/2013   BV (bacterial vaginosis) 09/07/2013   H/O cervical incompetence 07/18/2013   Mild hypertension 07/18/2013   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 Pharm[facility]:   [id number] - [place], [place] - [address] AT SEC [address] [place] [address] Phone: [phone] Fax: [phone]  [facility] [phone] - [place], [place] - [address] AT [facility] [address] & [address]  [place] [address] Phone: [phone] Fax: [phone]     Social Drivers of Health (SDOH) Social History: SDOH Screenings   Food Insecurity: No Food Insecurity (02/06/2024)  Housing: Low Risk  (02/06/2024)  Transportation Needs: No Transportation Needs (02/06/2024)  Utilities: Not At Risk (02/06/2024)  Tobacco Use: High Risk (02/04/2024)   SDOH Interventions:  Readmission Risk Interventions     No data to display

## 2024-02-07 NOTE — Progress Notes (Signed)
 Obtained consent form for TEE and placed it in pt's chart.   Amado GORMAN Arabia, RN

## 2024-02-08 ENCOUNTER — Inpatient Hospital Stay (HOSPITAL_COMMUNITY)

## 2024-02-08 ENCOUNTER — Encounter: Payer: Self-pay | Admitting: Nurse Practitioner

## 2024-02-08 ENCOUNTER — Encounter (HOSPITAL_COMMUNITY)
Admission: EM | Disposition: A | Discharge status: Home / Self Care | Payer: Self-pay | Source: Home / Self Care | Attending: Neurology

## 2024-02-08 ENCOUNTER — Other Ambulatory Visit (HOSPITAL_COMMUNITY): Payer: Self-pay

## 2024-02-08 DIAGNOSIS — I63411 Cerebral infarction due to embolism of right middle cerebral artery: Secondary | ICD-10-CM | POA: Diagnosis not present

## 2024-02-08 DIAGNOSIS — I639 Cerebral infarction, unspecified: Secondary | ICD-10-CM

## 2024-02-08 DIAGNOSIS — F418 Other specified anxiety disorders: Secondary | ICD-10-CM

## 2024-02-08 DIAGNOSIS — R29700 NIHSS score 0: Secondary | ICD-10-CM | POA: Diagnosis not present

## 2024-02-08 DIAGNOSIS — I10 Essential (primary) hypertension: Secondary | ICD-10-CM

## 2024-02-08 DIAGNOSIS — F17210 Nicotine dependence, cigarettes, uncomplicated: Secondary | ICD-10-CM

## 2024-02-08 HISTORY — PX: TRANSESOPHAGEAL ECHOCARDIOGRAM (CATH LAB): EP1270

## 2024-02-08 LAB — BETA-2-GLYCOPROTEIN I ABS, IGG/M/A
Beta-2 Glyco I IgG: <9 GPI IgG units (ref 0–20)
Beta-2-Glycoprotein I IgA: <9 GPI IgA units (ref 0–25)
Beta-2-Glycoprotein I IgM: <9 GPI IgM units (ref 0–32)

## 2024-02-08 LAB — CBC
HCT: 36 % (ref 36.0–46.0)
Hemoglobin: 12.1 g/dL (ref 12.0–15.0)
MCH: 25.8 pg — ABNORMAL LOW (ref 26.0–34.0)
MCHC: 33.6 g/dL (ref 30.0–36.0)
MCV: 76.8 fL — ABNORMAL LOW (ref 80.0–100.0)
Platelets: 390 K/uL (ref 150–400)
RBC: 4.69 MIL/uL (ref 3.87–5.11)
RDW: 14.9 % (ref 11.5–15.5)
WBC: 10.7 K/uL — ABNORMAL HIGH (ref 4.0–10.5)
nRBC: 0 % (ref 0.0–0.2)

## 2024-02-08 LAB — BASIC METABOLIC PANEL WITH GFR
Anion gap: 12 (ref 5–15)
BUN: 7 mg/dL (ref 6–20)
CO2: 22 mmol/L (ref 22–32)
Calcium: 8.6 mg/dL — ABNORMAL LOW (ref 8.9–10.3)
Chloride: 104 mmol/L (ref 98–111)
Creatinine, Ser: 0.65 mg/dL (ref 0.44–1.00)
GFR, Estimated: >60 mL/min (ref >60)
Glucose, Bld: 100 mg/dL — ABNORMAL HIGH (ref 70–99)
Potassium: 3.8 mmol/L (ref 3.5–5.1)
Sodium: 138 mmol/L (ref 135–145)

## 2024-02-08 LAB — PROTEIN C, TOTAL: Protein C, Total: 125 % (ref 60–150)

## 2024-02-08 LAB — CARDIOLIPIN ANTIBODIES, IGG, IGM, IGA
Anticardiolipin IgA: <9 U/mL (ref 0–11)
Anticardiolipin IgG: <9 GPL U/mL (ref 0–14)
Anticardiolipin IgM: <9 [MPL'U]/mL (ref 0–12)

## 2024-02-08 LAB — MAGNESIUM: Magnesium: 2 mg/dL (ref 1.7–2.4)

## 2024-02-08 LAB — ECHO TEE

## 2024-02-08 SURGERY — TRANSESOPHAGEAL ECHOCARDIOGRAM (TEE) (CATHLAB)
Anesthesia: Monitor Anesthesia Care

## 2024-02-08 MED ORDER — MIDAZOLAM HCL 2 MG/2ML IJ SOLN
INTRAMUSCULAR | Status: DC | PRN
  Administered 2024-02-08: 2 mg via INTRAVENOUS

## 2024-02-08 MED ORDER — ASPIRIN 81 MG PO TBEC
81.0000 mg | DELAYED_RELEASE_TABLET | Freq: Every day | ORAL | 12 refills | Status: AC
  Filled 2024-02-08: qty 30, 30d supply, fill #0

## 2024-02-08 MED ORDER — LIDOCAINE 2% (20 MG/ML) 5 ML SYRINGE
INTRAMUSCULAR | Status: DC | PRN
  Administered 2024-02-08: 50 mg via INTRAVENOUS

## 2024-02-08 MED ORDER — CLOPIDOGREL BISULFATE 75 MG PO TABS
75.0000 mg | ORAL_TABLET | Freq: Every day | ORAL | 0 refills | Status: AC
  Filled 2024-02-08: qty 21, 21d supply, fill #0

## 2024-02-08 MED ORDER — MIDAZOLAM HCL 2 MG/2ML IJ SOLN
INTRAMUSCULAR | Status: AC
  Filled 2024-02-08: qty 2

## 2024-02-08 MED ORDER — SODIUM CHLORIDE 0.9 % IV SOLN: INTRAVENOUS | Status: DC

## 2024-02-08 MED ORDER — NICOTINE 14 MG/24HR TD PT24
14.0000 mg | MEDICATED_PATCH | Freq: Every day | TRANSDERMAL | 0 refills | Status: DC
  Filled 2024-02-08: qty 28, 28d supply, fill #0

## 2024-02-08 MED ORDER — PROPOFOL 10 MG/ML IV BOLUS
INTRAVENOUS | Status: DC | PRN
  Administered 2024-02-08 (×2): 50 mg via INTRAVENOUS
  Administered 2024-02-08: 20 mg via INTRAVENOUS
  Administered 2024-02-08: 50 mg via INTRAVENOUS

## 2024-02-08 MED ORDER — ATORVASTATIN CALCIUM 40 MG PO TABS
40.0000 mg | ORAL_TABLET | Freq: Every day | ORAL | 2 refills | Status: AC
  Filled 2024-02-08: qty 30, 30d supply, fill #0

## 2024-02-08 NOTE — Anesthesia Preprocedure Evaluation (Addendum)
 Anesthesia Evaluation  Patient identified by MRN, date of birth, ID band Patient awake    Reviewed: Allergy & Precautions, H&P , NPO status , Patient's Chart, lab work & pertinent test resultsPreop documentation limited or incomplete due to emergent nature of procedure.  Airway Mallampati: II  TM Distance: >3 FB Neck ROM: Full    Dental  (+) Dental Advisory Given   Pulmonary neg pulmonary ROS, Current SmokerPatient did not abstain from smoking.   Pulmonary exam normal breath sounds clear to auscultation       Cardiovascular hypertension, negative cardio ROS  Rhythm:Regular Rate:Normal     Neuro/Psych  PSYCHIATRIC DISORDERS Anxiety Depression    negative neurological ROS  negative psych ROS   GI/Hepatic negative GI ROS, Neg liver ROS,,,(+)     substance abuse  marijuana use  Endo/Other  negative endocrine ROS    Renal/GU negative Renal ROS  negative genitourinary   Musculoskeletal negative musculoskeletal ROS (+)    Abdominal   Peds negative pediatric ROS (+)  Hematology negative hematology ROS (+)   Anesthesia Other Findings Acute stroke  Reproductive/Obstetrics negative OB ROS                              Anesthesia Physical Anesthesia Plan  ASA: 2  Anesthesia Plan: MAC   Post-op Pain Management: Minimal or no pain anticipated   Induction: Intravenous  PONV Risk Score and Plan: 1 and Treatment may vary due to age or medical condition and Propofol infusion  Airway Management Planned: Nasal Cannula  Additional Equipment:   Intra-op Plan:   Post-operative Plan:   Informed Consent: I have reviewed the patients History and Physical, chart, labs and discussed the procedure including the risks, benefits and alternatives for the proposed anesthesia with the patient or authorized representative who has indicated his/her understanding and acceptance.     Dental advisory  given  Plan Discussed with: CRNA  Anesthesia Plan Comments:          Anesthesia Quick Evaluation

## 2024-02-08 NOTE — TOC CM/SW Note (Signed)
 02-08-2024  JAMIE M.  @  [facility] :  CONTACT FOR D/C PLANNING NEED AND HELP WITH  DME and H H . PHONE # [phone].

## 2024-02-08 NOTE — Plan of Care (Signed)
 Problem: Education: Goal: Knowledge of General Education information will improve Description: Including pain rating scale, medication(s)/side effects and non-pharmacologic comfort measures Outcome: Completed/Met   Problem: Health Behavior/Discharge Planning: Goal: Ability to manage health-related needs will improve Outcome: Completed/Met   Problem: Clinical Measurements: Goal: Ability to maintain clinical measurements within normal limits will improve Outcome: Completed/Met Goal: Will remain free from infection Outcome: Completed/Met Goal: Diagnostic test results will improve Outcome: Completed/Met Goal: Respiratory complications will improve Outcome: Completed/Met Goal: Cardiovascular complication will be avoided Outcome: Completed/Met   Problem: Activity: Goal: Risk for activity intolerance will decrease Outcome: Completed/Met   Problem: Nutrition: Goal: Adequate nutrition will be maintained Outcome: Completed/Met   Problem: Coping: Goal: Level of anxiety will decrease Outcome: Completed/Met   Problem: Elimination: Goal: Will not experience complications related to bowel motility Outcome: Completed/Met Goal: Will not experience complications related to urinary retention Outcome: Completed/Met   Problem: Pain Managment: Goal: General experience of comfort will improve and/or be controlled Outcome: Completed/Met   Problem: Safety: Goal: Ability to remain free from injury will improve Outcome: Completed/Met   Problem: Skin Integrity: Goal: Risk for impaired skin integrity will decrease Outcome: Completed/Met   Problem: Education: Goal: Knowledge of disease or condition will improve Outcome: Completed/Met Goal: Knowledge of secondary prevention will improve (MUST DOCUMENT ALL) Outcome: Completed/Met Goal: Knowledge of patient specific risk factors will improve (DELETE if not current risk factor) Outcome: Completed/Met   Problem: Ischemic Stroke/TIA Tissue  Perfusion: Goal: Complications of ischemic stroke/TIA will be minimized Outcome: Completed/Met   Problem: Coping: Goal: Will verbalize positive feelings about self Outcome: Completed/Met Goal: Will identify appropriate support needs Outcome: Completed/Met   Problem: Health Behavior/Discharge Planning: Goal: Ability to manage health-related needs will improve Outcome: Completed/Met Goal: Goals will be collaboratively established with patient/family Outcome: Completed/Met   Problem: Self-Care: Goal: Ability to participate in self-care as condition permits will improve Outcome: Completed/Met Goal: Verbalization of feelings and concerns over difficulty with self-care will improve Outcome: Completed/Met Goal: Ability to communicate needs accurately will improve Outcome: Completed/Met   Problem: Nutrition: Goal: Risk of aspiration will decrease Outcome: Completed/Met Goal: Dietary intake will improve Outcome: Completed/Met   Problem: Education: Goal: Understanding of CV disease, CV risk reduction, and recovery process will improve Outcome: Completed/Met Goal: Individualized Educational Video(s) Outcome: Completed/Met   Problem: Activity: Goal: Ability to return to baseline activity level will improve Outcome: Completed/Met   Problem: Cardiovascular: Goal: Ability to achieve and maintain adequate cardiovascular perfusion will improve Outcome: Completed/Met Goal: Vascular access site(s) Level 0-1 will be maintained Outcome: Completed/Met   Problem: Health Behavior/Discharge Planning: Goal: Ability to safely manage health-related needs after discharge will improve Outcome: Completed/Met   Problem: Education: Goal: Knowledge of disease or condition will improve Outcome: Completed/Met Goal: Knowledge of secondary prevention will improve (MUST DOCUMENT ALL) Outcome: Completed/Met Goal: Knowledge of patient specific risk factors will improve (DELETE if not current risk  factor) Outcome: Completed/Met   Problem: Intracerebral Hemorrhage Tissue Perfusion: Goal: Complications of Intracerebral Hemorrhage will be minimized Outcome: Completed/Met   Problem: Coping: Goal: Will verbalize positive feelings about self Outcome: Completed/Met Goal: Will identify appropriate support needs Outcome: Completed/Met   Problem: Health Behavior/Discharge Planning: Goal: Ability to manage health-related needs will improve Outcome: Completed/Met Goal: Goals will be collaboratively established with patient/family Outcome: Completed/Met   Problem: Self-Care: Goal: Ability to participate in self-care as condition permits will improve Outcome: Completed/Met Goal: Verbalization of feelings and concerns over difficulty with self-care will improve Outcome: Completed/Met Goal: Ability to communicate needs accurately will  improve Outcome: Completed/Met   Problem: Nutrition: Goal: Risk of aspiration will decrease Outcome: Completed/Met Goal: Dietary intake will improve Outcome: Completed/Met

## 2024-02-08 NOTE — Progress Notes (Signed)
  Echocardiogram Echocardiogram Transesophageal has been performed.  Ruth Mullen, RDCS 02/08/2024, 8:30 AM

## 2024-02-08 NOTE — Discharge Summary (Addendum)
 Stroke Discharge Summary  Patient ID: Ruth Mullen   MRN: [id number]      DOB: [birth year]  Date of Admission: 02/04/2024 Date of Discharge: 02/08/2024  Attending Physician:  Stroke, Md, MD Consultant(s):    None  Patient's PCP:  [facility]  DISCHARGE PRIMARY DIAGNOSIS:  Stroke: right MCA infarcts, embolic pattern secondary to unclear source, risk factor including heavy smoker and THC use   Patient Active Problem List   Diagnosis Date Noted   Stroke (HCC) 02/04/2024   Transaminitis 10/30/2018   Encounter for initial prescription of contraceptive pills 02/01/2017   Menorrhagia with regular cycle 11/19/2015   Dysmenorrhea 11/19/2015   Depression 12/06/2014   Fatigue 06/28/2014   Abnormal uterine bleeding (AUB) 01/15/2014   Vaginal discharge 09/07/2013   BV (bacterial vaginosis) 09/07/2013   H/O cervical incompetence 07/18/2013   Mild hypertension 07/18/2013   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 Swallow whole. Start taking on: February 09, 2024   atorvastatin 40 MG tablet Commonly known as: LIPITOR Take 1 tablet (40 mg total) by mouth daily. Start taking on: February 09, 2024   clopidogrel 75 MG tablet Commonly known as: PLAVIX Take 1 tablet (75 mg total) by mouth daily. Start taking on: February 09, 2024   nicotine 14 mg/24hr patch Commonly known as: NICODERM CQ - dosed in mg/24 hours Place 1 patch (14 mg total) onto the skin daily. Start taking on: February 09, 2024        LABORATORY  STUDIES CBC    Component Value Date/Time   WBC 10.7 (H) 02/08/2024 0137   RBC 4.69 02/08/2024 0137   HGB 12.1 02/08/2024 0137   HGB 12.7 06/20/2020 1156   HCT 36.0 02/08/2024 0137   HCT 38.6 06/20/2020 1156   PLT 390 02/08/2024 0137   PLT 384 06/20/2020 1156   MCV 76.8 (L) 02/08/2024 0137   MCV 79 06/20/2020 1156   MCH 25.8 (L) 02/08/2024 0137   MCHC 33.6 02/08/2024 0137   RDW 14.9 02/08/2024 0137   RDW 14.3 06/20/2020 1156   LYMPHSABS 5.1 (H) 02/04/2024 1441   LYMPHSABS 3.5 (H) 06/20/2020 1156   MONOABS 0.5 02/04/2024 1441   EOSABS 0.1 02/04/2024 1441   EOSABS 0.1 06/20/2020 1156   BASOSABS 0.1 02/04/2024 1441   BASOSABS 0.0 06/20/2020 1156   CMP    Component Value Date/Time   NA 138 02/08/2024 0137   NA 143 06/20/2020 1156   K 3.8 02/08/2024 0137   CL 104 02/08/2024 0137   CO2 22 02/08/2024 0137   GLUCOSE 100 (H) 02/08/2024 0137   BUN 7 02/08/2024 0137   BUN 6 06/20/2020 1156   CREATININE 0.65 02/08/2024 0137   CREATININE 0.55 07/18/2013 1210   CALCIUM 8.6 (L) 02/08/2024 0137   PROT 7.9 02/04/2024 1441   PROT 7.2 06/20/2020 1156   ALBUMIN 4.5 02/04/2024 1441   ALBUMIN 4.7 06/20/2020 1156   AST 28  02/04/2024 1441   ALT 17 02/04/2024 1441   ALKPHOS 74 02/04/2024 1441   BILITOT 0.3 02/04/2024 1441   BILITOT 0.7 06/20/2020 1156   GFRNONAA >60 02/08/2024 0137   GFRAA 144 06/20/2020 1156   COAGS Lab Results  Component Value Date   INR 1.0 02/04/2024   INR 1.2 11/01/2018   INR 1.2 10/30/2018   Lipid Panel    Component Value Date/Time   CHOL 182 02/05/2024 0614   TRIG 42 02/05/2024 0614   HDL 50 02/05/2024 0614   CHOLHDL 3.6 02/05/2024 0614   VLDL 8 02/05/2024 0614   LDLCALC 124 (H) 02/05/2024 0614   HgbA1C  Lab Results  Component Value Date   HGBA1C 5.2 02/05/2024   Alcohol Level    Component Value Date/Time   [facility] <15 02/04/2024 1441     SIGNIFICANT DIAGNOSTIC STUDIES ECHO TEE Result Date: 02/08/2024    TRANSESOPHOGEAL ECHO REPORT    Patient Name:   Ruth Mullen Date of Exam: 02/08/2024 Medical Rec #:  [id number]        Height:       60.0 in Accession #:    [id number]       Weight:       130.0 lb Date of Birth:  Feb 14, 1986       BSA:          1.554 m Patient Age:    38 years         BP:           126/69 mmHg Patient Gender: F                HR:           115 bpm. Exam Location:  Inpatient Procedure: Transesophageal Echo, Limited Color Doppler, Cardiac Doppler and            Saline Contrast Bubble Study (Both Spectral and Color Flow Doppler            were utilized during procedure). Indications:     CVA, PFO evaluation  History:         Patient has prior history of Echocardiogram examinations, most                  recent 02/06/2024. Stroke; Risk Factors:Hypertension and                  Current Smoker.  Sonographer:     Koleen Popper RDCS Referring Phys:  [id number] LEONTINE LOISE LOCKWOOD Diagnosing Phys: Vinie Maxcy MD PROCEDURE: After discussion of the risks and benefits of a TEE, an informed consent was obtained from the patient. TEE procedure time was 31 minutes. The transesophogeal probe was passed without difficulty through the esophogus of the patient. Imaged were obtained with the patient in a supine position. Sedation performed by different physician. The patient was monitored while under deep sedation. Anesthestetic sedation was provided intravenously by Anesthesiology: 170mg  of Propofol, 50mg  of Lidocaine . Image quality was good. The patient's vital signs; including heart rate, blood pressure, and oxygen saturation; remained stable throughout the procedure. The patient developed no complications during the procedure.  IMPRESSIONS  1. Left ventricular ejection fraction, by estimation, is 60 to 65%. The left ventricle has normal function. The left ventricle has no regional wall motion abnormalities.  2. Right ventricular systolic function is normal. The right ventricular size is normal.  3. No left atrial/left atrial appendage thrombus  was detected.  4. The mitral valve is grossly normal. Trivial  mitral valve regurgitation.  5. The aortic valve is tricuspid. Aortic valve regurgitation is not visualized. No aortic stenosis is present.  6. Agitated saline contrast bubble study was positive with shunting observed after >6 cardiac cycles suggestive of intrapulmonary shunting. Conclusion(s)/Recommendation(s): No LA/LAA thrombus identified. Negative bubble study for interatrial shunt. No intracardiac source of embolism detected on this on this transesophageal echocardiogram. A small number of left atrial bubbles noted late, suggestive of intrapulmonary shunting - no immediate shunting at the atrial level, even with valsalva. FINDINGS  Left Ventricle: Left ventricular ejection fraction, by estimation, is 60 to 65%. The left ventricle has normal function. The left ventricle has no regional wall motion abnormalities. The left ventricular internal cavity size was normal in size. There is  no left ventricular hypertrophy. Right Ventricle: The right ventricular size is normal. No increase in right ventricular wall thickness. Right ventricular systolic function is normal. Left Atrium: Left atrial size was normal in size. No left atrial/left atrial appendage thrombus was detected. Right Atrium: Right atrial size was normal in size. Pericardium: There is no evidence of pericardial effusion. Mitral Valve: The mitral valve is grossly normal. Trivial mitral valve regurgitation. Tricuspid Valve: The tricuspid valve is grossly normal. Tricuspid valve regurgitation is trivial. Aortic Valve: The aortic valve is tricuspid. Aortic valve regurgitation is not visualized. No aortic stenosis is present. Pulmonic Valve: The pulmonic valve was grossly normal. Pulmonic valve regurgitation is trivial. Aorta: The aortic root and ascending aorta are structurally normal, with no evidence of dilitation. IAS/Shunts: No atrial level shunt detected by color flow Doppler. Agitated  saline contrast was given intravenously to evaluate for intracardiac shunting. Agitated saline contrast bubble study was positive with shunting observed after >6 cardiac cycles suggestive of intrapulmonary shunting. Additional Comments: Spectral Doppler performed. LEFT VENTRICLE PLAX 2D LVOT diam:     2.10 cm LVOT Area:     3.46 cm   AORTA Ao Root diam: 2.70 cm Ao Asc diam:  2.50 cm  SHUNTS Systemic Diam: 2.10 cm Vinie Maxcy MD Electronically signed by Vinie Maxcy MD Signature Date/Time: 02/08/2024/9:05:57 AM    Final    EP STUDY Result Date: 02/08/2024 See surgical note for result.  IR PERCUTANEOUS ART THROMBECTOMY/INFUSION INTRACRANIAL INC DIAG ANGIO Result Date: 02/07/2024 INDICATION: Acute Large Vessel occlusion, right M1 EXAM: Mechanical thrombectomy, CONSENT: Consent was obtained from the patient's son MEDICATIONS: No additional ANESTHESIA/SEDATION: General CONTRAST:  15mL OMNIPAQUE  IOHEXOL  300 MG/ML  SOLN FLUOROSCOPY: Radiation Exposure Index (as provided by the fluoroscopic device): 134 MGy reference air Kerma COMPLICATIONS: None immediate. TECHNIQUE: Maximal Sterile Barrier Technique was utilized including caps, mask, sterile gowns, sterile gloves, sterile drape, hand hygiene and skin antiseptic. A timeout was performed prior to the initiation of the procedure. PROCEDURE: Femoral access was obtained with ultrasound using direct visualization of the needle puncture into the artery. A 90 cm neuron max sheath was placed. The neuron max was advanced over a penumbra select parents teen into the right internal carotid artery. The red 72 SENDIT catheter was advanced over a synchro support to the level of the occlusion and connected to suction. The catheter was then withdrawn under aspiration. This resulted in aspiration of the embolus. The follow-up arteriogram demonstrated normal flow in the internal carotid artery, anterior and middle cerebral artery territories with no branch occlusions. TICI 3.  Hemostasis was obtained with 6 [place] Angio-Seal FINDINGS: Right internal carotid artery first arteriogram: There is an embolus occluding the M1 segment just distal to the anterior temporal artery. The  anterior cerebral artery is normal. There are good pial collaterals. Right internal carotid artery after thrombectomy: The M1 segment and all proximal branches are patent. No distal branch occlusions are present. The catheter is obstructing the flow in the internal carotid artery. Right common carotid artery after thrombectomy: The internal carotid artery, anterior and middle cerebral artery territories are patent with normal flow and no distal branch occlusion. Mild spasm in the internal carotid artery and middle cerebral artery M1 segment. IMPRESSION: Right middle cerebral artery embolus removed with mechanical embolectomy. TICI 3 Electronically Signed   By: Nancyann Burns M.D.   On: 02/07/2024 08:17   IR US  Guide Vasc Access Right Result Date: 02/07/2024 INDICATION: Acute Large Vessel occlusion, right M1 EXAM: Mechanical thrombectomy, CONSENT: Consent was obtained from the patient's son MEDICATIONS: No additional ANESTHESIA/SEDATION: General CONTRAST:  15mL OMNIPAQUE  IOHEXOL  300 MG/ML  SOLN FLUOROSCOPY: Radiation Exposure Index (as provided by the fluoroscopic device): 134 MGy reference air Kerma COMPLICATIONS: None immediate. TECHNIQUE: Maximal Sterile Barrier Technique was utilized including caps, mask, sterile gowns, sterile gloves, sterile drape, hand hygiene and skin antiseptic. A timeout was performed prior to the initiation of the procedure. PROCEDURE: Femoral access was obtained with ultrasound using direct visualization of the needle puncture into the artery. A 90 cm neuron max sheath was placed. The neuron max was advanced over a penumbra select parents teen into the right internal carotid artery. The red 72 SENDIT catheter was advanced over a synchro support to the level of the occlusion and connected  to suction. The catheter was then withdrawn under aspiration. This resulted in aspiration of the embolus. The follow-up arteriogram demonstrated normal flow in the internal carotid artery, anterior and middle cerebral artery territories with no branch occlusions. TICI 3. Hemostasis was obtained with 6 [place] Angio-Seal FINDINGS: Right internal carotid artery first arteriogram: There is an embolus occluding the M1 segment just distal to the anterior temporal artery. The anterior cerebral artery is normal. There are good pial collaterals. Right internal carotid artery after thrombectomy: The M1 segment and all proximal branches are patent. No distal branch occlusions are present. The catheter is obstructing the flow in the internal carotid artery. Right common carotid artery after thrombectomy: The internal carotid artery, anterior and middle cerebral artery territories are patent with normal flow and no distal branch occlusion. Mild spasm in the internal carotid artery and middle cerebral artery M1 segment. IMPRESSION: Right middle cerebral artery embolus removed with mechanical embolectomy. TICI 3 Electronically Signed   By: Nancyann Burns M.D.   On: 02/07/2024 08:17   VAS US  TRANSCRANIAL DOPPLER W BUBBLES Result Date: 02/06/2024  Transcranial Doppler with Bubble Patient Name:  Ruth Mullen  Date of Exam:   02/06/2024 Medical Rec #: [id number]         Accession #:    [id number] Date of Birth: [birth year]        Patient Gender: F Patient Age:   61 years Exam Location:  [facility] : [facility] Procedure:      VAS US  TRANSCRANIAL DOPPLER W BUBBLES Referring Phys: --------------------------------------------------------------------------------  Indications: Stroke. Comparison Study: No prior study on file Performing Technologist: Alberta Lis RVS  Examination Guidelines: A complete evaluation includes B-mode imaging, spectral Doppler, color Doppler, and power Doppler as needed of all accessible portions of each vessel.  Bilateral testing is considered an integral part of a complete examination. Limited examinations for reoccurring indications may be performed as noted.  Summary: No HITS at rest or during Valsalva. Negative transcranial Doppler  Bubble study with no evidence of right to left intracardiac communication.  A vascular evaluation was performed. The right middle cerebral artery was studied. An IV was inserted into the patient's left AC. Verbal informed consent was obtained.  *  Diagnosing physician: Ary Cummins MD Electronically signed by Ary Cummins MD on 02/06/2024 at 8:58:56 PM.    Final    VAS US  LOWER EXTREMITY VENOUS (DVT) Result Date: 02/06/2024  Lower Venous DVT Study Patient Name:  Ruth Mullen  Date of Exam:   02/06/2024 Medical Rec #: [id number]         Accession #:    [id number] Date of Birth: 1985/09/15        Patient Gender: F Patient Age:   49 years Exam Location:  [facility] Procedure:      VAS US  LOWER EXTREMITY VENOUS (DVT) Referring Phys: ARY Jadarian Mckay --------------------------------------------------------------------------------  Indications: Stroke.  Comparison Study: No prior study on file Performing Technologist: Alberta Lis RVS  Examination Guidelines: A complete evaluation includes B-mode imaging, spectral Doppler, color Doppler, and power Doppler as needed of all accessible portions of each vessel. Bilateral testing is considered an integral part of a complete examination. Limited examinations for reoccurring indications may be performed as noted. The reflux portion of the exam is performed with the patient in reverse Trendelenburg.  +---------+---------------+---------+-----------+----------+--------------+ RIGHT    CompressibilityPhasicitySpontaneityPropertiesThrombus Aging +---------+---------------+---------+-----------+----------+--------------+ CFV      Full           Yes      Yes                                  +---------+---------------+---------+-----------+----------+--------------+ SFJ      Full                                                        +---------+---------------+---------+-----------+----------+--------------+ FV Prox  Full           Yes      Yes                                 +---------+---------------+---------+-----------+----------+--------------+ FV Mid   Full                                                        +---------+---------------+---------+-----------+----------+--------------+ FV DistalFull           Yes      Yes                                 +---------+---------------+---------+-----------+----------+--------------+ PFV      Full           Yes      Yes                                 +---------+---------------+---------+-----------+----------+--------------+ POP      Full           Yes  Yes                                 +---------+---------------+---------+-----------+----------+--------------+ PTV      Full                                                        +---------+---------------+---------+-----------+----------+--------------+ PERO     Full                                                        +---------+---------------+---------+-----------+----------+--------------+   +---------+---------------+---------+-----------+----------+--------------+ LEFT     CompressibilityPhasicitySpontaneityPropertiesThrombus Aging +---------+---------------+---------+-----------+----------+--------------+ CFV      Full           Yes      Yes                                 +---------+---------------+---------+-----------+----------+--------------+ SFJ      Full                                                        +---------+---------------+---------+-----------+----------+--------------+ FV Prox  Full                                                         +---------+---------------+---------+-----------+----------+--------------+ FV Mid   Full                                                        +---------+---------------+---------+-----------+----------+--------------+ FV DistalFull                                                        +---------+---------------+---------+-----------+----------+--------------+ PFV      Full                                                        +---------+---------------+---------+-----------+----------+--------------+ POP      Full           Yes      Yes                                 +---------+---------------+---------+-----------+----------+--------------+ PTV      Full                                                        +---------+---------------+---------+-----------+----------+--------------+  PERO     Full                                                        +---------+---------------+---------+-----------+----------+--------------+     Summary: BILATERAL: - No evidence of deep vein thrombosis seen in the lower extremities, bilaterally. -No evidence of popliteal cyst, bilaterally.   *See table(s) above for measurements and observations. Electronically signed by Gaile New MD on 02/06/2024 at 8:45:26 PM.    Final    ECHOCARDIOGRAM COMPLETE Result Date: 02/06/2024    ECHOCARDIOGRAM REPORT   Patient Name:   Ruth Mullen Date of Exam: 02/06/2024 Medical Rec #:  [id number]        Height:       60.0 in Accession #:    [id number]       Weight:       130.0 lb Date of Birth:  Sep 11, 1985       BSA:          1.554 m Patient Age:    37 years         BP:           139/96 mmHg Patient Gender: F                HR:           85 bpm. Exam Location:  Inpatient Procedure: 2D Echo, Cardiac Doppler and Color Doppler (Both Spectral and Color            Flow Doppler were utilized during procedure). Indications:    Stroke I63.9  History:        Patient has no prior history of  Echocardiogram examinations.                 Risk Factors:Current Smoker and Hypertension.  Sonographer:    Jayson Gaskins Referring Phys: [id number] Jhaden Pizzuto IMPRESSIONS  1. Left ventricular ejection fraction, by estimation, is 60 to 65%. The left ventricle has normal function. The left ventricle has no regional wall motion abnormalities. Left ventricular diastolic parameters were normal.  2. Right ventricular systolic function is normal. The right ventricular size is normal. Tricuspid regurgitation signal is inadequate for assessing PA pressure.  3. The mitral valve is normal in structure. Trivial mitral valve regurgitation. No evidence of mitral stenosis.  4. The aortic valve is normal in structure. Aortic valve regurgitation is not visualized. No aortic stenosis is present.  5. The inferior vena cava is normal in size with <50% respiratory variability, suggesting right atrial pressure of 8 mmHg. FINDINGS  Left Ventricle: Left ventricular ejection fraction, by estimation, is 60 to 65%. The left ventricle has normal function. The left ventricle has no regional wall motion abnormalities. The left ventricular internal cavity size was normal in size. There is  no left ventricular hypertrophy. Left ventricular diastolic parameters were normal. Right Ventricle: The right ventricular size is normal. No increase in right ventricular wall thickness. Right ventricular systolic function is normal. Tricuspid regurgitation signal is inadequate for assessing PA pressure. Left Atrium: Left atrial size was normal in size. Right Atrium: Right atrial size was normal in size. Pericardium: There is no evidence of pericardial effusion. Mitral Valve: The mitral valve is normal in structure. Trivial mitral valve regurgitation. No evidence of mitral valve stenosis. Tricuspid Valve: The tricuspid valve is  normal in structure. Tricuspid valve regurgitation is trivial. No evidence of tricuspid stenosis. Aortic Valve: The aortic valve is  normal in structure. Aortic valve regurgitation is not visualized. No aortic stenosis is present. Aortic valve mean gradient measures 4.0 mmHg. Aortic valve peak gradient measures 5.5 mmHg. Aortic valve area, by VTI measures 2.24 cm. Pulmonic Valve: The pulmonic valve was grossly normal. Pulmonic valve regurgitation is trivial. No evidence of pulmonic stenosis. Aorta: The aortic root and ascending aorta are structurally normal, with no evidence of dilitation. Venous: The inferior vena cava is normal in size with less than 50% respiratory variability, suggesting right atrial pressure of 8 mmHg. IAS/Shunts: No atrial level shunt detected by color flow Doppler.  LEFT VENTRICLE PLAX 2D LVIDd:         3.90 cm   Diastology LVIDs:         2.50 cm   LV e' medial:    9.79 cm/s LV PW:         0.70 cm   LV E/e' medial:  8.2 LV IVS:        0.80 cm   LV e' lateral:   11.10 cm/s LVOT diam:     1.80 cm   LV E/e' lateral: 7.3 LV SV:         50 LV SV Index:   32 LVOT Area:     2.54 cm  RIGHT VENTRICLE RV S prime:     13.20 cm/s TAPSE (M-mode): 1.8 cm LEFT ATRIUM             Index        RIGHT ATRIUM          Index LA Vol (A2C):   33.1 ml 21.30 ml/m  RA Area:     9.42 cm LA Vol (A4C):   24.4 ml 15.70 ml/m  RA Volume:   20.80 ml 13.38 ml/m LA Biplane Vol: 31.0 ml 19.94 ml/m  AORTIC VALVE AV Area (Vmax):    2.26 cm AV Area (Vmean):   2.17 cm AV Area (VTI):     2.24 cm AV Vmax:           117.00 cm/s AV Vmean:          91.000 cm/s AV VTI:            0.223 m AV Peak Grad:      5.5 mmHg AV Mean Grad:      4.0 mmHg LVOT Vmax:         104.00 cm/s LVOT Vmean:        77.600 cm/s LVOT VTI:          0.196 m LVOT/AV VTI ratio: 0.88  AORTA Ao Root diam: 2.60 cm MITRAL VALVE MV Area (PHT): 4.10 cm    SHUNTS MV Decel Time: 185 msec    Systemic VTI:  0.20 m MV E velocity: 80.70 cm/s  Systemic Diam: 1.80 cm MV A velocity: 76.50 cm/s MV E/A ratio:  1.05 Mihai Croitoru MD Electronically signed by Jerel Balding MD Signature Date/Time:  02/06/2024/1:28:34 PM    Final    CT HEAD WO CONTRAST ( ) Result Date: 02/05/2024 EXAM: CT HEAD WITHOUT CONTRAST 02/05/2024 04:41:52 PM TECHNIQUE: CT of the head was performed without the administration of intravenous contrast. Automated exposure control, iterative reconstruction, and/or weight based adjustment of the mA/kV was utilized to reduce the radiation dose to as low as reasonably achievable. COMPARISON: MRI head without contrast 02/05/2024 at 3:31 am. CLINICAL HISTORY: Stroke, follow  up. Altered Mental Status. FINDINGS: BRAIN AND VENTRICLES: Acute/subacute nonhemorrhagic infarcts involving the right lentiform nucleus and superior caudate head are again noted. No acute hemorrhage is present. The small cortical infarct does not discretely visualized by CT. No new ischemic infarcts are present. No hydrocephalus. No extra-axial collection. No mass effect or midline shift. ORBITS: No acute abnormality. SINUSES: No acute abnormality. SOFT TISSUES AND SKULL: No acute soft tissue abnormality. No skull fracture. IMPRESSION: 1. Acute/subacute nonhemorrhagic infarcts involving the right lentiform nucleus and superior caudate head, as seen on prior MRI; no new ischemic infarcts. 2. No acute intracranial hemorrhage. Electronically signed by: Lonni Necessary MD 02/05/2024 05:04 PM EDT RP Workstation: [id number]   MR ANGIO HEAD WO CONTRAST Result Date: 02/05/2024 EXAM: MR Angiography Head without intravenous Contrast. 02/05/2024 04:08:56 AM TECHNIQUE: Magnetic resonance angiography images of the head without intravenous contrast. Multiplanar 2D and 3D reformatted images are provided for review. COMPARISON: CTA head and neck 02/04/2024. CLINICAL HISTORY: 38 year old female with stroke, follow up. FINDINGS: ANTERIOR CIRCULATION: Antegrade flow in both ICA siphons appears symmetric. Patent carotid terminus. No significant stenosis of the internal carotid arteries. Patent ACA origins. Normal anterior  communicating artery. No significant stenosis of the anterior cerebral arteries. Patent MCA origins. Right MCA trifurcation now appears patent and improved right MCA branch flow signal/appearance compared to the CTA 02/04/2024 (series 1024 image 30). No residual stenosis or occlusion identified. No significant stenosis of the left middle cerebral artery. No aneurysm. POSTERIOR CIRCULATION: Antegrade flow in the posterior circulation with dominant distal right vertebral artery. No significant stenosis of the vertebral arteries. No significant stenosis of the basilar artery. No significant stenosis of the posterior cerebral arteries. No aneurysm. IMPRESSION: 1. Revascularized right MCA trifurcation and substantially improved right MCA branch appearance. No residual stenosis or occlusion identified. 2. MRI reported separately. Electronically signed by: Helayne Hurst MD 02/05/2024 04:24 AM EDT RP Workstation: [id number]   MR BRAIN WO CONTRAST Result Date: 02/05/2024 EXAM: MR Brain without Intravenous Contrast. CLINICAL HISTORY: 38 year old female code stroke presentation yesterday. Right MCA M2 branch occlusion status post endovascular revascularization. TECHNIQUE: Magnetic resonance images of the brain without intravenous contrast in multiple planes. CONTRAST: Without. COMPARISON: Head CT, CTA yesterday. Intracranial MRI today is reported separately. FINDINGS: BRAIN: Restricted diffusion is present in the right basal ganglia and minimal cortical restricted diffusion is superimposed at the anterior right frontal operculum (series 5 image 84). T2 and FLAIR hyperintense cytotoxic edema is noted. Trace abnormal cortical diffusion is also present at the right insula and in the posterior right temporal lobe (series 5 images 72 and 74). No left hemisphere or posterior fossa diffusion restriction. No acute hemorrhagic transformation. SWI suggests possible chronic microhemorrhage in the central pons (series 12 image 18). No  other chronic cerebral blood products. A small focus of chronic cortical encephalomalacia is present in the inferior right parietal lobe on series 11 image 29. Scattered moderate for age cerebral white matter T2 and FLAIR hyperintensity is seen in both hemispheres. No intracranial mass effect. No midline shift or extra-axial fluid collection. No cerebellar tonsillar ectopia. The central arterial and venous flow voids are patent. VENTRICLES: No hydrocephalus. ORBITS: The orbits are normal. SINUSES AND MASTOIDS: The sinuses and mastoid air cells are clear. BONES: No acute fracture or focal osseous lesion. IMPRESSION: 1. Acute right basal ganglia infarct with subtle additional scattered cortical infarcts in the right MCA territory. Cytotoxic edema, but no hemorrhagic transformation or mass effect. 2. Possible chronic microhemorrhage in the central  pons. Subtle chronic cortical encephalomalacia suspected in the right parietal lobe. And moderate for age chronic white matter changes otherwise. Electronically signed by: Helayne Hurst MD 02/05/2024 04:19 AM EDT RP Workstation: [id number]   CT HEAD WO CONTRAST ( ) Result Date: 02/04/2024 EXAM: CT HEAD WITHOUT CONTRAST 02/04/2024 10:06:26 PM TECHNIQUE: CT of the head was performed without the administration of intravenous contrast. Automated exposure control, iterative reconstruction, and/or weight based adjustment of the mA/kV was utilized to reduce the radiation dose to as low as reasonably achievable. COMPARISON: None available. CLINICAL HISTORY: Neuro deficit, acute, stroke suspected. FINDINGS: BRAIN AND VENTRICLES: No acute hemorrhage. No evidence of acute infarct. No hydrocephalus. No extra-axial collection. No mass effect or midline shift. ORBITS: No acute abnormality. SINUSES: No acute abnormality. SOFT TISSUES AND SKULL: No acute soft tissue abnormality. No skull fracture. IMPRESSION: 1. No evidence of acute intracranial abnormality. Electronically signed by:  Gilmore Molt MD 02/04/2024 10:36 PM EDT RP Workstation: [id number]   CT ANGIO HEAD NECK W WO CM (CODE STROKE) Result Date: 02/04/2024 EXAM: CTA HEAD AND NECK WITH AND WITHOUT 02/04/2024 03:06:21 PM TECHNIQUE: CTA of the head and neck was performed with and without the administration of 100 mL of iohexol  (OMNIPAQUE ) 350 MG/ML injection. Multiplanar 2D and/or 3D reformatted images are provided for review. Automated exposure control, iterative reconstruction, and/or weight based adjustment of the mA/kV was utilized to reduce the radiation dose to as low as reasonably achievable. Stenosis of the internal carotid arteries measured using NASCET criteria. COMPARISON: CT head without contrast 02/04/2024 CLINICAL HISTORY: Neuro deficit, acute, stroke suspected. FINDINGS: CTA NECK: AORTIC ARCH AND ARCH VESSELS: No dissection or arterial injury. No significant stenosis of the brachiocephalic or subclavian arteries. CERVICAL CAROTID ARTERIES: No dissection, arterial injury, or hemodynamically significant stenosis by NASCET criteria. CERVICAL VERTEBRAL ARTERIES: No dissection, arterial injury, or significant stenosis. LUNGS AND MEDIASTINUM: Unremarkable. SOFT TISSUES: No acute abnormality. BONES: No acute abnormality. CTA HEAD: ANTERIOR CIRCULATION: No significant stenosis of the internal carotid arteries. No significant stenosis of the anterior cerebral arteries. Proximal right M2 occlusion is present just distal to inferior temporal branches. More distal posterolateral branches are filled by pial collateral flow. The occlusion impacts much of the right MCA distribution. The left middle cerebral artery is patent proximally without significant stenosis. No aneurysm. POSTERIOR CIRCULATION: No significant stenosis of the posterior cerebral arteries. No significant stenosis of the basilar artery. No significant stenosis of the vertebral arteries. No aneurysm. OTHER: No dural venous sinus thrombosis on this non-dedicated  study. Findings were communicated to doctor Aisha Seals by voice and text message at 3:05 PM. IMPRESSION: 1. Proximal right M2 occlusion just distal to inferior temporal branches, impacting much of the right MCA distribution. 2. More distal posterolateral branches fill via collateral flow. 3. Critical results communicated to Dr. Aisha Seals at 3:05 PM. Electronically signed by: Lonni Necessary MD 02/04/2024 03:19 PM EDT RP Workstation: [id number]   CT HEAD CODE STROKE WO CONTRAST (LKW 0-4.5h, LVO 0-24h) Result Date: 02/04/2024 EXAM: CT HEAD WITHOUT CONTRAST 02/04/2024 02:46:19 PM TECHNIQUE: CT of the head was performed without the administration of intravenous contrast. Automated exposure control, iterative reconstruction, and/or weight based adjustment of the mA/kV was utilized to reduce the radiation dose to as low as reasonably achievable. COMPARISON: None available. CLINICAL HISTORY: Neuro deficit, acute, stroke suspected. Left sided weakness. FINDINGS: BRAIN AND VENTRICLES: No acute hemorrhage. No evidence of acute infarct. No hydrocephalus. No extra-axial collection. No mass effect or midline shift. [place] stroke program early CT (ASPECT)  score: Ganglionic (caudate, IC, lentiform nucleus, insula, M1-M3): 7 Supraganglionic (M4-M6): 3 Total: 10 ORBITS: No acute abnormality. SINUSES: No acute abnormality. SOFT TISSUES AND SKULL: No acute soft tissue abnormality. No skull fracture. Kirkpatrick 252 pm IMPRESSION: 1. No acute intracranial abnormality.  ASPECTS 10/10 The pertinent results were texted to Dr. Michaela via the [facility] system at 2:54 pm. Electronically signed by: Lonni Necessary MD 02/04/2024 02:55 PM EDT RP Workstation: [id number]       HISTORY OF PRESENT ILLNESS 38 y.o. patient with history of hypertension and smoking was admitted with left-sided weakness and headache.  HOSPITAL COURSE Patient was given TNK to treat stroke and was found on CTA to have right M2  occlusion.  Mechanical thrombectomy was performed and was successful.  She underwent a TEE on 10/14, and no significant PFO or cardiac thrombus was found.  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 MRA  Revascularized right MCA trifurcation and substantially improved right MCA branch appearance. 2D Echo EF 60-65% TEE 10/14 EF 60 to 65%, no thrombus or masses, small amount of right-to-left shunting with saline microbubble study occurring after 6 beats suggesting intrapulmonary shunting, no clear PFO and no significant valve disease TCD bubble study Negative  LE venous Doppler No DVT Hypercoagulable workup neg, some are still pending LDL 124 HgbA1c 5.2 UDS positive for THC Lovenox  for VTE prophylaxis No antithrombotic prior to admission, now on aspirin 81 mg daily and clopidogrel 75 mg daily DAPT for 3 weeks and then ASA alone Patient counseled to be compliant with her antithrombotic medications Ongoing aggressive stroke risk factor management Therapy recommendations:  none Disposition: Home   Hypertension Stable Home BP monitoring Long term BP goal normotensive   Hyperlipidemia Home meds: None LDL 124, goal < 70 Now on Lipitor 40 Continue statin at discharge Educated on avoid pregnancy while on statin   Tobacco abuse Current heavy smoker Smoking cessation counseling provided Nicotine patch provided Pt is willing to quit   Other Stroke Risk Factors Substance abuse with THC, cessation education provided. Pt is willing to quit   Other Active Problems Mild leukocytosis, WBC 12.5-10.9-14.2-12.8 -  11.5 - 10.5 Mom reported tremendous stress at home with depressed mood  Hypokalemia, Replaced   DISCHARGE EXAM  PHYSICAL EXAM General:  Alert, well-nourished, well-developed patient in no acute distress Psych:  Mood and affect appropriate for situation CV: Regular rate and rhythm on monitor Respiratory:  Regular, unlabored respirations on room air GI: Abdomen soft and nontender  NEURO:  Mental Status: AA&Ox3  Speech/Language: speech is without dysarthria or aphasia.    Cranial Nerves:  II: PERRL. Visual fields full.  III, IV, VI: EOMI. Eyelids elevate symmetrically.  V: Sensation is intact to light touch and symmetrical to face.  VII: Smile is symmetrical.  VIII: hearing intact to voice. IX, X: Phonation is normal.  KP:Dynloizm shrug 5/5. XII: tongue is midline without fasciculations. Motor: 5/5 strength to all muscle groups tested.  Tone: is normal and bulk is normal Sensation- Intact to light touch bilaterally.  Coordination: FTN intact bilaterally,  Gait- deferred  1a Level of Conscious.: 0 1b LOC Questions: 0  1c LOC Commands: 0 2 Best Gaze: 0 3 Visual: 0 4 Facial Palsy: 0 5a Motor Arm - left: 0 5b Motor Arm - Right: 0 6a Motor Leg - Left: 0 6b Motor Leg - Right: 0 7 Limb Ataxia: 0 8 Sensory: 0 9 Best Language: 0 10 Dysarthria: 0 11 Extinct. and Inatten.: 0 TOTAL: 0   Discharge Diet       Diet   Diet Heart Room service appropriate? Yes; Fluid consistency: Thin   liquids  DISCHARGE PLAN Disposition: Home aspirin 81 mg daily and clopidogrel 75 mg daily for secondary stroke prevention for 3 weeks then aspirin 81 mg daily alone. Ongoing stroke risk factor control by Primary Care Physician at time of discharge Follow-up PCP [facility] in 2 weeks. Follow-up in [facility] in 8 weeks, office to schedule an appointment  35 minutes were spent preparing discharge.  Cortney E Everitt Clint Kill , MSN,  AGACNP-BC [facility] See Amion for schedule and pager information 02/08/2024 12:39 PM   ATTENDING NOTE: I reviewed above note and agree with the assessment and plan. Pt was seen and examined.   No acute event overnight, neuro intact. Pt had TEE today unremarkable. Medically ready for discharge. Educated again on quit smoking and THC. Continue DAPT and statin. Follow up at [facility].   For detailed assessment and plan, please refer to above as I have made changes wherever appropriate.   Ary Cummins, MD PhD Stroke Neurology 02/08/2024 3:52 PM

## 2024-02-08 NOTE — Anesthesia Postprocedure Evaluation (Signed)
 Anesthesia Post Note  Patient: Ruth Mullen  Procedure(s) Performed: TRANSESOPHAGEAL ECHOCARDIOGRAM     Patient location during evaluation: PACU Anesthesia Type: MAC Level of consciousness: awake and alert Pain management: pain level controlled Vital Signs Assessment: post-procedure vital signs reviewed and stable Respiratory status: spontaneous breathing, nonlabored ventilation and respiratory function stable Cardiovascular status: blood pressure returned to baseline and stable Postop Assessment: no apparent nausea or vomiting Anesthetic complications: no   No notable events documented.  Last Vitals:  Vitals:   02/08/24 0833 02/08/24 0843  BP: 132/88 (!) 133/96  Pulse: 86 87  Resp: 19 20  Temp:    SpO2: 100% 100%    Last Pain:  Vitals:   02/08/24 0843  TempSrc:   PainSc: 0-No pain                 Butler Levander Pinal

## 2024-02-08 NOTE — Plan of Care (Signed)
  Problem: Education: Goal: Knowledge of General Education information will improve Description: Including pain rating scale, medication(s)/side effects and non-pharmacologic comfort measures Outcome: Progressing   Problem: Clinical Measurements: Goal: Ability to maintain clinical measurements within normal limits will improve Outcome: Progressing Goal: Respiratory complications will improve Outcome: Progressing   

## 2024-02-08 NOTE — CV Procedure (Signed)
 TRANSESOPHAGEAL ECHOCARDIOGRAM (TEE) NOTE  INDICATIONS: Cryptogenic stroke  PROCEDURE:   Informed consent was obtained prior to the procedure. The risks, benefits and alternatives for the procedure were discussed and the patient comprehended these risks.  Risks include, but are not limited to, cough, sore throat, vomiting, nausea, somnolence, esophageal and stomach trauma or perforation, bleeding, low blood pressure, aspiration, pneumonia, infection, trauma to the teeth and death.    After a procedural time-out, the patient was given propofol for sedation by anesthesia. See their separate report.  The patient's heart rate, blood pressure, and oxygen saturation are monitored continuously during the procedure.The oropharynx was anesthetized with topical cetacaine.  The transesophageal probe was inserted in the esophagus and stomach without difficulty and multiple views were obtained.  The patient was kept under observation until the patient left the procedure room.  I was present face-to-face 100% of this time. The patient left the procedure room in stable condition.   Agitated microbubble saline contrast was administered.  COMPLICATIONS:    There were no immediate complications.  Findings:  LEFT VENTRICLE: The left ventricular wall thickness is normal.  The left ventricular cavity is normal in size. Wall motion is normal.  LVEF is 60-65%.  RIGHT VENTRICLE:  The right ventricle is normal in structure and function without any thrombus or masses.    LEFT ATRIUM:  The left atrium is normal in size without any thrombus or masses.  There is not spontaneous echo contrast (smoke) in the left atrium consistent with a low flow state.  LEFT ATRIAL APPENDAGE:  The left atrial appendage is free of any thrombus or masses. The appendage has single lobes. Pulse doppler indicates high flow in the appendage.  ATRIAL SEPTUM:  The atrial septum appears intact and is free of thrombus and/or masses. No  aneurysm was noted.  There is evidence for a small amount of shunting from right to left by saline microbubble study, however, this occurred after 6 beats, suggesting more likely intrapulmonary shunting. Valsalva did not provoke interatrial shunting.  RIGHT ATRIUM:  The right atrium is normal in size and function without any thrombus or masses.  MITRAL VALVE:  The mitral valve is normal in structure and function with trivial regurgitation.  There were no vegetations or stenosis.  AORTIC VALVE:  The aortic valve is trileaflet, normal in structure and function with no regurgitation.  There were no vegetations or stenosis  TRICUSPID VALVE:  The tricuspid valve is normal in structure and function with trivial regurgitation.  There were no vegetations or stenosis   PULMONIC VALVE:  The pulmonic valve is normal in structure and function with trivial regurgitation.  There were no vegetations or stenosis.   AORTIC ARCH, ASCENDING AND DESCENDING AORTA:  There was no Shaune et. Al, 1992) atherosclerosis of the ascending aorta, aortic arch, or proximal descending aorta.  12. PULMONARY VEINS: Anomalous pulmonary venous return was not noted.  13. PERICARDIUM: The pericardium appeared normal and non-thickened.  There is no pericardial effusion.  IMPRESSION:   Small amount of left-sided saline microbubbles noted, after 6 beats, likely suggestive of extracardiac shunting. No clear PFO. No LAA thrombus No significant valve disease LVEF 60-65%  RECOMMENDATIONS:     No obvious intracardiac source of shunting.  A few late microbubbles were noted in the right atrium, possibly due to intrapulmonary shunting.  Time Spent Directly with the Patient:  30 minutes   Vinie KYM Maxcy, MD, [facility], FNLA, FACP  [place]  [facility]  Medical  Director of the Advanced Lipid Disorders &  Cardiovascular Risk Reduction Clinic Diplomate of the American Board of Clinical Lipidology Attending Cardiologist  Direct  Dial: [phone]  Fax: [phone]  Website:  [email or web address] Vinie BROCKS Jandy Brackens 02/08/2024, 8:19 AM

## 2024-02-08 NOTE — Interval H&P Note (Signed)
 History and Physical Interval Note:  02/08/2024 7:48 AM  Ruth Mullen  has presented today for surgery, with the diagnosis of CVA.  The various methods of treatment have been discussed with the patient and family. After consideration of risks, benefits and other options for treatment, the patient has consented to  Procedure(s): TRANSESOPHAGEAL ECHOCARDIOGRAM (N/A) as a surgical intervention.  The patient's history has been reviewed, patient examined, no change in status, stable for surgery.  I have reviewed the patient's chart and labs.  Questions were answered to the patient's satisfaction.     Vinie JAYSON Maxcy

## 2024-02-08 NOTE — TOC Transition Note (Signed)
 Transition of Care [facility]) - Discharge Note   Patient Details  Name: Ruth Mullen MRN: [id number] Date of Birth: 08/05/1985  [facility] CM/SW Contact:  Andrez JULIANNA George, RN Phone Number: 02/08/2024, 12:55 PM   Clinical Narrative:     Pt is discharging home with self care. No needs per IP Care management. Pt has transportation home.   Final next level of care: Home/Self Care Barriers to Discharge: No Barriers Identified   Patient Goals and CMS Choice            Discharge Placement                       Discharge Plan and Services Additional resources added to the After Visit Summary for     Discharge Planning Services: CM Consult                                 Social Drivers of Health (SDOH) Interventions SDOH Screenings   Food Insecurity: No Food Insecurity (02/06/2024)  Housing: Low Risk  (02/06/2024)  Transportation Needs: No Transportation Needs (02/06/2024)  Utilities: Not At Risk (02/06/2024)  Tobacco Use: High Risk (02/04/2024)     Readmission Risk Interventions     No data to display

## 2024-02-08 NOTE — Transfer of Care (Signed)
 Immediate Anesthesia Transfer of Care Note  Patient: DURENE DODGE  Procedure(s) Performed: TRANSESOPHAGEAL ECHOCARDIOGRAM  Patient Location: PACU and Cath Lab  Anesthesia Type:MAC  Level of Consciousness: sedated  Airway & Oxygen Therapy: Patient Spontanous Breathing and Patient connected to nasal cannula oxygen  Post-op Assessment: Report given to RN and Post -op Vital signs reviewed and stable  Post vital signs: stable  Last Vitals:  Vitals Value Taken Time  BP 119/83 02/08/24 08:23  Temp 36.8 C 02/08/24 08:23  Pulse 92 02/08/24 08:23  Resp 24 02/08/24 08:24  SpO2 98 % 02/08/24 08:23  Vitals shown include unfiled device data.  Last Pain:  Vitals:   02/08/24 0823  TempSrc: Tympanic  PainSc: Asleep      Patients Stated Pain Goal: 0 (02/05/24 1200)  Complications: No notable events documented.

## 2024-02-08 NOTE — Discharge Instructions (Addendum)
 Ruth Mullen, you came to the hospital with left-sided weakness.  You were found to be having a stroke and were treated with TNK and a mechanical thrombectomy.  You will need to follow up with your PCP and will need to be seen in the stroke clinic in 8 weeks.  You will not need any PT or OT follow up.  Please take your aspirin and Plavix daily for 21 days and then aspirin daily indefinitely.  It is also very important for you to quit smoking.

## 2024-02-08 NOTE — Progress Notes (Signed)
 Patient discharged home with mom.  IV and telemetry discontinued.  AVS discussed.  Patient is comfortable, denies pain upon discharge.  Will pck up TOC medicines from pharmacy.

## 2024-02-09 ENCOUNTER — Ambulatory Visit: Payer: Self-pay

## 2024-02-09 ENCOUNTER — Encounter (HOSPITAL_COMMUNITY): Payer: Self-pay | Admitting: Internal Medicine

## 2024-02-09 NOTE — Telephone Encounter (Signed)
 Reason for Disposition  Difficulty falling to sleep or staying asleep  Answer Assessment - Initial Assessment Questions Pt recently had a stroke. She was discharged yesterday about 130pm. Mother states that she slept yesterday morning after a procedure where they gave her anesthesia. She states she hasn't been able to sleep at all since then.     1. DESCRIPTION: Tell me about your sleeping problem. (e.g., waking frequently during night, sleeping during day and awake at night, trouble falling asleep) How bad is it?      Unable to fall asleep for the last 24 hours.  2. ONSET: How long have you been having trouble sleeping? (e.g., days, weeks, months; longstanding sleep problems)    Just yesterday 3. DAYTIME SLEEP PATTERN: How much time do you spend sleeping or napping during the day?     no 4. STRESSORS: Is there anything that is making you feel stressed? Is there something that worries you?     Recently had a stroke 5. PAIN: Do you have any pain that is keeping you awake? (e.g., back pain, joint pain) If Yes, ask How bad is the pain? (e.g., scale 0-10; mild, moderate, severe).     No  6. CAFFEINE: Do you drink caffeinated beverages? If Yes, ask How much each day? (e.g., coffee, tea, colas)     No  7. ALCOHOL USE OR SUBSTANCE USE: Do you drink alcohol or use any substances?     no 8. MEDICINE CHANGE: Has there been any recent change in medicines? (e.g., new medicine started, stopped, or dose changed).      Yes nicotine patches, plavix, aspirin, atorvastatin 9. TREATMENT: What have you done so far to treat this sleep problem? (e.g., prescription or OTC sleep medicines, herbal or dietary supplements, cannabis, relaxation strategies)     Tried camomile tea and some other home remedies 10. OTHER SYMPTOMS: Do you have any other symptoms?  (e.g., difficulty breathing)       no  Protocols used: Insomnia-A-AH

## 2024-02-09 NOTE — Telephone Encounter (Signed)
 FYI Only or Action Required?: FYI only for provider.  Patient was last seen in primary care on 04/07/2022 by Moishe Chiquita HERO, NP.  Called Nurse Triage reporting Insomnia.  Symptoms began today.  Interventions attempted: Rest, hydration, or home remedies and Other: camomile tea.  Symptoms are: unchanged.  Triage Disposition: Home Care  Patient/caregiver understands and will follow disposition?: Yes

## 2024-02-10 LAB — FACTOR 5 LEIDEN

## 2024-02-11 ENCOUNTER — Ambulatory Visit: Payer: Self-pay

## 2024-02-11 NOTE — Telephone Encounter (Signed)
 FYI Only or Action Required?: FYI only for provider.  Patient was last seen in primary care on 04/07/2022 by Moishe Chiquita HERO, NP.  Called Nurse Triage reporting Headache.  Symptoms began several days ago.  Interventions attempted: Nothing.  Symptoms are: unchanged.  Triage Disposition: See Physician Within 24 Hours  Patient/caregiver understands and will follow disposition?: Yes  **New patient appt. Is scheduled or 10/31; referred to ED for current symptoms based upon recent diagnosis**       Copied from CRM #[id number]. Topic: Clinical - Red Word Triage >> Feb 11, 2024  1:47 PM [name] wrote: Red Word that prompted transfer to Nurse Triage: Mother Ruth Mullen states patient is having headaches & feeling kind of jittery. NP appointment scheduled 10/31, discharged for recent stroke. Reason for Disposition  [1] MODERATE headache (e.g., interferes with normal activities) AND [2] present > 24 hours AND [3] unexplained  (Exceptions: Pain medicines not tried, typical migraine, or headache part of viral illness.)  Answer Assessment - Initial Assessment Questions 1. LOCATION: Where does it hurt?      Left side   2. ONSET: When did the headache start? (e.g., minutes, hours, days)      X 45 minutes ago  3. PATTERN: Does the pain come and go, or has it been constant since it started?     Constant   4. SEVERITY: How bad is the pain? and What does it keep you from doing?  (e.g., Scale 1-10; mild, moderate, or severe)     5/10  5. RECURRENT SYMPTOM: Have you ever had headaches before? If Yes, ask: When was the last time? and What happened that time?      Seen in ED on 10/13, has stroke on 10/10 blood clot found, procedure done to remove clot. Currently on TPA.   6. CAUSE: What do you think is causing the headache?     Unsure  7. MIGRAINE: Have you been diagnosed with migraine headaches? If Yes, ask: Is this headache similar?      No  8. HEAD INJURY: Has there  been any recent injury to your head?      No   9. OTHER SYMPTOMS: Do you have any other symptoms? (e.g., fever, stiff neck, eye pain, sore throat, cold symptoms)     feeling kind of jittery   10. PREGNANCY: Is there any chance you are pregnant? When was your last menstrual period?       No  Referred back to ED for symptoms based on her history. New patient appt. Is scheduled or 10/31  Protocols used: [facility]

## 2024-02-15 LAB — PROTHROMBIN GENE MUTATION

## 2024-02-17 ENCOUNTER — Ambulatory Visit (INDEPENDENT_AMBULATORY_CARE_PROVIDER_SITE_OTHER): Payer: Self-pay | Admitting: Family Medicine

## 2024-02-17 VITALS — BP 135/68 | HR 77 | Temp 97.8°F | Ht 60.0 in | Wt 132.8 lb

## 2024-02-17 DIAGNOSIS — Z8673 Personal history of transient ischemic attack (TIA), and cerebral infarction without residual deficits: Secondary | ICD-10-CM | POA: Diagnosis not present

## 2024-02-17 DIAGNOSIS — Z87891 Personal history of nicotine dependence: Secondary | ICD-10-CM

## 2024-02-17 DIAGNOSIS — G479 Sleep disorder, unspecified: Secondary | ICD-10-CM | POA: Diagnosis not present

## 2024-02-17 MED ORDER — HYDROXYZINE PAMOATE 25 MG PO CAPS: 25.0000 mg | ORAL_CAPSULE | Freq: Three times a day (TID) | ORAL | 0 refills | Status: DC | PRN

## 2024-02-17 NOTE — Progress Notes (Signed)
 New Patient Office Visit  Subjective    Patient ID: Ruth Mullen, female    DOB: 08-25-1985  Age: 38 y.o. MRN: [id number]  CC:  Chief Complaint  Patient presents with   Establish Care   Hospitalization Follow-up    STROKE ON 02/04/2024. WAS ADMITTED AT [facility].    Insomnia    NOT ABLE TO SLEEP SINCE HAVING A STROKE   Headache    HISTORY OF FREQUENT HEADACHES (OFF AND ON)    HPI Ruth Mullen presents to establish care.  02/04/24 -patient developed left-sided weakness and was taken to the ED.  She was found on CTA to have a right M2 occlusion.  She was given TNK and mechanical thrombectomy was performed.  She underwent a TEE on 02/08/24 and no significant PFO or cardiac thrombus was found.  The embolic pattern is secondary to unclear source, risk factors included heavy smoker and THC use.  Patient's strength returned post procedure.  Patient was started on Lipitor 40, aspirin 81 mg/day, and clopidogrel 75 mg/day for 3 weeks and then ASA alone.  Patient reports that if she reads something she can remember what she read but if she watches TV she can not remember what she watched very well.   Good strength to all extremities but when sitting still her L hand shakes some.   Patient smoked heavily before CVA event. She was been wearing patch and not smoking since leaving hospital.   Has neurology appointment 04/05/24  Cardiology appointment 05/11/24  Patient reports that she is having difficulty sleeping falling and maintaining sleep.  She has tried melatonin in the past (before CVA event) but it caused her headaches.    Outpatient Encounter Medications as of 02/17/2024  Medication Sig   aspirin EC 81 MG tablet Take 1 tablet (81 mg total) by mouth daily. Swallow whole.   atorvastatin (LIPITOR) 40 MG tablet Take 1 tablet (40 mg total) by mouth daily.   clopidogrel (PLAVIX) 75 MG tablet Take 1 tablet (75 mg total) by mouth daily.   hydrOXYzine (VISTARIL) 25 MG  capsule Take 1 capsule (25 mg total) by mouth every 8 (eight) hours as needed.   nicotine (NICODERM CQ - DOSED IN MG/24 HOURS) 14 mg/24hr patch Place 1 patch (14 mg total) onto the skin daily.   [DISCONTINUED] pantoprazole  (PROTONIX ) 40 MG tablet Take 1 tablet (40 mg total) by mouth daily. (Patient not taking: No sig reported)   No facility-administered encounter medications on file as of 02/17/2024.    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   Hypertension    Menorrhagia with regular cycle 11/19/2015   Vaginal discharge 09/07/2013    Past Surgical History:  Procedure Laterality Date   CERVICAL CERCLAGE N/A 02/08/2013   Procedure: CERCLAGE CERVICAL MCDONALD;  Surgeon: Norleen LULLA Server, MD;  Location: [facility];  Service: Gynecology;  Laterality: N/A;   CHOLECYSTECTOMY     DILATION AND CURETTAGE OF UTERUS     IR PERCUTANEOUS ART THROMBECTOMY/INFUSION INTRACRANIAL INC DIAG ANGIO  02/04/2024   IR US  GUIDE VASC ACCESS RIGHT  02/04/2024   RADIOLOGY WITH ANESTHESIA N/A 02/04/2024   Procedure: RADIOLOGY WITH ANESTHESIA;  Surgeon: Radiologist, Medication, MD;  Location: [facility] OR;  Service: Radiology;  Laterality: N/A;  TRANSESOPHAGEAL ECHOCARDIOGRAM (CATH LAB) N/A 02/08/2024   Procedure: TRANSESOPHAGEAL ECHOCARDIOGRAM;  Surgeon: Mona Vinie BROCKS, MD;  Location: [facility] INVASIVE CV [facility];  Service: Cardiovascular;  Laterality: N/A;    Family History  Problem Relation Age of Onset   Hypertension Mother     Social History   Socioeconomic History   Marital status: Single    Spouse name: Not on file   Number of children: Not on file   Years of education: Not on file   Highest education level: Associate degree: occupational, Scientist, product/process development, or vocational program  Occupational History   Not on file   Tobacco Use   Smoking status: Every Day    Current packs/day: 0.00    Average packs/day: 1 pack/day for 1 year (1.0 ttl pk-yrs)    Types: Cigarettes    Start date: 09/20/2013    Last attempt to quit: 09/21/2014    Years since quitting: 9.4   Smokeless tobacco: Never  Vaping Use   Vaping status: Never Used  Substance and Sexual Activity   Alcohol use: No   Drug use: Yes    Frequency: 1.0 times per week    Types: Marijuana    Comment: once a day   Sexual activity: Yes    Birth control/protection: Pill  Other Topics Concern   Not on file  Social History Narrative   Not on file   Social Drivers of Health   Financial Resource Strain: Low Risk  (02/17/2024)   Overall Financial Resource Strain (CARDIA)    Difficulty of Paying Living Expenses: Not very hard  Food Insecurity: No Food Insecurity (02/17/2024)   Hunger Vital Sign    Worried About Running Out of Food in the Last Year: Never true    Ran Out of Food in the Last Year: Never true  Transportation Needs: No Transportation Needs (02/17/2024)   PRAPARE - Administrator, Civil Service (Medical): No    Lack of Transportation (Non-Medical): No  Physical Activity: Insufficiently Active (02/17/2024)   Exercise Vital Sign    Days of Exercise per Week: 3 days    Minutes of Exercise per Session: 20 min  Stress: Stress Concern Present (02/17/2024)   [employer] of Occupational Health - Occupational Stress Questionnaire    Feeling of Stress: Very much  Social Connections: Socially Isolated (02/17/2024)   Social Connection and Isolation Panel    Frequency of Communication with Friends and Family: More than three times a week    Frequency of Social Gatherings with Friends and Family: Never    Attends Religious Services: Never    Database administrator or Organizations: No    Attends Engineer, structural: Not on file    Marital Status: Never married  Intimate Partner Violence: Not At Risk (02/06/2024)    Humiliation, Afraid, Rape, and Kick questionnaire    Fear of Current or Ex-Partner: No    Emotionally Abused: No    Physically Abused: No    Sexually Abused: No    ROS      Objective    BP 135/68   Pulse 77   Temp 97.8 F (36.6 C)   Ht 5' (1.524 m)   Wt 132 lb 12.8 oz (60.2 kg)   SpO2 99%   BMI 25.94 kg/m   Physical Exam Vitals reviewed.  Constitutional:      Appearance: She is well-developed.  HENT:     Head: Normocephalic.     Mouth/Throat:     Comments: No facial  droop Eyes:     Extraocular Movements: Extraocular movements intact.     Pupils: Pupils are equal, round, and reactive to light.  Cardiovascular:     Rate and Rhythm: Normal rate and regular rhythm.     Heart sounds: Normal heart sounds. No murmur heard. Pulmonary:     Effort: Pulmonary effort is normal. No respiratory distress.     Breath sounds: Normal breath sounds.  Musculoskeletal:        General: No swelling. Normal range of motion.     Cervical back: Normal range of motion.  Skin:    General: Skin is warm and dry.     Capillary Refill: Capillary refill takes less than 2 seconds.  Neurological:     Mental Status: She is alert and oriented to person, place, and time.     Sensory: No sensory deficit.     Motor: No weakness.     Gait: Gait normal.  Psychiatric:        Mood and Affect: Mood normal.        Speech: Speech normal.        Behavior: Behavior normal.         Assessment & Plan:   Problem List Items Addressed This Visit   None Visit Diagnoses       History of CVA (cerebrovascular accident)    -  Primary     Difficulty sleeping       Relevant Medications   hydrOXYzine (VISTARIL) 25 MG capsule     History of cigarette smoking          Trial vistaril for sleep. F/u if symptoms worsen or do not improve.  Continue Lipitor 40 mg/day, aspirin 81 mg/daily, Plavix 75 mg/day x 3 weeks.  04/05/24 - Neurology appointment  05/11/24 - Cardiology appointment   History smoking  -wearing patch and has not smoked since leaving the hospital.  Discussed red flag symptoms of strokes and if they appear to seek immediate medical attention.  Return in about 3 months (around 05/19/2024) for annaul wellness visit.   Oneil LELON Severin, FNP  I personally spent a total of 40 minutes in the care of the patient today including preparing to see the patient, performing a medically appropriate exam/evaluation, and documenting clinical information in the EHR.

## 2024-02-24 ENCOUNTER — Other Ambulatory Visit: Payer: Self-pay | Admitting: Family Medicine

## 2024-02-25 ENCOUNTER — Ambulatory Visit: Payer: Self-pay | Admitting: Family Medicine

## 2024-03-01 ENCOUNTER — Other Ambulatory Visit: Payer: Self-pay | Admitting: Family Medicine

## 2024-03-08 ENCOUNTER — Other Ambulatory Visit: Payer: Self-pay | Admitting: Family Medicine

## 2024-03-08 DIAGNOSIS — F17200 Nicotine dependence, unspecified, uncomplicated: Secondary | ICD-10-CM

## 2024-03-08 DIAGNOSIS — G479 Sleep disorder, unspecified: Secondary | ICD-10-CM

## 2024-03-08 MED ORDER — HYDROXYZINE PAMOATE 25 MG PO CAPS: 25.0000 mg | ORAL_CAPSULE | Freq: Every day | ORAL | 0 refills | Status: DC | PRN

## 2024-03-08 MED ORDER — NICOTINE 7 MG/24HR TD PT24: 7.0000 mg | MEDICATED_PATCH | TRANSDERMAL | 0 refills | Status: AC

## 2024-03-11 ENCOUNTER — Other Ambulatory Visit (HOSPITAL_COMMUNITY): Payer: Self-pay

## 2024-04-03 ENCOUNTER — Other Ambulatory Visit: Payer: Self-pay | Admitting: Family Medicine

## 2024-04-03 DIAGNOSIS — G479 Sleep disorder, unspecified: Secondary | ICD-10-CM

## 2024-04-05 ENCOUNTER — Encounter: Payer: Self-pay | Admitting: Neurology

## 2024-04-05 ENCOUNTER — Ambulatory Visit: Payer: Self-pay | Admitting: Neurology

## 2024-04-05 VITALS — BP 136/82 | HR 81 | Ht 60.0 in | Wt 146.4 lb

## 2024-04-05 DIAGNOSIS — E7849 Other hyperlipidemia: Secondary | ICD-10-CM | POA: Diagnosis not present

## 2024-04-05 DIAGNOSIS — I63511 Cerebral infarction due to unspecified occlusion or stenosis of right middle cerebral artery: Secondary | ICD-10-CM | POA: Diagnosis not present

## 2024-04-05 DIAGNOSIS — I639 Cerebral infarction, unspecified: Secondary | ICD-10-CM

## 2024-04-05 NOTE — Progress Notes (Signed)
 [facility] [address] [place]. [place] [address] [phone]       OFFICE CONSULT NOTE  Ruth Mullen Date of Birth:  1985-12-03 Medical Record Number:  [id number]   Referring MD:  Jorene Last, NP  Reason for Referral: Stroke  HPI: Mr. Prien is a pleasant 38 year old African-American lady seen today by initial office consultation visit for stroke.  She is accompanied by her mother.  History is obtained from them and review of electronic medical records and I personally reviewed pertinent available imaging films and PACS.  She has past medical history of hypertension, anxiety depression and dysmenorrhea.  She presented on 02/04/2024 with sudden onset of left-sided weakness.  She has been having headaches all weekend prior.  NIH stroke scale on admission was 16.  She presented outside [facility] for thrombolysis.  CT angiogram showed right distal middle cerebral artery occlusion proximal M2 segment.  After discussion of risk benefits and alternatives she underwent emergent mechanical thrombectomy with successful revascularization of the right middle cerebral artery.  Had rapid improvement.  MRI scan showed an acute right basal ganglia infarct.  Additional scattered cortical infarcts in the right MCA territory.  MRI shows a loculated right MCA.  Transthoracic echo showed EF of 60 to 65%.  Transesophageal echo showed no thrombus or masses.  There is a small amount of right-to-left shunting occurring after 6 weeks suggesting a trivial intrapulmonary shunt.  Transcranial Doppler study however was negative for right-to-left shunt.  Lab work for hypercoagulability and vasculitic etiologies were all negative.  Urine drug screen was positive only for marijuana.  LDL cholesterol 124 mg percent.  Hemoglobin A1c of 5.2.  Patient was on birth control pills which she has started only 1 week prior and used to smoke cigarettes and was advised to stop both.  Patient was discharged home.   She states she is doing well.  She has stopped Plavix  and is now on aspirin  alone and tolerating well without bruising or bleeding.  She is tolerating atorvastatin  well without muscle aches and pains.  She has no residual physical deficits from the stroke but does notice difficulty with attention, focus and concentration.  Blood pressure today is 136/82.  She notes that she has to eat healthy, exercise regularly early and lose weight.  ROS:   14 system review of systems is positive for decreased concentration, focus and attention.  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   Hypertension    Menorrhagia with regular cycle 11/19/2015   Vaginal discharge 09/07/2013    Social History:  Social History   Socioeconomic History   Marital status: Single    Spouse name: Not on file   Number of children: Not on file   Years of education: Not on file   Highest education level: Associate degree: occupational, scientist, product/process development, or vocational program  Occupational History   Not on file  Tobacco Use   Smoking status: Every Day    Current packs/day: 0.00    Average packs/day: 1 pack/day for 1 year (1.0 ttl pk-yrs)    Types: Cigarettes    Start date: 09/20/2013    Last attempt to quit: 09/21/2014    Years since quitting: 9.5   Smokeless tobacco:  Never  Vaping Use   Vaping status: Never Used  Substance and Sexual Activity   Alcohol use: No   Drug use: Yes    Frequency: 1.0 times per week    Types: Marijuana    Comment: once a day   Sexual activity: Yes    Birth control/protection: Pill  Other Topics Concern   Not on file  Social History Narrative   Not on file   Social Drivers of Health   Financial Resource Strain: Low Risk  (02/17/2024)   Overall Financial  Resource Strain (CARDIA)    Difficulty of Paying Living Expenses: Not very hard  Food Insecurity: No Food Insecurity (02/17/2024)   Hunger Vital Sign    Worried About Running Out of Food in the Last Year: Never true    Ran Out of Food in the Last Year: Never true  Transportation Needs: No Transportation Needs (02/17/2024)   PRAPARE - Administrator, Civil Service (Medical): No    Lack of Transportation (Non-Medical): No  Physical Activity: Insufficiently Active (02/17/2024)   Exercise Vital Sign    Days of Exercise per Week: 3 days    Minutes of Exercise per Session: 20 min  Stress: Stress Concern Present (02/17/2024)   [employer] of Occupational Health - Occupational Stress Questionnaire    Feeling of Stress: Very much  Social Connections: Socially Isolated (02/17/2024)   Social Connection and Isolation Panel    Frequency of Communication with Friends and Family: More than three times a week    Frequency of Social Gatherings with Friends and Family: Never    Attends Religious Services: Never    Database Administrator or Organizations: No    Attends Engineer, Structural: Not on file    Marital Status: Never married  Intimate Partner Violence: Not At Risk (02/06/2024)   Humiliation, Afraid, Rape, and Kick questionnaire    Fear of Current or Ex-Partner: No    Emotionally Abused: No    Physically Abused: No    Sexually Abused: No    Medications:   Current Outpatient Medications on File Prior to Visit  Medication Sig Dispense Refill   aspirin  EC 81 MG tablet Take 1 tablet (81 mg total) by mouth daily. Swallow whole. 30 tablet 12   atorvastatin  (LIPITOR) 40 MG tablet Take 1 tablet (40 mg total) by mouth daily. 30 tablet 2   hydrOXYzine  (VISTARIL ) 25 MG capsule TAKE 1 CAPSULE(25 MG) BY MOUTH DAILY AS NEEDED 30 capsule 1   clopidogrel  (PLAVIX ) 75 MG tablet Take 1 tablet (75 mg total) by mouth daily. (Patient not taking: Reported on 04/05/2024) 21 tablet 0    No current facility-administered medications on file prior to visit.    Allergies:   Allergies  Allergen Reactions   Clindamycin /Lincomycin Other (See Comments)    Elevated LFTs    Physical Exam General: well developed, well nourished, seated, in no evident distress Head: head normocephalic and atraumatic.   Neck: supple with no carotid or supraclavicular bruits Cardiovascular: regular rate and rhythm, no murmurs Musculoskeletal: no deformity Skin:  no rash/petichiae Vascular:  Normal pulses all extremities  Neurologic Exam Mental Status: Awake and fully alert. Oriented to place and time. Recent and remote memory intact. Attention span, concentration and fund of knowledge appropriate. Mood and affect appropriate.  Recall 3/3.  Able to name 15 animals which can walk on forelegs.  Clock drawing 3/4. Cranial Nerves: Fundoscopic exam reveals sharp disc margins. Pupils equal, briskly reactive to light.  Extraocular movements full without nystagmus. Visual fields full to confrontation. Hearing intact. Facial sensation intact. Face, tongue, palate moves normally and symmetrically.  Motor: Normal bulk and tone. Normal strength in all tested extremity muscles. Sensory.: intact to touch , pinprick , position and vibratory sensation.  Coordination: Rapid alternating movements normal in all extremities. Finger-to-nose and heel-to-shin performed accurately bilaterally. Gait and Station: Arises from chair without difficulty. Stance is normal. Gait demonstrates normal stride length and balance . Able to heel, toe and tandem walk without difficulty.  Reflexes: 1+ and symmetric. Toes downgoing.   NIHSS  0 Modified Rankin  1   ASSESSMENT: 38 year old lady with embolic right MCA infarct in October 2025 from right M2 occlusion with successful mechanical thrombectomy and explant clinical recovery.  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  Vascular risk factors of cigarette smoking, marijuana usage,  hyperlipidemia and birth control pills.    PLAN:I had a long d/w patient and her mother about her recent embolic stroke, risk for recurrent stroke/TIAs, personally independently reviewed imaging studies and stroke evaluation results and answered questions.Continue aspirin  81 mg daily  for secondary stroke prevention and maintain strict control of hypertension with blood pressure goal below 130/90, diabetes with hemoglobin A1c goal below 6.5% and lipids with LDL cholesterol goal below 70 mg/dL. I also advised the patient to eat a healthy diet with plenty of whole grains, cereals, fruits and vegetables, exercise regularly and maintain ideal body weight .I have complimented patient on quitting smoking and also advised to quit using marijuana.  Check follow-up lipid panel today.  Followup in the future with nurse practitioner in 6 months or call for earlier if necessary.   I personally spent a total of 50 minutes in the care of the patient today including getting/reviewing separately obtained history, performing a medically appropriate exam/evaluation, counseling and educating, placing orders, referring and communicating with other health care professionals, documenting clinical information in the EHR, independently interpreting results, and coordinating care.        Eather Popp, MD Note: This document was prepared with digital dictation and possible smart phrase technology. Any transcriptional errors that result from this process are unintentional.

## 2024-04-05 NOTE — Patient Instructions (Signed)
 I had a long d/w patient and her mother about her recent embolic stroke, risk for recurrent stroke/TIAs, personally independently reviewed imaging studies and stroke evaluation results and answered questions.Continue aspirin  81 mg daily  for secondary stroke prevention and maintain strict control of hypertension with blood pressure goal below 130/90, diabetes with hemoglobin A1c goal below 6.5% and lipids with LDL cholesterol goal below 70 mg/dL. I also advised the patient to eat a healthy diet with plenty of whole grains, cereals, fruits and vegetables, exercise regularly and maintain ideal body weight .I have complimented patient on quitting smoking and also advised to quit using marijuana.  Check follow-up lipid panel today.  Followup in the future with nurse practitioner in 6 months or call for earlier if necessary.  Stroke Prevention Some medical conditions and behaviors can lead to a higher chance of having a stroke. You can help prevent a stroke by eating healthy, exercising, not smoking, and managing any medical conditions you have. Stroke is a leading cause of functional impairment. Primary prevention is particularly important because a majority of strokes are first-time events. Stroke changes the lives of not only those who experience a stroke but also their family and other caregivers. How can this condition affect me? A stroke is a medical emergency and should be treated right away. A stroke can lead to brain damage and can sometimes be life-threatening. If a person gets medical treatment right away, there is a better chance of surviving and recovering from a stroke. What can increase my risk? The following medical conditions may increase your risk of a stroke: Cardiovascular disease. High blood pressure (hypertension). Diabetes. High cholesterol. Sickle cell disease. Blood clotting disorders (hypercoagulable state). Obesity. Sleep disorders (obstructive sleep apnea). Other risk factors  include: Being older than age 107. Having a history of blood clots, stroke, or mini-stroke (transient ischemic attack, TIA). Genetic factors, such as race, ethnicity, or a family history of stroke. Smoking cigarettes or using other tobacco products. Taking birth control pills, especially if you also use tobacco. Heavy use of alcohol or drugs, especially cocaine and methamphetamine. Physical inactivity. What actions can I take to prevent this? Manage your health conditions High cholesterol levels. Eating a healthy diet is important for preventing high cholesterol. If cholesterol cannot be managed through diet alone, you may need to take medicines. Take any prescribed medicines to control your cholesterol as told by your health care provider. Hypertension. To reduce your risk of stroke, try to keep your blood pressure below 130/80. Eating a healthy diet and exercising regularly are important for controlling blood pressure. If these steps are not enough to manage your blood pressure, you may need to take medicines. Take any prescribed medicines to control hypertension as told by your health care provider. Ask your health care provider if you should monitor your blood pressure at home. Have your blood pressure checked every year, even if your blood pressure is normal. Blood pressure increases with age and some medical conditions. Diabetes. Eating a healthy diet and exercising regularly are important parts of managing your blood sugar (glucose). If your blood sugar cannot be managed through diet and exercise, you may need to take medicines. Take any prescribed medicines to control your diabetes as told by your health care provider. Get evaluated for obstructive sleep apnea. Talk to your health care provider about getting a sleep evaluation if you snore a lot or have excessive sleepiness. Make sure that any other medical conditions you have, such as atrial fibrillation or atherosclerosis,  are  managed. Nutrition Follow instructions from your health care provider about what to eat or drink to help manage your health condition. These instructions may include: Reducing your daily calorie intake. Limiting how much salt (sodium) you use to 1,500 milligrams (mg) each day. Using only healthy fats for cooking, such as olive oil, canola oil, or sunflower oil. Eating healthy foods. You can do this by: Choosing foods that are high in fiber, such as whole grains, and fresh fruits and vegetables. Eating at least 5 servings of fruits and vegetables a day. Try to fill one-half of your plate with fruits and vegetables at each meal. Choosing lean protein foods, such as lean cuts of meat, poultry without skin, fish, tofu, beans, and nuts. Eating low-fat dairy products. Avoiding foods that are high in sodium. This can help lower blood pressure. Avoiding foods that have saturated fat, trans fat, and cholesterol. This can help prevent high cholesterol. Avoiding processed and prepared foods. Counting your daily carbohydrate intake.  Lifestyle If you drink alcohol: Limit how much you have to: 0-1 drink a day for women who are not pregnant. 0-2 drinks a day for men. Know how much alcohol is in your drink. In the [place] one drink equals one 12 oz bottle of beer ( ), one 5 oz glass of wine ( ), or one 1 oz glass of hard liquor (44mL). Do not use any products that contain nicotine  or tobacco. These products include cigarettes, chewing tobacco, and vaping devices, such as e-cigarettes. If you need help quitting, ask your health care provider. Avoid secondhand smoke. Do not use drugs. Activity  Try to stay at a healthy weight. Get at least 30 minutes of exercise on most days, such as: Fast walking. Biking. Swimming. Medicines Take over-the-counter and prescription medicines only as told by your health care provider. Aspirin  or blood thinners (antiplatelets or anticoagulants) may be recommended  to reduce your risk of forming blood clots that can lead to stroke. Avoid taking birth control pills. Talk to your health care provider about the risks of taking birth control pills if: You are over 28 years old. You smoke. You get very bad headaches. You have had a blood clot. Where to find more information American Stroke Association: www.strokeassociation.org Get help right away if: You or a loved one has any symptoms of a stroke. BE FAST is an easy way to remember the main warning signs of a stroke: B - Balance. Signs are dizziness, sudden trouble walking, or loss of balance. E - Eyes. Signs are trouble seeing or a sudden change in vision. F - Face. Signs are sudden weakness or numbness of the face, or the face or eyelid drooping on one side. A - Arms. Signs are weakness or numbness in an arm. This happens suddenly and usually on one side of the body. S - Speech. Signs are sudden trouble speaking, slurred speech, or trouble understanding what people say. T - Time. Time to call emergency services. Write down what time symptoms started. You or a loved one has other signs of a stroke, such as: A sudden, severe headache with no known cause. Nausea or vomiting. Seizure. These symptoms may represent a serious problem that is an emergency. Do not wait to see if the symptoms will go away. Get medical help right away. Call your local emergency services (911 in the [place] Do not drive yourself to the hospital. Summary You can help to prevent a stroke by eating healthy, exercising, not smoking, limiting alcohol intake,  and managing any medical conditions you may have. Do not use any products that contain nicotine  or tobacco. These include cigarettes, chewing tobacco, and vaping devices, such as e-cigarettes. If you need help quitting, ask your health care provider. Remember BE FAST for warning signs of a stroke. Get help right away if you or a loved one has any of these signs. This information  is not intended to replace advice given to you by your health care provider. Make sure you discuss any questions you have with your health care provider. Document Revised: 03/16/2022 Document Reviewed: 03/16/2022 Elsevier Patient Education  2024 [employer].

## 2024-04-06 ENCOUNTER — Ambulatory Visit: Payer: Self-pay | Admitting: Neurology

## 2024-04-06 LAB — LIPID PANEL
Chol/HDL Ratio: 2 ratio (ref 0.0–4.4)
Cholesterol, Total: 126 mg/dL (ref 100–199)
HDL: 62 mg/dL (ref >39)
LDL Chol Calc (NIH): 52 mg/dL (ref 0–99)
Triglycerides: 51 mg/dL (ref 0–149)
VLDL Cholesterol Cal: 12 mg/dL (ref 5–40)

## 2024-05-11 ENCOUNTER — Ambulatory Visit (HOSPITAL_BASED_OUTPATIENT_CLINIC_OR_DEPARTMENT_OTHER): Admitting: Cardiovascular Disease

## 2024-05-11 NOTE — Progress Notes (Unsigned)
 " Cardiology Office Note:  .   Date:  05/11/2024  ID:  Ruth Mullen, DOB [birth year], MRN [id number] PCP: Alcus Oneil ORN, FNP  [facility] Providers Cardiologist:  None { Click to update primary MD,subspecialty MD or APP then REFRESH:1}   History of Present Illness: .   Ruth Mullen is a 39 y.o. female with hypertension and stroke here to establish care.  She was admitted with stroke 01/2024.  She presented with sudden onset of left-sided weakness.  She reported having headaches for the weekend prior.  She was outside of the window for thrombolysis.  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  She underwent emergent mechanical thrombectomy with successful revascularization of the right MCA.  Echo was unremarkable.  Blood pressure was controlled in the hospital.  Discussed the use of AI scribe software for clinical note transcription with the patient, who gave verbal consent to proceed.  History of Present Illness     ROS:  As per HPI  Studies Reviewed: .       Echo 02/06/24: 1. Left ventricular ejection fraction, by estimation, is 60 to 65%. The  left ventricle has normal function. The left ventricle has no regional  wall motion abnormalities. Left ventricular diastolic parameters were  normal.   2. Right ventricular systolic function is normal. The right ventricular  size is normal. Tricuspid regurgitation signal is inadequate for assessing  PA pressure.   3. The mitral valve is normal in structure. Trivial mitral valve  regurgitation. No evidence of mitral stenosis.   4. The aortic valve is normal in structure. Aortic valve regurgitation is  not visualized. No aortic stenosis is present.   5. The inferior vena cava is normal in size with <50% respiratory  variability, suggesting right atrial pressure of 8 mmHg.   TEE 02/08/24:    1. Left ventricular ejection fraction, by estimation, is 60 to 65%. The  left  ventricle has normal function. The left ventricle has no regional  wall motion abnormalities.   2. Right ventricular systolic function is normal. The right ventricular  size is normal.   3. No left atrial/left atrial appendage thrombus was detected.   4. The mitral valve is grossly normal. Trivial mitral valve  regurgitation.   5. The aortic valve is tricuspid. Aortic valve regurgitation is not  visualized. No aortic stenosis is present.   6. Agitated saline contrast bubble study was positive with shunting  observed after >6 cardiac cycles suggestive of intrapulmonary shunting.  Risk Assessment/Calculations:   {Does this patient have ATRIAL FIBRILLATION?:[id number]} No BP recorded.  {Refresh Note OR Click here to enter BP  :1}***       Physical Exam:   VS:  There were no vitals taken for this visit. , BMI There is no height or weight on file to calculate BMI. GENERAL:  Well appearing HEENT: Pupils equal round and reactive, fundi not visualized, oral mucosa unremarkable NECK:  No jugular venous distention, waveform within normal limits, carotid upstroke brisk and symmetric, no bruits, no thyromegaly LYMPHATICS:  No cervical adenopathy LUNGS:  Clear to auscultation bilaterally HEART:  RRR.  PMI not displaced or sustained,S1 and S2 within normal limits, no S3, no S4, no clicks, no rubs, *** murmurs ABD:  Flat, positive bowel sounds normal in frequency in pitch, no bruits, no rebound, no guarding, no midline pulsatile mass, no hepatomegaly, no splenomegaly EXT:  2 plus pulses throughout, no edema, no cyanosis no clubbing  SKIN:  No rashes no nodules NEURO:  Cranial nerves II through XII grossly intact, motor grossly intact throughout PSYCH:  Cognitively intact, oriented to person place and time   ASSESSMENT AND PLAN: .   *** Assessment and Plan Assessment & Plan         {Are you ordering a CV Procedure (e.g. stress test, cath, DCCV, TEE, etc)?   Press F2        :[id number]}  Dispo:  ***  Signed, Annabella Scarce, MD   "

## 2024-05-16 ENCOUNTER — Other Ambulatory Visit: Payer: Self-pay | Admitting: Family Medicine

## 2024-05-16 DIAGNOSIS — G479 Sleep disorder, unspecified: Secondary | ICD-10-CM

## 2024-05-18 ENCOUNTER — Telehealth: Payer: Self-pay

## 2024-05-18 NOTE — Patient Outreach (Signed)
 First telephone outreach attempt to obtain mRS. No answer. Left message for returned call.  [facility]  Population Health Care Management Assistant  Direct Dial: [phone]  Fax: [phone] Website: [email or web address]

## 2024-05-22 ENCOUNTER — Telehealth: Payer: Self-pay

## 2024-05-22 NOTE — Patient Outreach (Signed)
 Second telephone outreach attempt to obtain mRS. No answer. Left message for returned call.  Shereen Saunders [facility]  Population Health Care Management Assistant  Direct Dial: [phone]  Fax: [phone] Website: [email or web address]

## 2024-05-23 ENCOUNTER — Telehealth: Payer: Self-pay

## 2024-05-23 NOTE — Patient Outreach (Signed)
 3 outreach attempts were completed to obtain mRs. mRs could not be obtained because patient never returned my calls. mRs=7    Shereen Gin [facility] Management Assistant  Direct Dial: [phone]  Fax: [phone] Website: [email or web address]

## 2024-06-12 ENCOUNTER — Encounter: Admitting: Adult Health

## 2024-06-15 ENCOUNTER — Encounter: Payer: Self-pay | Admitting: Family Medicine

## 2024-10-09 ENCOUNTER — Ambulatory Visit: Admitting: Adult Health
